# Patient Record
Sex: Male | Born: 1995 | Race: White | Hispanic: No | Marital: Single | Smoking: Current every day smoker
Health system: Southern US, Community
[De-identification: ages and names within clinical notes are randomized; demographics above are authoritative.]

## PROBLEM LIST (undated history)

## (undated) ENCOUNTER — Emergency Department (HOSPITAL_COMMUNITY): Payer: Self-pay

## (undated) ENCOUNTER — Other Ambulatory Visit (HOSPITAL_COMMUNITY): Admission: EM

## (undated) DIAGNOSIS — F1110 Opioid abuse, uncomplicated: Secondary | ICD-10-CM

## (undated) DIAGNOSIS — F1990 Other psychoactive substance use, unspecified, uncomplicated: Secondary | ICD-10-CM

## (undated) DIAGNOSIS — F1410 Cocaine abuse, uncomplicated: Secondary | ICD-10-CM

## (undated) DIAGNOSIS — F1910 Other psychoactive substance abuse, uncomplicated: Secondary | ICD-10-CM

## (undated) DIAGNOSIS — Z22322 Carrier or suspected carrier of Methicillin resistant Staphylococcus aureus: Secondary | ICD-10-CM

## (undated) DIAGNOSIS — Z720 Tobacco use: Secondary | ICD-10-CM

---

## 2017-03-05 ENCOUNTER — Emergency Department (HOSPITAL_COMMUNITY): Payer: Medicaid Other

## 2017-03-05 ENCOUNTER — Inpatient Hospital Stay (HOSPITAL_COMMUNITY)
Admission: EM | Admit: 2017-03-05 | Discharge: 2017-03-06 | DRG: 872 | Discharge status: Against Medical Advice | Payer: Medicaid Other | Attending: Internal Medicine | Admitting: Internal Medicine

## 2017-03-05 ENCOUNTER — Encounter (HOSPITAL_COMMUNITY): Payer: Self-pay | Admitting: Emergency Medicine

## 2017-03-05 DIAGNOSIS — F1410 Cocaine abuse, uncomplicated: Secondary | ICD-10-CM | POA: Diagnosis present

## 2017-03-05 DIAGNOSIS — M60009 Infective myositis, unspecified site: Secondary | ICD-10-CM | POA: Diagnosis present

## 2017-03-05 DIAGNOSIS — F1110 Opioid abuse, uncomplicated: Secondary | ICD-10-CM | POA: Diagnosis present

## 2017-03-05 DIAGNOSIS — L02213 Cutaneous abscess of chest wall: Secondary | ICD-10-CM

## 2017-03-05 DIAGNOSIS — Z720 Tobacco use: Secondary | ICD-10-CM

## 2017-03-05 DIAGNOSIS — F1990 Other psychoactive substance use, unspecified, uncomplicated: Secondary | ICD-10-CM | POA: Diagnosis present

## 2017-03-05 DIAGNOSIS — F419 Anxiety disorder, unspecified: Secondary | ICD-10-CM | POA: Diagnosis present

## 2017-03-05 DIAGNOSIS — R6510 Systemic inflammatory response syndrome (SIRS) of non-infectious origin without acute organ dysfunction: Secondary | ICD-10-CM | POA: Diagnosis present

## 2017-03-05 DIAGNOSIS — M6008 Infective myositis, other site: Secondary | ICD-10-CM | POA: Diagnosis present

## 2017-03-05 DIAGNOSIS — Z833 Family history of diabetes mellitus: Secondary | ICD-10-CM

## 2017-03-05 DIAGNOSIS — A419 Sepsis, unspecified organism: Principal | ICD-10-CM | POA: Diagnosis present

## 2017-03-05 DIAGNOSIS — R911 Solitary pulmonary nodule: Secondary | ICD-10-CM | POA: Diagnosis present

## 2017-03-05 DIAGNOSIS — Z8249 Family history of ischemic heart disease and other diseases of the circulatory system: Secondary | ICD-10-CM

## 2017-03-05 DIAGNOSIS — F17200 Nicotine dependence, unspecified, uncomplicated: Secondary | ICD-10-CM | POA: Diagnosis present

## 2017-03-05 DIAGNOSIS — L03313 Cellulitis of chest wall: Secondary | ICD-10-CM | POA: Diagnosis present

## 2017-03-05 HISTORY — DX: Other psychoactive substance use, unspecified, uncomplicated: F19.90

## 2017-03-05 HISTORY — DX: Tobacco use: Z72.0

## 2017-03-05 HISTORY — DX: Cocaine abuse, uncomplicated: F14.10

## 2017-03-05 HISTORY — DX: Opioid abuse, uncomplicated: F11.10

## 2017-03-05 LAB — COMPREHENSIVE METABOLIC PANEL
ALBUMIN: 3.6 g/dL (ref 3.5–5.0)
ALK PHOS: 85 U/L (ref 38–126)
ALT: 12 U/L — AB (ref 17–63)
AST: 13 U/L — AB (ref 15–41)
Anion gap: 13 (ref 5–15)
BUN: 6 mg/dL (ref 6–20)
CALCIUM: 8.6 mg/dL — AB (ref 8.9–10.3)
CHLORIDE: 96 mmol/L — AB (ref 101–111)
CO2: 24 mmol/L (ref 22–32)
CREATININE: 0.65 mg/dL (ref 0.61–1.24)
GFR calc non Af Amer: >60 mL/min (ref >60)
Glucose, Bld: 154 mg/dL — ABNORMAL HIGH (ref 65–99)
Potassium: 4.2 mmol/L (ref 3.5–5.1)
SODIUM: 133 mmol/L — AB (ref 135–145)
Total Bilirubin: 0.5 mg/dL (ref 0.3–1.2)
Total Protein: 7.5 g/dL (ref 6.5–8.1)

## 2017-03-05 LAB — RAPID URINE DRUG SCREEN, HOSP PERFORMED
Amphetamines: NOT DETECTED
BARBITURATES: NOT DETECTED
BENZODIAZEPINES: NOT DETECTED
COCAINE: POSITIVE — AB
OPIATES: NOT DETECTED
TETRAHYDROCANNABINOL: NOT DETECTED

## 2017-03-05 LAB — URINALYSIS, ROUTINE W REFLEX MICROSCOPIC
BACTERIA UA: NONE SEEN
BILIRUBIN URINE: NEGATIVE
Glucose, UA: NEGATIVE mg/dL
KETONES UR: NEGATIVE mg/dL
LEUKOCYTES UA: NEGATIVE
Nitrite: NEGATIVE
PH: 6 (ref 5.0–8.0)
Protein, ur: NEGATIVE mg/dL
SPECIFIC GRAVITY, URINE: 1.017 (ref 1.005–1.030)
SQUAMOUS EPITHELIAL / LPF: NONE SEEN

## 2017-03-05 LAB — CBC WITH DIFFERENTIAL/PLATELET
Basophils Absolute: 0 10*3/uL (ref 0.0–0.1)
Basophils Relative: 0 %
EOS ABS: 0.1 10*3/uL (ref 0.0–0.7)
EOS PCT: 1 %
HCT: 38.1 % — ABNORMAL LOW (ref 39.0–52.0)
Hemoglobin: 12.9 g/dL — ABNORMAL LOW (ref 13.0–17.0)
LYMPHS ABS: 1.4 10*3/uL (ref 0.7–4.0)
LYMPHS PCT: 9 %
MCH: 29.3 pg (ref 26.0–34.0)
MCHC: 33.9 g/dL (ref 30.0–36.0)
MCV: 86.4 fL (ref 78.0–100.0)
MONO ABS: 1.2 10*3/uL — AB (ref 0.1–1.0)
MONOS PCT: 7 %
Neutro Abs: 13.1 10*3/uL — ABNORMAL HIGH (ref 1.7–7.7)
Neutrophils Relative %: 83 %
PLATELETS: 237 10*3/uL (ref 150–400)
RBC: 4.41 MIL/uL (ref 4.22–5.81)
RDW: 12.7 % (ref 11.5–15.5)
WBC: 15.8 10*3/uL — ABNORMAL HIGH (ref 4.0–10.5)

## 2017-03-05 LAB — ETHANOL: Alcohol, Ethyl (B): <10 mg/dL (ref <10)

## 2017-03-05 LAB — I-STAT CG4 LACTIC ACID, ED: LACTIC ACID, VENOUS: 1.45 mmol/L (ref 0.5–1.9)

## 2017-03-05 MED ORDER — IOPAMIDOL (ISOVUE-300) INJECTION 61%
INTRAVENOUS | Status: AC
  Administered 2017-03-06: 75 mL via INTRAVENOUS
  Filled 2017-03-05: qty 75

## 2017-03-05 MED ORDER — KETOROLAC TROMETHAMINE 30 MG/ML IJ SOLN
30.0000 mg | Freq: Once | INTRAMUSCULAR | Status: AC
  Administered 2017-03-05: 30 mg via INTRAMUSCULAR
  Filled 2017-03-05: qty 1

## 2017-03-05 MED ORDER — KETOROLAC TROMETHAMINE 30 MG/ML IJ SOLN: 30.0000 mg | Freq: Once | INTRAMUSCULAR | Status: DC

## 2017-03-05 MED ORDER — HYDROMORPHONE HCL 1 MG/ML IJ SOLN
1.0000 mg | Freq: Once | INTRAMUSCULAR | Status: AC
  Administered 2017-03-05: 1 mg via INTRAVENOUS
  Filled 2017-03-05: qty 1

## 2017-03-05 MED ORDER — SODIUM CHLORIDE 0.9 % IV BOLUS (SEPSIS)
1000.0000 mL | Freq: Once | INTRAVENOUS | Status: AC
  Administered 2017-03-05: 1000 mL via INTRAVENOUS

## 2017-03-05 MED ORDER — SODIUM CHLORIDE 0.9 % IV BOLUS (SEPSIS)
1000.0000 mL | Freq: Once | INTRAVENOUS | Status: AC
  Administered 2017-03-06: 1000 mL via INTRAVENOUS

## 2017-03-05 MED ORDER — ACETAMINOPHEN 325 MG PO TABS
650.0000 mg | ORAL_TABLET | Freq: Once | ORAL | Status: AC
  Administered 2017-03-05: 650 mg via ORAL
  Filled 2017-03-05: qty 2

## 2017-03-05 NOTE — ED Provider Notes (Signed)
Left chest swelling, painful IVDA, last was PTA Toradol while here No fever, 99.9 Leukocytosis of 15  Pending CT chest with cm Has received 1 mg Dilaudid If negative, home with ibuprofen Referral to PCP Doxy or Clinda to go home with  CT results c/w early intramuscular abscess left pectoralis and cellulitis of left chest wall. Discussed the results with the patient. Vancomycin ordered. The patient has refused blood culture draw, "I don't want to get stuck anymore".   Discussed admission with [facility], Dr. Blaine Hamper, who accepts the patient onto his service.     Charlann Lange, PA-C 03/06/17 Margaretha Glassing    Charlesetta Shanks, MD 03/16/17 [phone]

## 2017-03-05 NOTE — ED Notes (Signed)
This nurse attempted x2 PIV sticks and was unsuccessful. Patient refuses to let this nurse stick again. EDPA notified. Ultrasound IV team consulted.

## 2017-03-05 NOTE — ED Notes (Signed)
Patient angry and yelling in his room. This nurse heard patient stating "theyre being assholes! Im in pain and they damnwell know it and theyre not giving me any pain meds!"

## 2017-03-05 NOTE — ED Notes (Signed)
EDPA at patient's bedside.

## 2017-03-05 NOTE — ED Provider Notes (Signed)
[facility] DEPT Provider Note   CSN: [id number] Arrival date & time: 03/05/17  1603     History   Chief Complaint Chief Complaint  Patient presents with  . pulled muscle/muscle pain    HPI Jonathen Rathman is a 21 y.o. male presenting with 2 days of left-sided pectoral swelling, erythema and pain. Patient is a active heroine IV drug user with last use an hour prior to arrival. He reports subjective fevers and chills and progressive worsening of the swelling and pain. He denies any trauma or injury to the area. No cold symptoms, cough, nausea, vomiting or any other symptoms. denies any trauma or injury to the area.  HPI  No past medical history on file.  There are no active problems to display for this patient.   No past surgical history on file.     Home Medications    Prior to Admission medications   Medication Sig Start Date End Date Taking? Authorizing Provider  naproxen sodium (ANAPROX) 220 MG tablet Take 880 mg by mouth 2 (two) times daily with a meal.   Yes [provider]    Family History No family history on file.  Social History Social History  Substance Use Topics  . Smoking status: Not on file  . Smokeless tobacco: Not on file  . Alcohol use Not on file     Allergies   Penicillins   Review of Systems Review of Systems  Constitutional: Positive for chills and fever.  HENT: Negative for congestion.   Respiratory: Negative for cough, choking, chest tightness, shortness of breath, wheezing and stridor.   Cardiovascular: Positive for chest pain. Negative for palpitations.       Left-sided chest wall pain  Gastrointestinal: Negative for nausea and vomiting.  Musculoskeletal: Positive for myalgias.  Skin: Positive for color change. Negative for pallor, rash and wound.       Erythema and swelling to the left pectoral area  Psychiatric/Behavioral: Positive for agitation.     Physical Exam Updated Vital  Signs BP 135/74 (BP Location: Left Arm)   Pulse (!) 103   Temp 99.9 F (37.7 C) (Oral)   Resp 20   Ht 5\' 4"  (1.626 m)   SpO2 99%   Physical Exam  Constitutional: He appears well-developed and well-nourished. No distress.  Patient was agitated and screaming at nursing staff in the hallway. He was calmer and cooperative on my assessment. Is afebrile nontoxic  HENT:  Head: Normocephalic and atraumatic.  Eyes: Conjunctivae and EOM are normal.  Neck: Normal range of motion.  Cardiovascular: Normal rate, regular rhythm, normal heart sounds and intact distal pulses.   No murmur heard. Pulmonary/Chest: Effort normal and breath sounds normal. No respiratory distress. He has no wheezes. He has no rales. He exhibits tenderness.  Tenderness palpation of the left chest wall and sternum, no crepitus appreciated  Musculoskeletal: Normal range of motion. He exhibits edema.  Neurological: He is alert.  Skin: Skin is warm and dry. He is not diaphoretic. There is erythema.  Warmth, erythema and edema to the left chest wall, no central area of fluctuance suggesting a superficial abscess amenable to I&D.  Psychiatric: He has a normal mood and affect.  Nursing note and vitals reviewed.    ED Treatments / Results  Labs (all labs ordered are listed, but only abnormal results are displayed) Labs Reviewed  COMPREHENSIVE METABOLIC PANEL - Abnormal; Notable for the following:       Result Value   Sodium  133 (*)    Chloride 96 (*)    Glucose, Bld 154 (*)    Calcium 8.6 (*)    AST 13 (*)    ALT 12 (*)    All other components within normal limits  CBC WITH DIFFERENTIAL/PLATELET - Abnormal; Notable for the following:    WBC 15.8 (*)    Hemoglobin 12.9 (*)    HCT 38.1 (*)    Neutro Abs 13.1 (*)    Monocytes Absolute 1.2 (*)    All other components within normal limits  URINALYSIS, ROUTINE W REFLEX MICROSCOPIC - Abnormal; Notable for the following:    Hgb urine dipstick LARGE (*)    All other  components within normal limits  RAPID URINE DRUG SCREEN, HOSP PERFORMED - Abnormal; Notable for the following:    Cocaine POSITIVE (*)    All other components within normal limits  CULTURE, BLOOD (ROUTINE X 2)  CULTURE, BLOOD (ROUTINE X 2)  ETHANOL  I-STAT CG4 LACTIC ACID, ED    EKG  EKG Interpretation None       Radiology Dg Chest 2 View  Result Date: 03/05/2017 CLINICAL DATA:  Chest pain EXAM: CHEST  2 VIEW COMPARISON:  None. FINDINGS: Lungs are clear. Heart size and pulmonary vascularity are normal. No adenopathy. No pneumothorax. No bone lesions. IMPRESSION: No edema or consolidation. Electronically Signed   By: Lowella Grip III M.D.   On: 03/05/2017 19:09   Dg Sternum  Result Date: 03/05/2017 CLINICAL DATA:  Pain after twisting injury.  Heroin user. EXAM: STERNUM - 1+ VIEW COMPARISON:  Chest radiograph of earlier today. FINDINGS: Single lateral view of the sternum demonstrates no focal osseous abnormality. No retrosternal soft tissue mass. No subcutaneous gas or osseous destruction. IMPRESSION: No acute osseous abnormality. Electronically Signed   By: Abigail Miyamoto M.D.   On: 03/05/2017 19:28    Procedures Procedures (including critical care time)  Medications Ordered in ED Medications  iopamidol (ISOVUE-300) 61 % injection (not administered)  sodium chloride 0.9 % bolus 1,000 mL (not administered)  ketorolac (TORADOL) 30 MG/ML injection 30 mg (30 mg Intramuscular Given 03/05/17 1921)  HYDROmorphone (DILAUDID) injection 1 mg (1 mg Intravenous Given 03/05/17 2328)  sodium chloride 0.9 % bolus 1,000 mL (1,000 mLs Intravenous New Bag/Given 03/05/17 2327)  acetaminophen (TYLENOL) tablet 650 mg (650 mg Oral Given 03/05/17 2343)     Initial Impression / Assessment and Plan / ED Course  I have reviewed the triage vital signs and the nursing notes.  Pertinent labs & imaging results that were available during my care of the patient were reviewed by me and considered in  my medical decision making (see chart for details).    Patient presenting with 2 days of left pectoral pain, swelling and erythema. He is agited and screaming at staff. Requesting IV analgesia  Obtain labs, chest x-ray and reevaluate Patient was given Toradol IM and reported improvement  Negative chest and sternum films  Leukocytosis at 15.8. Discussed with Dr. Johnney Killian and CT chest with contrast ordered. Patient started to experience pain recurrence and was provided analgesia. Patient was discussed with Dr. Johnney Killian who has also evaluated patient and agrees with assessment and plan.  Transferred patient care at end of shift to Charlann Lange, PA-C pending CT results and disposition. Dissipate discharge home with antibiotics and ibuprofen if CT negative. Treat as appropriate if significant findings on CT.  Final Clinical Impressions(s) / ED Diagnoses   Final diagnoses:  None    New Prescriptions New  Prescriptions   No medications on file     Dossie Der 03/05/17 [place], Icard, MD 03/16/17 [phone]

## 2017-03-05 NOTE — ED Notes (Signed)
Patient called this nurse into the room, screaming, stating "Im in pain and you all arent doing a damn thing about it! Give me pain meds already!" This nurse explained the process of the ER, and stated the EDPA would be seeing the patient as soon as she was able, but the patient remains agitated. Throwing his blanket on the floor, patient screams and yells at this nurse to "Get the fuck out of my room!"

## 2017-03-05 NOTE — ED Notes (Signed)
This nurse heard patient screaming in his room and entered the room. Patient screams, "Give me a phone! Im calling a damn lawyer right now! This shit is insane! Yall just need to give me some pain meds and you wont even do that! I hate yall!" This nurse reminded the patient that he could not be screaming, as he was not the only patient in this area of the ER and that he was disturbing other patients. The patient then screams "I dont fucking care! They dont hurt as bad as I do! They dont fucking know what Im going through! They can fuck off!" The patient asked for pain medication again, and this nurse reminded the patient that I cannot order pain medications for the patient without a physician order. The patient again got very agitated and screamed for this nurse to "Get out! I fucking hate yall!"

## 2017-03-05 NOTE — ED Triage Notes (Signed)
Per EMS-states he threw an orange and is now having right pectorial pain and inflammation he is a heroin user and last use was over a hour ago

## 2017-03-06 ENCOUNTER — Encounter (HOSPITAL_COMMUNITY): Payer: Self-pay

## 2017-03-06 DIAGNOSIS — F1110 Opioid abuse, uncomplicated: Secondary | ICD-10-CM | POA: Diagnosis present

## 2017-03-06 DIAGNOSIS — L02213 Cutaneous abscess of chest wall: Secondary | ICD-10-CM | POA: Diagnosis present

## 2017-03-06 DIAGNOSIS — F419 Anxiety disorder, unspecified: Secondary | ICD-10-CM | POA: Diagnosis present

## 2017-03-06 DIAGNOSIS — F1990 Other psychoactive substance use, unspecified, uncomplicated: Secondary | ICD-10-CM | POA: Diagnosis not present

## 2017-03-06 DIAGNOSIS — M60009 Infective myositis, unspecified site: Secondary | ICD-10-CM | POA: Diagnosis present

## 2017-03-06 DIAGNOSIS — R6510 Systemic inflammatory response syndrome (SIRS) of non-infectious origin without acute organ dysfunction: Secondary | ICD-10-CM | POA: Diagnosis present

## 2017-03-06 DIAGNOSIS — Z833 Family history of diabetes mellitus: Secondary | ICD-10-CM | POA: Diagnosis not present

## 2017-03-06 DIAGNOSIS — R911 Solitary pulmonary nodule: Secondary | ICD-10-CM | POA: Diagnosis present

## 2017-03-06 DIAGNOSIS — F1410 Cocaine abuse, uncomplicated: Secondary | ICD-10-CM | POA: Diagnosis present

## 2017-03-06 DIAGNOSIS — A419 Sepsis, unspecified organism: Principal | ICD-10-CM

## 2017-03-06 DIAGNOSIS — M6008 Infective myositis, other site: Secondary | ICD-10-CM | POA: Diagnosis present

## 2017-03-06 DIAGNOSIS — F17200 Nicotine dependence, unspecified, uncomplicated: Secondary | ICD-10-CM | POA: Diagnosis present

## 2017-03-06 DIAGNOSIS — L03313 Cellulitis of chest wall: Secondary | ICD-10-CM | POA: Diagnosis present

## 2017-03-06 DIAGNOSIS — Z720 Tobacco use: Secondary | ICD-10-CM | POA: Diagnosis present

## 2017-03-06 DIAGNOSIS — Z8249 Family history of ischemic heart disease and other diseases of the circulatory system: Secondary | ICD-10-CM | POA: Diagnosis not present

## 2017-03-06 DIAGNOSIS — R509 Fever, unspecified: Secondary | ICD-10-CM | POA: Diagnosis present

## 2017-03-06 LAB — BASIC METABOLIC PANEL
Anion gap: 9 (ref 5–15)
CALCIUM: 8.3 mg/dL — AB (ref 8.9–10.3)
CO2: 24 mmol/L (ref 22–32)
Chloride: 102 mmol/L (ref 101–111)
Creatinine, Ser: 0.6 mg/dL — ABNORMAL LOW (ref 0.61–1.24)
GFR calc Af Amer: >60 mL/min (ref >60)
GLUCOSE: 125 mg/dL — AB (ref 65–99)
Potassium: 4.3 mmol/L (ref 3.5–5.1)
Sodium: 135 mmol/L (ref 135–145)

## 2017-03-06 MED ORDER — HYDROXYZINE HCL 25 MG PO TABS
25.0000 mg | ORAL_TABLET | Freq: Four times a day (QID) | ORAL | Status: DC | PRN
  Administered 2017-03-06: 25 mg via ORAL
  Filled 2017-03-06: qty 1

## 2017-03-06 MED ORDER — VANCOMYCIN HCL IN DEXTROSE 1-5 GM/200ML-% IV SOLN
1000.0000 mg | Freq: Once | INTRAVENOUS | Status: AC
  Administered 2017-03-06: 1000 mg via INTRAVENOUS
  Filled 2017-03-06: qty 200

## 2017-03-06 MED ORDER — LORAZEPAM 2 MG/ML IJ SOLN
0.5000 mg | Freq: Four times a day (QID) | INTRAMUSCULAR | Status: DC | PRN
  Administered 2017-03-06: 0.5 mg via INTRAVENOUS
  Filled 2017-03-06: qty 1

## 2017-03-06 MED ORDER — IOPAMIDOL (ISOVUE-300) INJECTION 61%
75.0000 mL | Freq: Once | INTRAVENOUS | Status: AC | PRN
  Administered 2017-03-06: 75 mL via INTRAVENOUS

## 2017-03-06 MED ORDER — VANCOMYCIN HCL IN DEXTROSE 1-5 GM/200ML-% IV SOLN
1000.0000 mg | Freq: Three times a day (TID) | INTRAVENOUS | Status: DC
  Administered 2017-03-06: 1000 mg via INTRAVENOUS
  Filled 2017-03-06: qty 200

## 2017-03-06 MED ORDER — DEXTROSE 5 % IV SOLN
2.0000 g | Freq: Three times a day (TID) | INTRAVENOUS | Status: DC
  Administered 2017-03-06: 2 g via INTRAVENOUS
  Filled 2017-03-06 (×2): qty 2

## 2017-03-06 MED ORDER — KETOROLAC TROMETHAMINE 30 MG/ML IJ SOLN
30.0000 mg | Freq: Four times a day (QID) | INTRAMUSCULAR | Status: DC | PRN
  Administered 2017-03-06 (×3): 30 mg via INTRAVENOUS
  Filled 2017-03-06 (×3): qty 1

## 2017-03-06 MED ORDER — AZTREONAM 2 G IJ SOLR
2.0000 g | Freq: Once | INTRAMUSCULAR | Status: AC
  Administered 2017-03-06: 2 g via INTRAVENOUS
  Filled 2017-03-06: qty 2

## 2017-03-06 MED ORDER — NICOTINE 21 MG/24HR TD PT24: 21.0000 mg | MEDICATED_PATCH | Freq: Every day | TRANSDERMAL | Status: DC

## 2017-03-06 MED ORDER — MORPHINE SULFATE (PF) 4 MG/ML IV SOLN
INTRAVENOUS | Status: AC
  Filled 2017-03-06: qty 1

## 2017-03-06 MED ORDER — ACETAMINOPHEN 650 MG RE SUPP: 650.0000 mg | Freq: Four times a day (QID) | RECTAL | Status: DC | PRN

## 2017-03-06 MED ORDER — OXYCODONE-ACETAMINOPHEN 5-325 MG PO TABS
1.0000 | ORAL_TABLET | ORAL | Status: DC | PRN
  Administered 2017-03-06 (×2): 1 via ORAL
  Filled 2017-03-06 (×2): qty 1

## 2017-03-06 MED ORDER — SULFAMETHOXAZOLE-TRIMETHOPRIM 800-160 MG PO TABS: 1.0000 | ORAL_TABLET | Freq: Two times a day (BID) | ORAL | 0 refills | Status: DC

## 2017-03-06 MED ORDER — ACETAMINOPHEN 325 MG PO TABS: 650.0000 mg | ORAL_TABLET | Freq: Four times a day (QID) | ORAL | Status: DC | PRN

## 2017-03-06 MED ORDER — MORPHINE SULFATE (PF) 4 MG/ML IV SOLN
4.0000 mg | Freq: Once | INTRAVENOUS | Status: AC
  Administered 2017-03-06: 4 mg via INTRAVENOUS

## 2017-03-06 MED ORDER — ZOLPIDEM TARTRATE 5 MG PO TABS: 5.0000 mg | ORAL_TABLET | Freq: Every evening | ORAL | Status: DC | PRN

## 2017-03-06 MED ORDER — ONDANSETRON HCL 4 MG/2ML IJ SOLN: 4.0000 mg | Freq: Three times a day (TID) | INTRAMUSCULAR | Status: DC | PRN

## 2017-03-06 MED ORDER — ENOXAPARIN SODIUM 40 MG/0.4ML ~~LOC~~ SOLN
40.0000 mg | SUBCUTANEOUS | Status: DC
  Filled 2017-03-06: qty 0.4

## 2017-03-06 MED ORDER — SODIUM CHLORIDE 0.9 % IV SOLN
INTRAVENOUS | Status: DC
  Administered 2017-03-06 (×3): via INTRAVENOUS

## 2017-03-06 MED ORDER — HYDROMORPHONE HCL 1 MG/ML IJ SOLN
1.0000 mg | Freq: Once | INTRAMUSCULAR | Status: DC
  Administered 2017-03-06: 1 mg via INTRAVENOUS
  Filled 2017-03-06: qty 1

## 2017-03-06 NOTE — Progress Notes (Signed)
Pharmacy Antibiotic Note  Joseph Nunez is a 21 y.o. male with 2 days of left-sided pectoral swelling, erythema and pain admitted on 03/05/2017 with sepsis d/t abscess/cellulitis Pharmacy has been consulted for azactam and vancomycin dosing.  Plan: Azactam 2 Gm IV q8h Vancomycin 1 Gm IV q8h F/u scr/cultures/levels  Height: 5\' 4"  (162.6 cm) IBW/kg (Calculated) : 59.2  Temp (24hrs), Avg:99.9 F (37.7 C), Min:99.9 F (37.7 C), Max:99.9 F (37.7 C)   Recent Labs Lab 03/05/17 2015 03/05/17 2025  WBC 15.8*  --   CREATININE 0.65  --   LATICACIDVEN  --  1.45    CrCl cannot be calculated (Unknown ideal weight.).    Allergies  Allergen Reactions  . Penicillins     Has patient had a PCN reaction causing immediate rash, facial/tongue/throat swelling, SOB or lightheadedness with hypotension:yHas patient had a PCN reace cassing severe rash involving mucus membranes or skin necrosis: no Has patient had a PCN reaction that required hospitalization yes Has patient had a PCN reaction occurring within the last 10 years: no If all of the above answers are "NO", then may proceed with Cephalosporin use.     Antimicrobials this admission: 10/17 azactam >>  10/17 vancomycin >>   Dose adjustments this admission:   Microbiology results:  BCx:   UCx:    Sputum:    MRSA PCR:   Thank you for allowing pharmacy to be a part of this patient's care.  Dorrene German 03/06/2017 2:02 AM

## 2017-03-06 NOTE — ED Notes (Signed)
Pt given apple juice and orange juice  ED provider in talking with pt

## 2017-03-06 NOTE — ED Notes (Signed)
Pt still requesting more pain medication. Wants pain medication every hour. Have spoken with hospitalist about this request. Pt was given one time dose of morphine for pain. I have offered ativan on multiple occasions this am, but pt has refused every time. Informed pt that tordol and percocet can be given every 6 hours PRN. Have informed pt on multiple occasions that next dose of tordol can be given at 0930.

## 2017-03-06 NOTE — Progress Notes (Signed)
PROGRESS NOTE                                                                                                                                                                                                             Patient Demographics:    Joseph Nunez, is a 21 y.o. male, DOB - Jul 02, 1995, FHQ:[id number]  Admit date - 03/05/2017   Admitting Physician Ivor Costa, MD  Outpatient Primary MD for the patient is Patient, No Pcp Per  LOS - 0   Chief Complaint  Patient presents with  . pulled muscle/muscle pain       Brief Narrative   This is in a charge note for patient admitted earlier today by Dr. Soledad Gerlach   Subjective:    Oletha Blend today has, No headache, complains of left chest wall pain    Assessment  & Plan :    Principal Problem:   Cellulitis of pectoral region Active Problems:   Heroin abuse ([place])   IVDU (intravenous drug user)   Sepsis ([place])   Pyomyositis   Cocaine abuse ([place])   Tobacco abuse    Sepsis due to cellulitis and pyomyositis  of pectoral region:  - patient admits criteria for sepsis with fever, leukocytosis, tachycardia and tachypnea.lactic acid is normal. Hemodynamically stable. -Patient is IV drug user, will needs antibiotics with broad coverage.continue with IV vancomycin and aztreonam, continue to follow blood cultures, continue with IV fluids - follow on ESR and CRP -check HIV  ab  Polysubstance abuse: -patient reports heroin use, but urine drug screen is positive for cocaine, he reports history of IV drug abuse, he was counseled - He was started on nicotine patch, patient started smoking when he got into his room on the fourth floor, was counseled and educated -When necessary Ativan And atarax  for anxiety  4 mm left upper lobe subpleural nodule:   -Patient has this incidental finding on CT of the chest -follow-up with PCP    Code Status : Full  Family Communication  : None at  bedside  Disposition Plan  : Home when stable  Consults  :  none  Procedures  : none  DVT Prophylaxis  :  Lovenox  Lab Results  Component Value Date   PLT 237 03/05/2017    Antibiotics  :    Anti-infectives  Start     Dose/Rate Route Frequency Ordered Stop   03/06/17 1000  vancomycin (VANCOCIN) IVPB 1000 mg/200 mL premix     1,000 mg 200 mL/hr over 60 Minutes Intravenous Every 8 hours 03/06/17 0315     03/06/17 1000  aztreonam (AZACTAM) 2 g in dextrose 5 % 50 mL IVPB     2 g 100 mL/hr over 30 Minutes Intravenous Every 8 hours 03/06/17 0317     03/06/17 0200  aztreonam (AZACTAM) 2 g in dextrose 5 % 50 mL IVPB     2 g 100 mL/hr over 30 Minutes Intravenous  Once 03/06/17 0158 03/06/17 0345   03/06/17 0130  vancomycin (VANCOCIN) IVPB 1000 mg/200 mL premix     1,000 mg 200 mL/hr over 60 Minutes Intravenous  Once 03/06/17 0116 03/06/17 0312        Objective:   Vitals:   03/06/17 1034 03/06/17 1130 03/06/17 1212 03/06/17 1421  BP:   113/86 119/75  Pulse:   (!) 110 (!) 101  Resp:  (!) 28 (!) 22 18  Temp: 99.4 F (37.4 C)     TempSrc: Oral   Oral  SpO2:   100% 100%  Weight:      Height:        Wt Readings from Last 3 Encounters:  03/06/17 65.8 kg (145 lb)     Intake/Output Summary (Last 24 hours) at 03/06/17 1533 Last data filed at 03/06/17 1034  Gross per 24 hour  Intake              200 ml  Output              600 ml  Net             -400 ml     Physical Exam  Awake Alert, Oriented X 3 Symmetrical Chest wall movement, Good air movement bilaterally, CTAB RRR,No Gallops,Rubs or new Murmurs, No Parasternal Heave, minimal erythema and swelling in the left upper chest wall +ve B.Sounds, Abd Soft, No tenderness,No rebound - guarding or rigidity. No Cyanosis, Clubbing or edema, No new Rash or bruise     Data Review:    CBC  Recent Labs Lab 03/05/17 2015  WBC 15.8*  HGB 12.9*  HCT 38.1*  PLT 237  MCV 86.4  MCH 29.3  MCHC 33.9  RDW 12.7   LYMPHSABS 1.4  MONOABS 1.2*  EOSABS 0.1  BASOSABS 0.0    Chemistries   Recent Labs Lab 03/05/17 2015 03/06/17 0515  NA 133* 135  K 4.2 4.3  CL 96* 102  CO2 24 24  GLUCOSE 154* 125*  BUN 6 <5*  CREATININE 0.65 0.60*  CALCIUM 8.6* 8.3*  AST 13*  --   ALT 12*  --   ALKPHOS 85  --   BILITOT 0.5  --    ------------------------------------------------------------------------------------------------------------------ No results for input(s): CHOL, HDL, LDLCALC, TRIG, CHOLHDL, LDLDIRECT in the last 72 hours.  No results found for: HGBA1C ------------------------------------------------------------------------------------------------------------------ No results for input(s): TSH, T4TOTAL, T3FREE, THYROIDAB in the last 72 hours.  Invalid input(s): FREET3 ------------------------------------------------------------------------------------------------------------------ No results for input(s): VITAMINB12, FOLATE, FERRITIN, TIBC, IRON, RETICCTPCT in the last 72 hours.  Coagulation profile No results for input(s): INR, PROTIME in the last 168 hours.  No results for input(s): DDIMER in the last 72 hours.  Cardiac Enzymes No results for input(s): CKMB, TROPONINI, MYOGLOBIN in the last 168 hours.  Invalid input(s): CK ------------------------------------------------------------------------------------------------------------------ No results found for: BNP  Inpatient Medications  Scheduled Meds: .  enoxaparin (LOVENOX) injection  40 mg Subcutaneous Q24H  . morphine      . nicotine  21 mg Transdermal Daily   Continuous Infusions: . sodium chloride 150 mL/hr at 03/06/17 1431  . aztreonam    . vancomycin Stopped (03/06/17 1034)   PRN Meds:.acetaminophen **OR** acetaminophen, hydrOXYzine, ketorolac, LORazepam, ondansetron, oxyCODONE-acetaminophen, zolpidem  Micro Results No results found for this or any previous visit (from the past 240 hour(s)).  Radiology Reports Dg  Chest 2 View  Result Date: 03/05/2017 CLINICAL DATA:  Chest pain EXAM: CHEST  2 VIEW COMPARISON:  None. FINDINGS: Lungs are clear. Heart size and pulmonary vascularity are normal. No adenopathy. No pneumothorax. No bone lesions. IMPRESSION: No edema or consolidation. Electronically Signed   By: Lowella Grip III M.D.   On: 03/05/2017 19:09   Dg Sternum  Result Date: 03/05/2017 CLINICAL DATA:  Pain after twisting injury.  Heroin user. EXAM: STERNUM - 1+ VIEW COMPARISON:  Chest radiograph of earlier today. FINDINGS: Single lateral view of the sternum demonstrates no focal osseous abnormality. No retrosternal soft tissue mass. No subcutaneous gas or osseous destruction. IMPRESSION: No acute osseous abnormality. Electronically Signed   By: Abigail Miyamoto M.D.   On: 03/05/2017 19:28   Ct Chest W Contrast  Result Date: 03/06/2017 CLINICAL DATA:  Patient developed pectoral pain on the right after throwing an orange. Leukocytosis. EXAM: CT CHEST WITH CONTRAST TECHNIQUE: Multidetector CT imaging of the chest was performed during intravenous contrast administration. CONTRAST:  75 cc Isovue-300 COMPARISON:  None. FINDINGS: Cardiovascular: Normal heart size without pericardial effusion. Normal ao aortic caliber. No large central pulmonary embolus though the study is not tailored for assessment of pulmonary emboli. Mediastinum/Nodes: No enlarged mediastinal, hilar, or axillary lymph nodes. Thyroid gland, trachea, and esophagus demonstrate no significant findings. Lungs/Pleura: 4 mm left upper lobe subpleural nodule. No pneumonic consolidation, effusion or pneumothorax. Upper Abdomen: No acute abnormality. Musculoskeletal: Asymmetric enlargement with subtle intramuscular heterogeneous enhancement of the left pectoralis muscle relative to right. Findings are suspicious for pyomyositis. No intramuscular hemorrhage. There is overlying soft tissue induration and inflammation though the anterior chest wall extending to  the upper abdomen. IMPRESSION: 1. Asymmetric enlargement of the left pectoralis muscle relative to right. There is subtle intramuscular enhancement suspicious for a developing pyomyositis of the left pectoralis. Overlying subcutaneous fatty induration is seen of the anterior chest wall that may reflect a cellulitis. These results were called by telephone at the time of interpretation on 03/06/2017 at 1:05 am to [place], who verbally acknowledged these results. 2. 4 mm left upper lobe subpleural nodule. No follow-up needed if patient is low-risk. Non-contrast chest CT can be considered in 12 months if patient is high-risk. This recommendation follows the consensus statement: Guidelines for Management of Incidental Pulmonary Nodules Detected on CT Images: From the Fleischner Society 2017; Radiology 2017; 284:228-243. Electronically Signed   By: Ashley Royalty M.D.   On: 03/06/2017 01:06    Time Spent in minutes  No charge   [facility], Sweet Jarvis M.D on 03/06/2017 at 3:33 PM  Between 7am to 7pm - Pager - [phone]  After 7pm go to [email or web address] - password [facility]  [facility] -  Office  [phone]

## 2017-03-06 NOTE — ED Notes (Signed)
Family at bedside. 

## 2017-03-06 NOTE — ED Notes (Signed)
Patient refusing to wear cardiac monitor and pulse ox. Patient also refusing IV fluids.

## 2017-03-06 NOTE — ED Notes (Signed)
Call report to Amber [phone] @ 1400.Stacey Drain

## 2017-03-06 NOTE — ED Notes (Signed)
Pt reports the pain medication is not helping and wants pain medication every hour. Also threatening to leave AMA he does not feel better. Will inform hospitalist.

## 2017-03-06 NOTE — Progress Notes (Signed)
Patient called for RN to come to room. Upon arrival pt reports biting IV tubing line. Blood in tubing. Pt was asked to not bite IV tuning line. Pt stated that he was nervous and needed to leave because pain was not being managed. Pt was encouraged to stay to receive IV antibiotics. Pt declined. Pt signed AMA form. Provider contacted, prescription given for antibiotics.

## 2017-03-06 NOTE — ED Notes (Signed)
Offered ativan for detox symptoms again, pt refused.

## 2017-03-06 NOTE — ED Notes (Signed)
Admitting dr in with pt

## 2017-03-06 NOTE — Progress Notes (Addendum)
Patient arrived to unit c/o 6/10 pain left pectoral.  Skin  Rt deltoid scar. Pt is alert, orient ambulatory without assist. Pt was oriented to room. Call bell/phone placed within reach. Pt went to use restroom. RN smelled smoke from hallway. RN provided education to patient regarding tobacco use in facility. Pt was asked to not smoke and made aware that items would be confiscated should episode tobacco use occur again. Pt verbalized understanding.

## 2017-03-06 NOTE — ED Notes (Signed)
Patient refused to let me collect blood cultures

## 2017-03-06 NOTE — ED Notes (Signed)
Patient requesting additional medication for "restlessness." Made aware of PRN order of Ativan q6 hours. Hospitalist paged.

## 2017-03-06 NOTE — Discharge Summary (Signed)
Joseph Nunez, is a 21 y.o. male  DOB 1996-02-27  MRN [id number].  Admission date:  03/05/2017  Admitting Physician  Ivor Costa, MD  Discharge Date:  03/06/2017   Primary MD  Patient, No Pcp Per   Patient left AMA, wassmokingin his room, he asking for IV pain medication, he understands risks of not being treated appropriately including sepsis and even death.  Recommendations for primary care physician for things to follow:  - please repeat workup, as well patient need further workup for incidental finding of pulmonary nodule.   Admission Diagnosis  Abscess of pectoral region [L02.213] Cellulitis of chest wall [L03.313] Tobacco abuse [Z72.0] IVDU (intravenous drug user) [F19.90]   Discharge Diagnosis  Abscess of pectoral region [L02.213] Cellulitis of chest wall [P23.300] Tobacco abuse [Z72.0] IVDU (intravenous drug user) [F19.90]    Principal Problem:   Cellulitis of pectoral region Active Problems:   Heroin abuse ([place])   IVDU (intravenous drug user)   Sepsis ([place])   Pyomyositis   Cocaine abuse ([place])   Tobacco abuse      Past Medical History:  Diagnosis Date  . Cocaine abuse ([place])   . Heroin abuse ([place])   . IVDU (intravenous drug user)   . Tobacco abuse     History reviewed. No pertinent surgical history.     History of present illness and  Hospital Course:     Kindly see H&P for history of present illness and admission details, please review complete Labs, Consult reports and Test reports for all details in brief  HPI  from the history and physical done on the day of admission 03/06/2017   HPI: Joseph Nunez is a 21 y.o. male with medical history significant of polysubstance abuse (heroin, tobacco, cocaine) and IV drug user, who presents with left chest wall pain and fever.  Pt states that he started having left chest wall pain 4 days ago. The area is also  swelling and erythema. The pain is constant, 10 out of 10 in severity, nonradiating. It is associated with subjective fever and chills. Patient denies shortness breath, no GI symptoms. Denies symptoms of UTI or unilateral weakness. Patient is an active heroine IV drug user with last use an hourprior to arrival to ED. He denies any trauma or injury to the area.  ED Course: pt was found to have WBC 15.8, lactic acid 1.45, positive urinalysis for cocaine, negative urinalysis, alcohol level less than 10, creatinine normal, temperature 99.9, tachycardia, tachypnea, oxygen saturation 98% on room air. Chest x-ray negative. Patient is admitted to telemetry bed as inpatient.  # CT of the chest showed:  1. Asymmetric enlargement of the left pectoralis muscle relative to right. There is subtle intramuscular enhancement suspicious for a developing pyomyositis of the left pectoralis. Overlying subcutaneous fatty induration is seen of the anterior chest wall that may reflect a cellulitis.   2. 4 mm left upper lobe subpleural nodule.    Hospital Course   Sepsis due to cellulitis and pyomyositis of pectoral region: - patient with  fever, leukocytosis, tachycardia and tachypnea.lactic acid is normal. Hemodynamically stable. -Patient is IV drug user,targeted on broad-spectrum antibiotic coverage vancomycin and aztreonam, blood cultures were sent, he was on IV fluids,. - Patient is adamant about leaving AMA, explained risks, he still wants to leave, so I have given him prescription for Bactrim DS one tablet twice a day for total of 10 days.   Polysubstance abuse: -patient reports heroin use, but urine drug screen is positive for cocaine, he reports history of IV drug abuse, he was counseled - He was started on nicotine patch, patient started smoking when he got into his room on the fourth floor, was counseled and educated   33mm left upper lobe subpleural nodule: -Patient has this incidental finding  on CT of the chest, Performed about this findings, -follow-up with PCP     Discharge Condition:  Left AMA    Discharge Instructions  and  Discharge Medications          Diet and Activity recommendation: See Discharge Instructions above      Major procedures and Radiology Reports - PLEASE review detailed and final reports for all details, in brief -      Dg Chest 2 View  Result Date: 03/05/2017 CLINICAL DATA:  Chest pain EXAM: CHEST  2 VIEW COMPARISON:  None. FINDINGS: Lungs are clear. Heart size and pulmonary vascularity are normal. No adenopathy. No pneumothorax. No bone lesions. IMPRESSION: No edema or consolidation. Electronically Signed   By: Lowella Grip III M.D.   On: 03/05/2017 19:09   Dg Sternum  Result Date: 03/05/2017 CLINICAL DATA:  Pain after twisting injury.  Heroin user. EXAM: STERNUM - 1+ VIEW COMPARISON:  Chest radiograph of earlier today. FINDINGS: Single lateral view of the sternum demonstrates no focal osseous abnormality. No retrosternal soft tissue mass. No subcutaneous gas or osseous destruction. IMPRESSION: No acute osseous abnormality. Electronically Signed   By: Abigail Miyamoto M.D.   On: 03/05/2017 19:28   Ct Chest W Contrast  Result Date: 03/06/2017 CLINICAL DATA:  Patient developed pectoral pain on the right after throwing an orange. Leukocytosis. EXAM: CT CHEST WITH CONTRAST TECHNIQUE: Multidetector CT imaging of the chest was performed during intravenous contrast administration. CONTRAST:  75 cc Isovue-300 COMPARISON:  None. FINDINGS: Cardiovascular: Normal heart size without pericardial effusion. Normal ao aortic caliber. No large central pulmonary embolus though the study is not tailored for assessment of pulmonary emboli. Mediastinum/Nodes: No enlarged mediastinal, hilar, or axillary lymph nodes. Thyroid gland, trachea, and esophagus demonstrate no significant findings. Lungs/Pleura: 4 mm left upper lobe subpleural nodule. No pneumonic  consolidation, effusion or pneumothorax. Upper Abdomen: No acute abnormality. Musculoskeletal: Asymmetric enlargement with subtle intramuscular heterogeneous enhancement of the left pectoralis muscle relative to right. Findings are suspicious for pyomyositis. No intramuscular hemorrhage. There is overlying soft tissue induration and inflammation though the anterior chest wall extending to the upper abdomen. IMPRESSION: 1. Asymmetric enlargement of the left pectoralis muscle relative to right. There is subtle intramuscular enhancement suspicious for a developing pyomyositis of the left pectoralis. Overlying subcutaneous fatty induration is seen of the anterior chest wall that may reflect a cellulitis. These results were called by telephone at the time of interpretation on 03/06/2017 at 1:05 am to [place], who verbally acknowledged these results. 2. 4 mm left upper lobe subpleural nodule. No follow-up needed if patient is low-risk. Non-contrast chest CT can be considered in 12 months if patient is high-risk. This recommendation follows the consensus statement: Guidelines for Management of Incidental  Pulmonary Nodules Detected on CT Images: From the Fleischner Society 2017; Radiology 2017; [phone]. Electronically Signed   By: Ashley Royalty M.D.   On: 03/06/2017 01:06    Micro Results     No results found for this or any previous visit (from the past 240 hour(s)).     Exam Left AMA  Data Review   CBC w Diff: Lab Results  Component Value Date   WBC 15.8 (H) 03/05/2017   HGB 12.9 (L) 03/05/2017   HCT 38.1 (L) 03/05/2017   PLT 237 03/05/2017   LYMPHOPCT 9 03/05/2017   MONOPCT 7 03/05/2017   EOSPCT 1 03/05/2017   BASOPCT 0 03/05/2017    CMP: Lab Results  Component Value Date   NA 135 03/06/2017   K 4.3 03/06/2017   CL 102 03/06/2017   CO2 24 03/06/2017   BUN <5 (L) 03/06/2017   CREATININE 0.60 (L) 03/06/2017   PROT 7.5 03/05/2017   ALBUMIN 3.6 03/05/2017   BILITOT 0.5  03/05/2017   ALKPHOS 85 03/05/2017   AST 13 (L) 03/05/2017   ALT 12 (L) 03/05/2017  .   Total Time in preparing paper work, data evaluation and todays exam - No charge  [facility],  M.D on 03/06/2017 at 3:53 PM  [facility]   Office  [phone]

## 2017-03-06 NOTE — ED Notes (Signed)
ED TO INPATIENT HANDOFF REPORT  Name/Age/Gender Joseph Nunez 21 y.o. male  Code Status    Code Status Orders        Start     Ordered   03/06/17 0148  Full code  Continuous     03/06/17 0148    Code Status History    Date Active Date Inactive Code Status Order ID Comments User Context   This patient has a current code status but no historical code status.      Home/SNF/Other Home  Chief Complaint Swelling to chest  Level of Care/Admitting Diagnosis ED Disposition    ED Disposition Condition Comment   Admit  Hospital Area: [facility] [[id number]]  Level of Care: Telemetry [5]  Admit to tele based on following criteria: Other see comments  Comments: sepsis  Diagnosis: Cellulitis of pectoral region [672094]  Admitting Physician: Ivor Costa [[id number]]  Attending Physician: Ivor Costa [[id number]]  Estimated length of stay: past midnight tomorrow  Certification:: I certify this patient will need inpatient services for at least 2 midnights  PT Class (Do Not Modify): Inpatient [101]  PT Acc Code (Do Not Modify): Private [1]       Medical History Past Medical History:  Diagnosis Date  . Cocaine abuse ([place])   . Heroin abuse ([place])   . IVDU (intravenous drug user)   . Tobacco abuse     Allergies Allergies  Allergen Reactions  . Penicillins     Has patient had a PCN reaction causing immediate rash, facial/tongue/throat swelling, SOB or lightheadedness with hypotension:yHas patient had a PCN reace cassing severe rash involving mucus membranes or skin necrosis: no Has patient had a PCN reaction that required hospitalization yes Has patient had a PCN reaction occurring within the last 10 years: no If all of the above answers are "NO", then may proceed with Cephalosporin use.     IV Location/Drains/Wounds Patient Lines/Drains/Airways Status   Active Line/Drains/Airways    Name:   Placement date:   Placement time:   Site:   Days:   Peripheral IV  03/05/17 Right;Anterior Other (Comment)  03/05/17    2326    Other (Comment)    1          Labs/Imaging Results for orders placed or performed during the hospital encounter of 03/05/17 (from the past 48 hour(s))  Urinalysis, Routine w reflex microscopic     Status: Abnormal   Collection Time: 03/05/17  7:00 PM  Result Value Ref Range   Color, Urine YELLOW YELLOW   APPearance CLEAR CLEAR   Specific Gravity, Urine 1.017 1.005 - 1.030   pH 6.0 5.0 - 8.0   Glucose, UA NEGATIVE NEGATIVE mg/dL   Hgb urine dipstick LARGE (A) NEGATIVE   Bilirubin Urine NEGATIVE NEGATIVE   Ketones, ur NEGATIVE NEGATIVE mg/dL   Protein, ur NEGATIVE NEGATIVE mg/dL   Nitrite NEGATIVE NEGATIVE   Leukocytes, UA NEGATIVE NEGATIVE   RBC / HPF TOO NUMEROUS TO COUNT 0 - 5 RBC/hpf   WBC, UA 0-5 0 - 5 WBC/hpf   Bacteria, UA NONE SEEN NONE SEEN   Squamous Epithelial / LPF NONE SEEN NONE SEEN   Mucus PRESENT   Urine rapid drug screen (hosp performed)     Status: Abnormal   Collection Time: 03/05/17  7:00 PM  Result Value Ref Range   Opiates NONE DETECTED NONE DETECTED   Cocaine POSITIVE (A) NONE DETECTED   Benzodiazepines NONE DETECTED NONE DETECTED   Amphetamines NONE DETECTED NONE DETECTED  Tetrahydrocannabinol NONE DETECTED NONE DETECTED   Barbiturates NONE DETECTED NONE DETECTED    Comment:        DRUG SCREEN FOR MEDICAL PURPOSES ONLY.  IF CONFIRMATION IS NEEDED FOR ANY PURPOSE, NOTIFY LAB WITHIN 5 DAYS.        LOWEST DETECTABLE LIMITS FOR URINE DRUG SCREEN Drug Class       Cutoff (ng/mL) Amphetamine      1000 Barbiturate      200 Benzodiazepine   433 Tricyclics       295 Opiates          300 Cocaine          300 THC              50   Comprehensive metabolic panel     Status: Abnormal   Collection Time: 03/05/17  8:15 PM  Result Value Ref Range   Sodium 133 (L) 135 - 145 mmol/L   Potassium 4.2 3.5 - 5.1 mmol/L   Chloride 96 (L) 101 - 111 mmol/L   CO2 24 22 - 32 mmol/L   Glucose, Bld 154 (H)  65 - 99 mg/dL   BUN 6 6 - 20 mg/dL   Creatinine, Ser 0.65 0.61 - 1.24 mg/dL   Calcium 8.6 (L) 8.9 - 10.3 mg/dL   Total Protein 7.5 6.5 - 8.1 g/dL   Albumin 3.6 3.5 - 5.0 g/dL   AST 13 (L) 15 - 41 U/L   ALT 12 (L) 17 - 63 U/L   Alkaline Phosphatase 85 38 - 126 U/L   Total Bilirubin 0.5 0.3 - 1.2 mg/dL   GFR calc non Af Amer >60 >60 mL/min   GFR calc Af Amer >60 >60 mL/min    Comment: (NOTE) The eGFR has been calculated using the CKD EPI equation. This calculation has not been validated in all clinical situations. eGFR's persistently <60 mL/min signify possible Chronic Kidney Disease.    Anion gap 13 5 - 15  Ethanol     Status: None   Collection Time: 03/05/17  8:15 PM  Result Value Ref Range   Alcohol, Ethyl (B) <10 <10 mg/dL    Comment:        LOWEST DETECTABLE LIMIT FOR SERUM ALCOHOL IS 10 mg/dL FOR MEDICAL PURPOSES ONLY   CBC with Differential     Status: Abnormal   Collection Time: 03/05/17  8:15 PM  Result Value Ref Range   WBC 15.8 (H) 4.0 - 10.5 K/uL   RBC 4.41 4.22 - 5.81 MIL/uL   Hemoglobin 12.9 (L) 13.0 - 17.0 g/dL   HCT 38.1 (L) 39.0 - 52.0 %   MCV 86.4 78.0 - 100.0 fL   MCH 29.3 26.0 - 34.0 pg   MCHC 33.9 30.0 - 36.0 g/dL   RDW 12.7 11.5 - 15.5 %   Platelets 237 150 - 400 K/uL   Neutrophils Relative % 83 %   Neutro Abs 13.1 (H) 1.7 - 7.7 K/uL   Lymphocytes Relative 9 %   Lymphs Abs 1.4 0.7 - 4.0 K/uL   Monocytes Relative 7 %   Monocytes Absolute 1.2 (H) 0.1 - 1.0 K/uL   Eosinophils Relative 1 %   Eosinophils Absolute 0.1 0.0 - 0.7 K/uL   Basophils Relative 0 %   Basophils Absolute 0.0 0.0 - 0.1 K/uL  I-Stat CG4 Lactic Acid, ED     Status: None   Collection Time: 03/05/17  8:25 PM  Result Value Ref Range   Lactic Acid, Venous 1.45  0.5 - 1.9 mmol/L  Basic metabolic panel     Status: Abnormal   Collection Time: 03/06/17  5:15 AM  Result Value Ref Range   Sodium 135 135 - 145 mmol/L   Potassium 4.3 3.5 - 5.1 mmol/L   Chloride 102 101 - 111 mmol/L    CO2 24 22 - 32 mmol/L   Glucose, Bld 125 (H) 65 - 99 mg/dL   BUN <5 (L) 6 - 20 mg/dL   Creatinine, Ser 0.60 (L) 0.61 - 1.24 mg/dL   Calcium 8.3 (L) 8.9 - 10.3 mg/dL   GFR calc non Af Amer >60 >60 mL/min   GFR calc Af Amer >60 >60 mL/min    Comment: (NOTE) The eGFR has been calculated using the CKD EPI equation. This calculation has not been validated in all clinical situations. eGFR's persistently <60 mL/min signify possible Chronic Kidney Disease.    Anion gap 9 5 - 15   Dg Chest 2 View  Result Date: 03/05/2017 CLINICAL DATA:  Chest pain EXAM: CHEST  2 VIEW COMPARISON:  None. FINDINGS: Lungs are clear. Heart size and pulmonary vascularity are normal. No adenopathy. No pneumothorax. No bone lesions. IMPRESSION: No edema or consolidation. Electronically Signed   By: Lowella Grip III M.D.   On: 03/05/2017 19:09   Dg Sternum  Result Date: 03/05/2017 CLINICAL DATA:  Pain after twisting injury.  Heroin user. EXAM: STERNUM - 1+ VIEW COMPARISON:  Chest radiograph of earlier today. FINDINGS: Single lateral view of the sternum demonstrates no focal osseous abnormality. No retrosternal soft tissue mass. No subcutaneous gas or osseous destruction. IMPRESSION: No acute osseous abnormality. Electronically Signed   By: Abigail Miyamoto M.D.   On: 03/05/2017 19:28   Ct Chest W Contrast  Result Date: 03/06/2017 CLINICAL DATA:  Patient developed pectoral pain on the right after throwing an orange. Leukocytosis. EXAM: CT CHEST WITH CONTRAST TECHNIQUE: Multidetector CT imaging of the chest was performed during intravenous contrast administration. CONTRAST:  75 cc Isovue-300 COMPARISON:  None. FINDINGS: Cardiovascular: Normal heart size without pericardial effusion. Normal ao aortic caliber. No large central pulmonary embolus though the study is not tailored for assessment of pulmonary emboli. Mediastinum/Nodes: No enlarged mediastinal, hilar, or axillary lymph nodes. Thyroid gland, trachea, and esophagus  demonstrate no significant findings. Lungs/Pleura: 4 mm left upper lobe subpleural nodule. No pneumonic consolidation, effusion or pneumothorax. Upper Abdomen: No acute abnormality. Musculoskeletal: Asymmetric enlargement with subtle intramuscular heterogeneous enhancement of the left pectoralis muscle relative to right. Findings are suspicious for pyomyositis. No intramuscular hemorrhage. There is overlying soft tissue induration and inflammation though the anterior chest wall extending to the upper abdomen. IMPRESSION: 1. Asymmetric enlargement of the left pectoralis muscle relative to right. There is subtle intramuscular enhancement suspicious for a developing pyomyositis of the left pectoralis. Overlying subcutaneous fatty induration is seen of the anterior chest wall that may reflect a cellulitis. These results were called by telephone at the time of interpretation on 03/06/2017 at 1:05 am to [place], who verbally acknowledged these results. 2. 4 mm left upper lobe subpleural nodule. No follow-up needed if patient is low-risk. Non-contrast chest CT can be considered in 12 months if patient is high-risk. This recommendation follows the consensus statement: Guidelines for Management of Incidental Pulmonary Nodules Detected on CT Images: From the Fleischner Society 2017; Radiology 2017; 284:228-243. Electronically Signed   By: Ashley Royalty M.D.   On: 03/06/2017 01:06    Pending Labs [employer]  Ordered   03/06/17 0524  CBC  Once,   R     03/06/17 0524   03/06/17 0147  Lactic acid, plasma  Once,   STAT     03/06/17 0146   03/06/17 0147  Procalcitonin  STAT,   R     03/06/17 0146   03/06/17 0147  Protime-INR  STAT,   R     03/06/17 0146   03/06/17 0147  HIV antibody (Routine Testing)  Once,   R     03/06/17 0148   03/06/17 0146  C-reactive protein  Once,   R     03/06/17 0145   03/06/17 0146  Sedimentation rate  Once,   R     03/06/17 0145   03/05/17 2337  Culture, blood  (routine x 2)  BLOOD CULTURE X 2,   STAT     03/05/17 2337      Vitals/Pain Today's Vitals   03/06/17 1034 03/06/17 1130 03/06/17 1212 03/06/17 1246  BP:   113/86   Pulse:   (!) 110   Resp:  (!) 28 (!) 22   Temp: 99.4 F (37.4 C)     TempSrc: Oral     SpO2:   100%   Weight:      Height:      PainSc:    8     Isolation Precautions No active isolations  Medications Medications  ketorolac (TORADOL) 30 MG/ML injection 30 mg (30 mg Intravenous Given 03/06/17 0916)  oxyCODONE-acetaminophen (PERCOCET/ROXICET) 5-325 MG per tablet 1 tablet (1 tablet Oral Given 03/06/17 1245)  ondansetron (ZOFRAN) injection 4 mg (not administered)  LORazepam (ATIVAN) injection 0.5 mg (0.5 mg Intravenous Given 03/06/17 1027)  0.9 %  sodium chloride infusion ( Intravenous New Bag/Given 03/06/17 1027)  enoxaparin (LOVENOX) injection 40 mg (not administered)  acetaminophen (TYLENOL) tablet 650 mg (not administered)    Or  acetaminophen (TYLENOL) suppository 650 mg (not administered)  zolpidem (AMBIEN) tablet 5 mg (not administered)  nicotine (NICODERM CQ - dosed in mg/24 hours) patch 21 mg (21 mg Transdermal Refused 03/06/17 1242)  vancomycin (VANCOCIN) IVPB 1000 mg/200 mL premix (0 mg Intravenous Stopped 03/06/17 1034)  aztreonam (AZACTAM) 2 g in dextrose 5 % 50 mL IVPB (not administered)  morphine 4 MG/ML injection (  Not Given 03/06/17 1010)  hydrOXYzine (ATARAX/VISTARIL) tablet 25 mg (25 mg Oral Given 03/06/17 1245)  ketorolac (TORADOL) 30 MG/ML injection 30 mg (30 mg Intramuscular Given 03/05/17 1921)  HYDROmorphone (DILAUDID) injection 1 mg (1 mg Intravenous Given 03/05/17 2328)  sodium chloride 0.9 % bolus 1,000 mL (0 mLs Intravenous Stopped 03/06/17 0006)  sodium chloride 0.9 % bolus 1,000 mL (0 mLs Intravenous Stopped 03/06/17 0100)  acetaminophen (TYLENOL) tablet 650 mg (650 mg Oral Given 03/05/17 2343)  iopamidol (ISOVUE-300) 61 % injection 75 mL (75 mLs Intravenous Contrast Given 03/06/17  0034)  vancomycin (VANCOCIN) IVPB 1000 mg/200 mL premix (0 mg Intravenous Stopped 03/06/17 0312)  aztreonam (AZACTAM) 2 g in dextrose 5 % 50 mL IVPB (0 g Intravenous Stopped 03/06/17 0345)  morphine 4 MG/ML injection 4 mg (4 mg Intravenous Given 03/06/17 0745)    Mobility walks

## 2017-03-06 NOTE — ED Notes (Signed)
Patient transported to CT 

## 2017-03-06 NOTE — ED Notes (Signed)
Aztreonam ordered from pharmacy.

## 2017-03-06 NOTE — H&P (Signed)
History and Physical    Joseph Nunez UQJ:[id number] DOB: 1995/11/28 DOA: 03/05/2017  Referring MD/NP/PA:   PCP: Patient, No Pcp Per   Patient coming from:  The patient is coming from home.  At baseline, pt is independent for most of ADL.  Chief Complaint: left chest wall pain and fever  HPI: Joseph Nunez is a 21 y.o. male with medical history significant of polysubstance abuse (heroin, tobacco, cocaine) and IV drug user, who presents with left chest wall pain and fever.  Pt states that he started having left chest wall pain 4 days ago. The area is also swelling and erythema. The pain is constant, 10 out of 10 in severity, nonradiating. It is associated with subjective fever and chills. Patient denies shortness breath, no GI symptoms. Denies symptoms of UTI or unilateral weakness. Patient is an active heroine IV drug user with last use an hour prior to arrival to ED. He denies any trauma or injury to the area.  ED Course: pt was found to have WBC 15.8, lactic acid 1.45, positive urinalysis for cocaine, negative urinalysis, alcohol level less than 10, creatinine normal, temperature 99.9, tachycardia, tachypnea, oxygen saturation 98% on room air. Chest x-ray negative. Patient is admitted to telemetry bed as inpatient.  # CT of the chest showed:  1. Asymmetric enlargement of the left pectoralis muscle relative to right. There is subtle intramuscular enhancement suspicious for a developing pyomyositis of the left pectoralis. Overlying subcutaneous fatty induration is seen of the anterior chest wall that may reflect a cellulitis.   2. 4 mm left upper lobe subpleural nodule.   Review of Systems:   General: has fevers, chills, no body weight gain, has fatigue HEENT: no blurry vision, hearing changes or sore throat Respiratory: no dyspnea, coughing, wheezing CV: no chest pain, no palpitations GI: no nausea, vomiting, abdominal pain, diarrhea, constipation GU: no dysuria, burning on  urination, increased urinary frequency, hematuria  Ext: no leg edema Neuro: no unilateral weakness, numbness, or tingling, no vision change or hearing loss Skin: has swelling, erythema, warmth and tenderness in left upper chest wall. MSK: No muscle spasm, no deformity, no limitation of range of movement in spin Heme: No easy bruising.  Travel history: No recent long distant travel.  Allergy:  Allergies  Allergen Reactions  . Penicillins     Has patient had a PCN reaction causing immediate rash, facial/tongue/throat swelling, SOB or lightheadedness with hypotension:yHas patient had a PCN reace cassing severe rash involving mucus membranes or skin necrosis: no Has patient had a PCN reaction that required hospitalization yes Has patient had a PCN reaction occurring within the last 10 years: no If all of the above answers are "NO", then may proceed with Cephalosporin use.     Past Medical History:  Diagnosis Date  . Cocaine abuse ([place])   . Heroin abuse ([place])   . IVDU (intravenous drug user)   . Tobacco abuse     No past surgical history on file.  Social History:  reports that he has been smoking.  He does not have any smokeless tobacco history on file. He reports that he uses drugs, including "Crack" cocaine and Heroin. He reports that he does not drink alcohol.  Family History:  Family History  Problem Relation Age of Onset  . Heart disease Mother   . Diabetes Mellitus II Sister      Prior to Admission medications   Medication Sig Start Date End Date Taking? Authorizing Provider  naproxen sodium (ANAPROX) 220 MG  tablet Take 880 mg by mouth 2 (two) times daily with a meal.   Yes [provider]    Physical Exam: Vitals:   03/05/17 1612 03/05/17 1750 03/05/17 2319 03/06/17 0148  BP: 115/68 122/75 135/74 116/73  Pulse: (!) 109 93 (!) 103 95  Resp: 18  20 18   Temp: 99.9 F (37.7 C)     TempSrc: Oral     SpO2: 98% 100% 99% 99%  Height: 5' 4"  (1.626 m)       General: Not in acute distress HEENT:       Eyes: PERRL, EOMI, no scleral icterus.       ENT: No discharge from the ears and nose, no pharynx injection, no tonsillar enlargement.        Neck: No JVD, no bruit, no mass felt. Heme: No neck lymph node enlargement. Cardiac: S1/S2, RRR, No murmurs, No gallops or rubs. Respiratory: No rales, wheezing, rhonchi or rubs. GI: Soft, nondistended, nontender, no rebound pain, no organomegaly, BS present. GU: No hematuria Ext: No pitting leg edema bilaterally. 2+DP/PT pulse bilaterally. Musculoskeletal: No joint deformities, No joint redness or warmth, no limitation of ROM in spin. Skin: has swelling, erythema, warmth and tenderness in left upper chest wall Neuro: Alert, oriented X3, cranial nerves II-XII grossly intact, moves all extremities normally. Psych: Patient is not psychotic, no suicidal or hemocidal ideation.  Labs on Admission: I have personally reviewed following labs and imaging studies  CBC:  Recent Labs Lab 03/05/17 2015  WBC 15.8*  NEUTROABS 13.1*  HGB 12.9*  HCT 38.1*  MCV 86.4  PLT 941   Basic Metabolic Panel:  Recent Labs Lab 03/05/17 2015  NA 133*  K 4.2  CL 96*  CO2 24  GLUCOSE 154*  BUN 6  CREATININE 0.65  CALCIUM 8.6*   GFR: CrCl cannot be calculated (Unknown ideal weight.). Liver Function Tests:  Recent Labs Lab 03/05/17 2015  AST 13*  ALT 12*  ALKPHOS 85  BILITOT 0.5  PROT 7.5  ALBUMIN 3.6   No results for input(s): LIPASE, AMYLASE in the last 168 hours. No results for input(s): AMMONIA in the last 168 hours. Coagulation Profile: No results for input(s): INR, PROTIME in the last 168 hours. Cardiac Enzymes: No results for input(s): CKTOTAL, CKMB, CKMBINDEX, TROPONINI in the last 168 hours. BNP (last 3 results) No results for input(s): PROBNP in the last 8760 hours. HbA1C: No results for input(s): HGBA1C in the last 72 hours. CBG: No results for input(s): GLUCAP in the last 168  hours. Lipid Profile: No results for input(s): CHOL, HDL, LDLCALC, TRIG, CHOLHDL, LDLDIRECT in the last 72 hours. Thyroid Function Tests: No results for input(s): TSH, T4TOTAL, FREET4, T3FREE, THYROIDAB in the last 72 hours. Anemia Panel: No results for input(s): VITAMINB12, FOLATE, FERRITIN, TIBC, IRON, RETICCTPCT in the last 72 hours. Urine analysis:    Component Value Date/Time   COLORURINE YELLOW 03/05/2017 1900   APPEARANCEUR CLEAR 03/05/2017 1900   LABSPEC 1.017 03/05/2017 1900   PHURINE 6.0 03/05/2017 1900   GLUCOSEU NEGATIVE 03/05/2017 1900   HGBUR LARGE (A) 03/05/2017 1900   BILIRUBINUR NEGATIVE 03/05/2017 1900   KETONESUR NEGATIVE 03/05/2017 1900   PROTEINUR NEGATIVE 03/05/2017 1900   NITRITE NEGATIVE 03/05/2017 1900   LEUKOCYTESUR NEGATIVE 03/05/2017 1900   Sepsis Labs: @LABRCNTIP (procalcitonin:4,lacticidven:4) )No results found for this or any previous visit (from the past 240 hour(s)).   Radiological Exams on Admission: Dg Chest 2 View  Result Date: 03/05/2017 CLINICAL DATA:  Chest pain EXAM:  CHEST  2 VIEW COMPARISON:  None. FINDINGS: Lungs are clear. Heart size and pulmonary vascularity are normal. No adenopathy. No pneumothorax. No bone lesions. IMPRESSION: No edema or consolidation. Electronically Signed   By: Lowella Grip III M.D.   On: 03/05/2017 19:09   Dg Sternum  Result Date: 03/05/2017 CLINICAL DATA:  Pain after twisting injury.  Heroin user. EXAM: STERNUM - 1+ VIEW COMPARISON:  Chest radiograph of earlier today. FINDINGS: Single lateral view of the sternum demonstrates no focal osseous abnormality. No retrosternal soft tissue mass. No subcutaneous gas or osseous destruction. IMPRESSION: No acute osseous abnormality. Electronically Signed   By: Abigail Miyamoto M.D.   On: 03/05/2017 19:28   Ct Chest W Contrast  Result Date: 03/06/2017 CLINICAL DATA:  Patient developed pectoral pain on the right after throwing an orange. Leukocytosis. EXAM: CT CHEST WITH  CONTRAST TECHNIQUE: Multidetector CT imaging of the chest was performed during intravenous contrast administration. CONTRAST:  75 cc Isovue-300 COMPARISON:  None. FINDINGS: Cardiovascular: Normal heart size without pericardial effusion. Normal ao aortic caliber. No large central pulmonary embolus though the study is not tailored for assessment of pulmonary emboli. Mediastinum/Nodes: No enlarged mediastinal, hilar, or axillary lymph nodes. Thyroid gland, trachea, and esophagus demonstrate no significant findings. Lungs/Pleura: 4 mm left upper lobe subpleural nodule. No pneumonic consolidation, effusion or pneumothorax. Upper Abdomen: No acute abnormality. Musculoskeletal: Asymmetric enlargement with subtle intramuscular heterogeneous enhancement of the left pectoralis muscle relative to right. Findings are suspicious for pyomyositis. No intramuscular hemorrhage. There is overlying soft tissue induration and inflammation though the anterior chest wall extending to the upper abdomen. IMPRESSION: 1. Asymmetric enlargement of the left pectoralis muscle relative to right. There is subtle intramuscular enhancement suspicious for a developing pyomyositis of the left pectoralis. Overlying subcutaneous fatty induration is seen of the anterior chest wall that may reflect a cellulitis. These results were called by telephone at the time of interpretation on 03/06/2017 at 1:05 am to [place], who verbally acknowledged these results. 2. 4 mm left upper lobe subpleural nodule. No follow-up needed if patient is low-risk. Non-contrast chest CT can be considered in 12 months if patient is high-risk. This recommendation follows the consensus statement: Guidelines for Management of Incidental Pulmonary Nodules Detected on CT Images: From the Fleischner Society 2017; Radiology 2017; 284:228-243. Electronically Signed   By: Ashley Royalty M.D.   On: 03/06/2017 01:06     EKG: Not done in ED.  Assessment/Plan Principal  Problem:   Cellulitis of pectoral region Active Problems:   Heroin abuse ([facility])   IVDU (intravenous drug user)   Sepsis ([place])   Pyomyositis   Cocaine abuse ([place])   Tobacco abuse   Sepsis due to cellulitis and pyomyositis  of pectoral region: patient admits criteria for sepsis with fever, leukocytosis, tachycardia and tachypnea.lactic acid is normal. Hemodynamically stable currently. Patient is IV drug user, will needs antibiotics with broad coverage.  - will admit to tele bed as inpt - Empiric antimicrobial treatment with vancomycin and aztreonam per pharmacy - PRN Zofran for nausea,  - prn Percocet and IV Toradol for pain (will avoid IV narcotics) - Blood cultures x 2  - ESR and CRP - will get Procalcitonin and trend lactic acid levels per sepsis protocol. - IVF: 2 L of NS bolus in ED, followed by 150 cc/h -check HIV  ab  Polysubstance abuse: including, Heroin, tobacco and cocaine abuse, IVDU.  -did counseling about importance of quitting substance use. -Nicotine patch -When necessary Ativan for  anxiety  4 mm left upper lobe subpleural nodule:  Patient has this incidental finding on CT of the chest -follow-up with PCP   DVT ppx: SQ Lovenox Code Status: Full code Family Communication: None at bed side.   Disposition Plan:  Anticipate discharge back to previous home environment Consults called:  none Admission status:  Inpatient/tele     Date of Service 03/06/2017    Ivor Costa [facility] Pager [phone]  If 7PM-7AM, please contact night-coverage [email or web address] Password [email or web address] 03/06/2017, 2:10 AM

## 2017-03-06 NOTE — Care Management Note (Signed)
Case Management Note  Patient Details  Name: Joseph Nunez MRN: [id number] Date of Birth: 1995/07/29  Subjective/Objective:  21 y/o m admitted w/pulled muscle. From home.                  Action/Plan:d/c home.   Expected Discharge Date:   (unknown)               Expected Discharge Plan:  Home/Self Care  In-House Referral:     Discharge planning Services  CM Consult  Post Acute Care Choice:    Choice offered to:     DME Arranged:    DME Agency:     HH Arranged:    [place] Agency:     Status of Service:  Completed, signed off  If discussed at [employer] of Stay Meetings, dates discussed:    Additional Comments:  Dessa Phi, RN 03/06/2017, 3:53 PM

## 2017-03-07 ENCOUNTER — Inpatient Hospital Stay (HOSPITAL_COMMUNITY)
Admission: AD | Admit: 2017-03-07 | Discharge: 2017-03-13 | DRG: 854 | Disposition: A | Discharge status: Home / Self Care | Payer: Medicaid Other | Source: Ambulatory Visit | Attending: Internal Medicine | Admitting: Internal Medicine

## 2017-03-07 ENCOUNTER — Encounter (HOSPITAL_COMMUNITY): Payer: Self-pay

## 2017-03-07 DIAGNOSIS — L03313 Cellulitis of chest wall: Secondary | ICD-10-CM | POA: Diagnosis present

## 2017-03-07 DIAGNOSIS — R195 Other fecal abnormalities: Secondary | ICD-10-CM | POA: Diagnosis not present

## 2017-03-07 DIAGNOSIS — F1990 Other psychoactive substance use, unspecified, uncomplicated: Secondary | ICD-10-CM | POA: Diagnosis not present

## 2017-03-07 DIAGNOSIS — F1410 Cocaine abuse, uncomplicated: Secondary | ICD-10-CM | POA: Diagnosis present

## 2017-03-07 DIAGNOSIS — L02213 Cutaneous abscess of chest wall: Secondary | ICD-10-CM | POA: Diagnosis present

## 2017-03-07 DIAGNOSIS — I8290 Acute embolism and thrombosis of unspecified vein: Secondary | ICD-10-CM | POA: Diagnosis not present

## 2017-03-07 DIAGNOSIS — A4102 Sepsis due to Methicillin resistant Staphylococcus aureus: Secondary | ICD-10-CM | POA: Diagnosis present

## 2017-03-07 DIAGNOSIS — Z720 Tobacco use: Secondary | ICD-10-CM | POA: Diagnosis present

## 2017-03-07 DIAGNOSIS — M60009 Infective myositis, unspecified site: Secondary | ICD-10-CM | POA: Diagnosis present

## 2017-03-07 DIAGNOSIS — F1910 Other psychoactive substance abuse, uncomplicated: Secondary | ICD-10-CM

## 2017-03-07 DIAGNOSIS — F17200 Nicotine dependence, unspecified, uncomplicated: Secondary | ICD-10-CM | POA: Diagnosis present

## 2017-03-07 DIAGNOSIS — R7881 Bacteremia: Secondary | ICD-10-CM | POA: Diagnosis present

## 2017-03-07 DIAGNOSIS — F1110 Opioid abuse, uncomplicated: Secondary | ICD-10-CM | POA: Diagnosis present

## 2017-03-07 DIAGNOSIS — F17210 Nicotine dependence, cigarettes, uncomplicated: Secondary | ICD-10-CM | POA: Diagnosis not present

## 2017-03-07 LAB — BLOOD CULTURE ID PANEL (REFLEXED)
Acinetobacter baumannii: NOT DETECTED
CANDIDA KRUSEI: NOT DETECTED
CANDIDA PARAPSILOSIS: NOT DETECTED
Candida albicans: NOT DETECTED
Candida glabrata: NOT DETECTED
Candida tropicalis: NOT DETECTED
ENTEROCOCCUS SPECIES: NOT DETECTED
ESCHERICHIA COLI: NOT DETECTED
Enterobacter cloacae complex: NOT DETECTED
Enterobacteriaceae species: NOT DETECTED
Haemophilus influenzae: NOT DETECTED
Klebsiella oxytoca: NOT DETECTED
Klebsiella pneumoniae: NOT DETECTED
LISTERIA MONOCYTOGENES: NOT DETECTED
Methicillin resistance: DETECTED — AB
Neisseria meningitidis: NOT DETECTED
PROTEUS SPECIES: NOT DETECTED
Pseudomonas aeruginosa: NOT DETECTED
SERRATIA MARCESCENS: NOT DETECTED
STAPHYLOCOCCUS AUREUS BCID: DETECTED — AB
STAPHYLOCOCCUS SPECIES: DETECTED — AB
STREPTOCOCCUS AGALACTIAE: NOT DETECTED
Streptococcus pneumoniae: NOT DETECTED
Streptococcus pyogenes: NOT DETECTED
Streptococcus species: NOT DETECTED

## 2017-03-07 LAB — COMPREHENSIVE METABOLIC PANEL
ALK PHOS: 94 U/L (ref 38–126)
ALT: 13 U/L — AB (ref 17–63)
AST: 16 U/L (ref 15–41)
Albumin: 3.4 g/dL — ABNORMAL LOW (ref 3.5–5.0)
Anion gap: 11 (ref 5–15)
BUN: 6 mg/dL (ref 6–20)
CALCIUM: 9 mg/dL (ref 8.9–10.3)
CO2: 28 mmol/L (ref 22–32)
CREATININE: 0.6 mg/dL — AB (ref 0.61–1.24)
Chloride: 99 mmol/L — ABNORMAL LOW (ref 101–111)
Glucose, Bld: 121 mg/dL — ABNORMAL HIGH (ref 65–99)
Potassium: 3.8 mmol/L (ref 3.5–5.1)
Sodium: 138 mmol/L (ref 135–145)
Total Bilirubin: 0.6 mg/dL (ref 0.3–1.2)
Total Protein: 7.8 g/dL (ref 6.5–8.1)

## 2017-03-07 LAB — CBC
HCT: 36.1 % — ABNORMAL LOW (ref 39.0–52.0)
HEMOGLOBIN: 12.4 g/dL — AB (ref 13.0–17.0)
MCH: 29.7 pg (ref 26.0–34.0)
MCHC: 34.3 g/dL (ref 30.0–36.0)
MCV: 86.4 fL (ref 78.0–100.0)
Platelets: 327 10*3/uL (ref 150–400)
RBC: 4.18 MIL/uL — ABNORMAL LOW (ref 4.22–5.81)
RDW: 12.9 % (ref 11.5–15.5)
WBC: 20.1 10*3/uL — ABNORMAL HIGH (ref 4.0–10.5)

## 2017-03-07 MED ORDER — NICOTINE 21 MG/24HR TD PT24
21.0000 mg | MEDICATED_PATCH | Freq: Every day | TRANSDERMAL | Status: DC
  Administered 2017-03-07 – 2017-03-12 (×6): 21 mg via TRANSDERMAL
  Filled 2017-03-07 (×8): qty 1

## 2017-03-07 MED ORDER — VANCOMYCIN HCL IN DEXTROSE 1-5 GM/200ML-% IV SOLN: 1000.0000 mg | Freq: Three times a day (TID) | INTRAVENOUS | Status: DC

## 2017-03-07 MED ORDER — SODIUM CHLORIDE 0.9 % IV SOLN
INTRAVENOUS | Status: DC
  Administered 2017-03-07 – 2017-03-12 (×5): via INTRAVENOUS

## 2017-03-07 MED ORDER — KETOROLAC TROMETHAMINE 15 MG/ML IJ SOLN
15.0000 mg | Freq: Four times a day (QID) | INTRAMUSCULAR | Status: DC | PRN
Start: 2017-03-07 — End: 2017-03-09
  Administered 2017-03-07 – 2017-03-09 (×8): 15 mg via INTRAVENOUS
  Filled 2017-03-07 (×8): qty 1

## 2017-03-07 MED ORDER — SODIUM CHLORIDE 0.9% FLUSH
3.0000 mL | Freq: Two times a day (BID) | INTRAVENOUS | Status: DC
  Administered 2017-03-07 – 2017-03-12 (×6): 3 mL via INTRAVENOUS

## 2017-03-07 MED ORDER — OXYCODONE HCL 5 MG PO TABS
5.0000 mg | ORAL_TABLET | ORAL | Status: DC | PRN
  Administered 2017-03-07 – 2017-03-08 (×4): 5 mg via ORAL
  Filled 2017-03-07 (×4): qty 1

## 2017-03-07 MED ORDER — LORAZEPAM 1 MG PO TABS
1.0000 mg | ORAL_TABLET | Freq: Four times a day (QID) | ORAL | Status: DC | PRN
  Administered 2017-03-07 – 2017-03-10 (×6): 1 mg via ORAL
  Filled 2017-03-07 (×6): qty 1

## 2017-03-07 MED ORDER — ACETAMINOPHEN 325 MG PO TABS
650.0000 mg | ORAL_TABLET | Freq: Four times a day (QID) | ORAL | Status: DC | PRN
  Administered 2017-03-07 (×2): 650 mg via ORAL
  Filled 2017-03-07 (×2): qty 2

## 2017-03-07 MED ORDER — VANCOMYCIN HCL IN DEXTROSE 1-5 GM/200ML-% IV SOLN
1000.0000 mg | Freq: Three times a day (TID) | INTRAVENOUS | Status: DC
  Administered 2017-03-07 – 2017-03-10 (×8): 1000 mg via INTRAVENOUS
  Filled 2017-03-07 (×6): qty 200

## 2017-03-07 MED ORDER — ACETAMINOPHEN 650 MG RE SUPP: 650.0000 mg | Freq: Four times a day (QID) | RECTAL | Status: DC | PRN

## 2017-03-07 MED ORDER — ENOXAPARIN SODIUM 40 MG/0.4ML ~~LOC~~ SOLN
40.0000 mg | SUBCUTANEOUS | Status: DC
  Administered 2017-03-07 – 2017-03-12 (×5): 40 mg via SUBCUTANEOUS
  Filled 2017-03-07 (×6): qty 0.4

## 2017-03-07 MED ORDER — OXYCODONE HCL 5 MG PO TABS
5.0000 mg | ORAL_TABLET | Freq: Once | ORAL | Status: AC
  Administered 2017-03-07: 5 mg via ORAL
  Filled 2017-03-07: qty 1

## 2017-03-07 MED ORDER — VANCOMYCIN HCL 10 G IV SOLR
1500.0000 mg | Freq: Once | INTRAVENOUS | Status: AC
  Administered 2017-03-07: 1500 mg via INTRAVENOUS
  Filled 2017-03-07: qty 1500

## 2017-03-07 MED ORDER — HYDROXYZINE HCL 25 MG PO TABS
50.0000 mg | ORAL_TABLET | Freq: Four times a day (QID) | ORAL | Status: DC | PRN
  Administered 2017-03-07: 50 mg via ORAL
  Filled 2017-03-07: qty 2

## 2017-03-07 NOTE — Consult Note (Signed)
[facility]    Date of Admission:  03/07/2017   Total days of antibiotics: 0 vanco             Reason for Consult: MRSA bacteremia   Referring Provider: CHAMP   Assessment: MRSA bacteremia Chest wall abscess IVDA  Plan: 1. Continue vanco 2. Stop zosyn 3. Repeat BCx in AM 4. Check TEE 5. Surgical eval 6. Check HIV and Hep C, Hep B and gc/chalmydia, RPR 7. Detox 8. MRSA/contact islation.   Thank you so much for this interesting consult,  Active Problems:   Bacteremia   . enoxaparin (LOVENOX) injection  40 mg Subcutaneous Q24H  . sodium chloride flush  3 mL Intravenous Q12H    HPI: Joseph Nunez is a 21 y.o. male with hx of IVDA, cocaine, heroin, came to ED on 10-17 with chest pain and found on CT to have pyomyositis. He left the ED AMA. He received a dose of vanco and azactam prior to this.  He was called back when his BCx were positive for MRSA.    Review of Systems: Review of Systems  Constitutional: Negative for chills and fever.  Eyes: Negative for blurred vision.  Respiratory: Negative for cough and shortness of breath.   Cardiovascular: Positive for chest pain.  Gastrointestinal: Positive for constipation. Negative for diarrhea.  Genitourinary: Negative for dysuria.  Neurological: Negative for headaches.  Please see HPI. All other systems reviewed and negative.   Past Medical History:  Diagnosis Date  . Cocaine abuse ([place])   . Heroin abuse ([place])   . IVDU (intravenous drug user)   . Tobacco abuse     Social History  Substance Use Topics  . Smoking status: Current Every Day Smoker  . Smokeless tobacco: Never Used  . Alcohol use No    Family History  Problem Relation Age of Onset  . Heart disease Mother   . Diabetes Mellitus II Sister      Medications:  Scheduled: . enoxaparin (LOVENOX) injection  40 mg Subcutaneous Q24H  . sodium chloride flush  3 mL Intravenous Q12H    Abtx:    Anti-infectives    Start     Dose/Rate Route Frequency Ordered Stop   03/07/17 2000  vancomycin (VANCOCIN) IVPB 1000 mg/200 mL premix     1,000 mg 200 mL/hr over 60 Minutes Intravenous Every 8 hours 03/07/17 1346     03/07/17 1800  vancomycin (VANCOCIN) IVPB 1000 mg/200 mL premix  Status:  Discontinued     1,000 mg 200 mL/hr over 60 Minutes Intravenous Every 8 hours 03/07/17 1047 03/07/17 1346   03/07/17 1100  vancomycin (VANCOCIN) 1,500 mg in sodium chloride 0.9 % 500 mL IVPB     1,500 mg 250 mL/hr over 120 Minutes Intravenous  Once 03/07/17 1044 03/07/17 1537        OBJECTIVE: Blood pressure 134/82, pulse (!) 108, temperature (!) 100.8 F (38.2 C), resp. rate 20, height 5' 5"  (1.651 m), weight 61.2 kg (135 lb), SpO2 100 %.  Physical Exam  Constitutional: He appears distressed.  HENT:  Mouth/Throat: No oropharyngeal exudate.  Eyes: Pupils are equal, round, and reactive to light. EOM are normal.  Neck: Normal range of motion. Neck supple.  Cardiovascular: Normal rate and regular rhythm.   Murmur heard. Pulmonary/Chest: Effort normal and breath sounds normal.    Abdominal: Soft. Bowel sounds are normal. There is no tenderness. There is no rebound.  Musculoskeletal: He exhibits  no edema.  Lymphadenopathy:    He has no cervical adenopathy.  Skin:  numerous track marks  Psychiatric: Mood normal.    Lab Results Results for orders placed or performed during the hospital encounter of 03/07/17 (from the past 48 hour(s))  CBC     Status: Abnormal   Collection Time: 03/07/17 12:15 PM  Result Value Ref Range   WBC 20.1 (H) 4.0 - 10.5 K/uL   RBC 4.18 (L) 4.22 - 5.81 MIL/uL   Hemoglobin 12.4 (L) 13.0 - 17.0 g/dL   HCT 36.1 (L) 39.0 - 52.0 %   MCV 86.4 78.0 - 100.0 fL   MCH 29.7 26.0 - 34.0 pg   MCHC 34.3 30.0 - 36.0 g/dL   RDW 12.9 11.5 - 15.5 %   Platelets 327 150 - 400 K/uL  Comprehensive metabolic panel     Status: Abnormal   Collection Time: 03/07/17 12:15 PM  Result  Value Ref Range   Sodium 138 135 - 145 mmol/L   Potassium 3.8 3.5 - 5.1 mmol/L   Chloride 99 (L) 101 - 111 mmol/L   CO2 28 22 - 32 mmol/L   Glucose, Bld 121 (H) 65 - 99 mg/dL   BUN 6 6 - 20 mg/dL   Creatinine, Ser 0.60 (L) 0.61 - 1.24 mg/dL   Calcium 9.0 8.9 - 10.3 mg/dL   Total Protein 7.8 6.5 - 8.1 g/dL   Albumin 3.4 (L) 3.5 - 5.0 g/dL   AST 16 15 - 41 U/L   ALT 13 (L) 17 - 63 U/L   Alkaline Phosphatase 94 38 - 126 U/L   Total Bilirubin 0.6 0.3 - 1.2 mg/dL   GFR calc non Af Amer >60 >60 mL/min   GFR calc Af Amer >60 >60 mL/min    Comment: (NOTE) The eGFR has been calculated using the CKD EPI equation. This calculation has not been validated in all clinical situations. eGFR's persistently <60 mL/min signify possible Chronic Kidney Disease.    Anion gap 11 5 - 15      Component Value Date/Time   SDES BLOOD LEFT ARM 03/06/2017 1451   SPECREQUEST IN PEDIATRIC BOTTLE Blood Culture adequate volume 03/06/2017 1451   CULT  03/06/2017 1451    NO GROWTH < 24 HOURS Performed at [facility] [address] [place], [place] [address]    REPTSTATUS PENDING 03/06/2017 1451   Dg Chest 2 View  Result Date: 03/05/2017 CLINICAL DATA:  Chest pain EXAM: CHEST  2 VIEW COMPARISON:  None. FINDINGS: Lungs are clear. Heart size and pulmonary vascularity are normal. No adenopathy. No pneumothorax. No bone lesions. IMPRESSION: No edema or consolidation. Electronically Signed   By: Lowella Grip III M.D.   On: 03/05/2017 19:09   Dg Sternum  Result Date: 03/05/2017 CLINICAL DATA:  Pain after twisting injury.  Heroin user. EXAM: STERNUM - 1+ VIEW COMPARISON:  Chest radiograph of earlier today. FINDINGS: Single lateral view of the sternum demonstrates no focal osseous abnormality. No retrosternal soft tissue mass. No subcutaneous gas or osseous destruction. IMPRESSION: No acute osseous abnormality. Electronically Signed   By: Abigail Miyamoto M.D.   On: 03/05/2017 19:28   Ct Chest W  Contrast  Result Date: 03/06/2017 CLINICAL DATA:  Patient developed pectoral pain on the right after throwing an orange. Leukocytosis. EXAM: CT CHEST WITH CONTRAST TECHNIQUE: Multidetector CT imaging of the chest was performed during intravenous contrast administration. CONTRAST:  75 cc Isovue-300 COMPARISON:  None. FINDINGS: Cardiovascular: Normal heart size without  pericardial effusion. Normal ao aortic caliber. No large central pulmonary embolus though the study is not tailored for assessment of pulmonary emboli. Mediastinum/Nodes: No enlarged mediastinal, hilar, or axillary lymph nodes. Thyroid gland, trachea, and esophagus demonstrate no significant findings. Lungs/Pleura: 4 mm left upper lobe subpleural nodule. No pneumonic consolidation, effusion or pneumothorax. Upper Abdomen: No acute abnormality. Musculoskeletal: Asymmetric enlargement with subtle intramuscular heterogeneous enhancement of the left pectoralis muscle relative to right. Findings are suspicious for pyomyositis. No intramuscular hemorrhage. There is overlying soft tissue induration and inflammation though the anterior chest wall extending to the upper abdomen. IMPRESSION: 1. Asymmetric enlargement of the left pectoralis muscle relative to right. There is subtle intramuscular enhancement suspicious for a developing pyomyositis of the left pectoralis. Overlying subcutaneous fatty induration is seen of the anterior chest wall that may reflect a cellulitis. These results were called by telephone at the time of interpretation on 03/06/2017 at 1:05 am to [place], who verbally acknowledged these results. 2. 4 mm left upper lobe subpleural nodule. No follow-up needed if patient is low-risk. Non-contrast chest CT can be considered in 12 months if patient is high-risk. This recommendation follows the consensus statement: Guidelines for Management of Incidental Pulmonary Nodules Detected on CT Images: From the Fleischner Society 2017;  Radiology 2017; 284:228-243. Electronically Signed   By: Ashley Royalty M.D.   On: 03/06/2017 01:06   Recent Results (from the past 240 hour(s))  Culture, blood (routine x 2)     Status: Abnormal (Preliminary result)   Collection Time: 03/06/17  1:55 AM  Result Value Ref Range Status   Specimen Description BLOOD LEFT ANTECUBITAL  Final   Special Requests   Final    BOTTLES DRAWN AEROBIC AND ANAEROBIC Blood Culture adequate volume   Culture  Setup Time   Final    GRAM POSITIVE COCCI IN CLUSTERS IN BOTH AEROBIC AND ANAEROBIC BOTTLES CRITICAL RESULT CALLED TO, READ BACK BY AND VERIFIED WITH: DR. [facility], AT 0715 03/07/17 BY D. VANHOOK    Culture (A)  Final    STAPHYLOCOCCUS AUREUS SUSCEPTIBILITIES TO FOLLOW Performed at [facility], [place] [address] [place], [place] [address]    Report Status PENDING  Incomplete  Blood Culture ID Panel (Reflexed)     Status: Abnormal   Collection Time: 03/06/17  1:55 AM  Result Value Ref Range Status   Enterococcus species NOT DETECTED NOT DETECTED Final   Listeria monocytogenes NOT DETECTED NOT DETECTED Final   Staphylococcus species DETECTED (A) NOT DETECTED Final    Comment: CRITICAL RESULT CALLED TO, READ BACK BY AND VERIFIED WITH: DR. [facility], AT 0715 03/07/17 BY D. VANHOOK    Staphylococcus aureus DETECTED (A) NOT DETECTED Final    Comment: Methicillin (oxacillin)-resistant Staphylococcus aureus (MRSA). MRSA is predictably resistant to beta-lactam antibiotics (except ceftaroline). Preferred therapy is vancomycin unless clinically contraindicated. Patient requires contact precautions if  hospitalized. CRITICAL RESULT CALLED TO, READ BACK BY AND VERIFIED WITH: DR. [facility], AT 0715 03/07/17 BY D. VANHOOK    Methicillin resistance DETECTED (A) NOT DETECTED Final    Comment: CRITICAL RESULT CALLED TO, READ BACK BY AND VERIFIED WITH: DR. [facility], AT 0715 03/07/17 BY D. VANHOOK    Streptococcus species NOT DETECTED NOT DETECTED Final    Streptococcus agalactiae NOT DETECTED NOT DETECTED Final   Streptococcus pneumoniae NOT DETECTED NOT DETECTED Final   Streptococcus pyogenes NOT DETECTED NOT DETECTED Final   Acinetobacter baumannii NOT DETECTED NOT DETECTED Final   Enterobacteriaceae species NOT DETECTED NOT DETECTED  Final   Enterobacter cloacae complex NOT DETECTED NOT DETECTED Final   Escherichia coli NOT DETECTED NOT DETECTED Final   Klebsiella oxytoca NOT DETECTED NOT DETECTED Final   Klebsiella pneumoniae NOT DETECTED NOT DETECTED Final   Proteus species NOT DETECTED NOT DETECTED Final   Serratia marcescens NOT DETECTED NOT DETECTED Final   Haemophilus influenzae NOT DETECTED NOT DETECTED Final   Neisseria meningitidis NOT DETECTED NOT DETECTED Final   Pseudomonas aeruginosa NOT DETECTED NOT DETECTED Final   Candida albicans NOT DETECTED NOT DETECTED Final   Candida glabrata NOT DETECTED NOT DETECTED Final   Candida krusei NOT DETECTED NOT DETECTED Final   Candida parapsilosis NOT DETECTED NOT DETECTED Final   Candida tropicalis NOT DETECTED NOT DETECTED Final    Comment: Performed at [facility], [place] [address] [place], [place] [address]  Culture, blood (routine x 2)     Status: None (Preliminary result)   Collection Time: 03/06/17  2:51 PM  Result Value Ref Range Status   Specimen Description BLOOD LEFT ARM  Final   Special Requests IN PEDIATRIC BOTTLE Blood Culture adequate volume  Final   Culture   Final    NO GROWTH < 24 HOURS Performed at [facility], [place] [address], [place], [place] [address]    Report Status PENDING  Incomplete    Microbiology: Recent Results (from the past 240 hour(s))  Culture, blood (routine x 2)     Status: Abnormal (Preliminary result)   Collection Time: 03/06/17  1:55 AM  Result Value Ref Range Status   Specimen Description BLOOD LEFT ANTECUBITAL  Final   Special Requests   Final    BOTTLES DRAWN AEROBIC AND ANAEROBIC Blood Culture adequate volume   Culture   Setup Time   Final    GRAM POSITIVE COCCI IN CLUSTERS IN BOTH AEROBIC AND ANAEROBIC BOTTLES CRITICAL RESULT CALLED TO, READ BACK BY AND VERIFIED WITH: DR. [facility], AT 0715 03/07/17 BY D. VANHOOK    Culture (A)  Final    STAPHYLOCOCCUS AUREUS SUSCEPTIBILITIES TO FOLLOW Performed at [facility], [place] [address], [place], [place] [address]    Report Status PENDING  Incomplete  Blood Culture ID Panel (Reflexed)     Status: Abnormal   Collection Time: 03/06/17  1:55 AM  Result Value Ref Range Status   Enterococcus species NOT DETECTED NOT DETECTED Final   Listeria monocytogenes NOT DETECTED NOT DETECTED Final   Staphylococcus species DETECTED (A) NOT DETECTED Final    Comment: CRITICAL RESULT CALLED TO, READ BACK BY AND VERIFIED WITH: DR. [facility], AT 0715 03/07/17 BY D. VANHOOK    Staphylococcus aureus DETECTED (A) NOT DETECTED Final    Comment: Methicillin (oxacillin)-resistant Staphylococcus aureus (MRSA). MRSA is predictably resistant to beta-lactam antibiotics (except ceftaroline). Preferred therapy is vancomycin unless clinically contraindicated. Patient requires contact precautions if  hospitalized. CRITICAL RESULT CALLED TO, READ BACK BY AND VERIFIED WITH: DR. [facility], AT 0715 03/07/17 BY D. VANHOOK    Methicillin resistance DETECTED (A) NOT DETECTED Final    Comment: CRITICAL RESULT CALLED TO, READ BACK BY AND VERIFIED WITH: DR. [facility], AT 0715 03/07/17 BY D. VANHOOK    Streptococcus species NOT DETECTED NOT DETECTED Final   Streptococcus agalactiae NOT DETECTED NOT DETECTED Final   Streptococcus pneumoniae NOT DETECTED NOT DETECTED Final   Streptococcus pyogenes NOT DETECTED NOT DETECTED Final   Acinetobacter baumannii NOT DETECTED NOT DETECTED Final   Enterobacteriaceae species NOT DETECTED NOT DETECTED Final   Enterobacter  cloacae complex NOT DETECTED NOT DETECTED Final   Escherichia coli NOT DETECTED NOT DETECTED Final   Klebsiella oxytoca NOT DETECTED NOT  DETECTED Final   Klebsiella pneumoniae NOT DETECTED NOT DETECTED Final   Proteus species NOT DETECTED NOT DETECTED Final   Serratia marcescens NOT DETECTED NOT DETECTED Final   Haemophilus influenzae NOT DETECTED NOT DETECTED Final   Neisseria meningitidis NOT DETECTED NOT DETECTED Final   Pseudomonas aeruginosa NOT DETECTED NOT DETECTED Final   Candida albicans NOT DETECTED NOT DETECTED Final   Candida glabrata NOT DETECTED NOT DETECTED Final   Candida krusei NOT DETECTED NOT DETECTED Final   Candida parapsilosis NOT DETECTED NOT DETECTED Final   Candida tropicalis NOT DETECTED NOT DETECTED Final    Comment: Performed at [facility], [place] [address] [place], [place] [address]  Culture, blood (routine x 2)     Status: None (Preliminary result)   Collection Time: 03/06/17  2:51 PM  Result Value Ref Range Status   Specimen Description BLOOD LEFT ARM  Final   Special Requests IN PEDIATRIC BOTTLE Blood Culture adequate volume  Final   Culture   Final    NO GROWTH < 24 HOURS Performed at [facility], [place] [address], [place], [place] [address]    Report Status PENDING  Incomplete    Bobby Rumpf, MD [facility] [phone] 03/07/2017, 5:28 PM

## 2017-03-07 NOTE — Progress Notes (Signed)
Pharmacy Antibiotic Note  Joseph Nunez is a 20 y.o. male re-admitted on 03/07/2017 with bacteremia.  Pharmacy has been consulted for Vancomycin dosing. IV drug abuse: Cocaine, Heroin. ETOH negative  Left-sided pectoral swelling, erythema and pain admitted on 03/05/2017 with sepsis d/t abscess/cellulitis. Begun on azactam and vancomycin, patient left AMA 10/17, had received one dose Vancomycin in ED.   Blood Cx resulted as MRSA, patient contacted, returned to hospital 10/18 for IV abx, plan TTE, ID to round on patient.  Plan: Vancomycin 1500mg  x1, then 1gm IV every 12 hours.  Goal trough 15-20 mcg/mL.   Patient not allergic to PCN, erroneous allergy entry    No data recorded.   Recent Labs Lab 03/05/17 2015 03/05/17 2025 03/06/17 0515  WBC 15.8*  --   --   CREATININE 0.65  --  0.60*  LATICACIDVEN  --  1.45  --     Estimated Creatinine Clearance: 128.1 mL/min (A) (by C-G formula based on SCr of 0.6 mg/dL (L)).   No Active Allergies  Antimicrobials this admission: 10/18 Vanc >>   Dose adjustments this admission:  Microbiology results: 10/17 BCx: BCID 1/2 resulted Staph aureus, Methicillin resistant  Thank you for allowing pharmacy to be a part of this patient's care.  Minda Ditto PharmD Pager [phone] 03/07/2017, 10:56 AM

## 2017-03-07 NOTE — H&P (Addendum)
TRH H&P   Patient Demographics:    Joseph Nunez, is a 21 y.o. male  MRN: [id number]   DOB - April 24, 1996  Admit Date - 03/07/2017  Outpatient Primary MD for the patient is Patient, No Pcp Per  Referring MD/NP/PA: direct admission after he signed AMA yesterday  Patient coming from:home   chief complaints:called back for positive blood cultures    HPI:    Joseph Nunez  is a 21 y.o. male, ith past medical history of polysubstance abuse, he reports heroin, tobacco, cocaine, IV drugs, patient was admitted 03/06/2017, for upper chest wall cellulitis with Pyomyositis, was started empirically on IV vancomycin and Azactam, he was smoking in his room, asking only for IV pain medication, he did sign AMA the same day, his blood cultures came back positive for MRSA bacteremia this a.m., I have called patient to come back for direct admission for treatment of his bacteremia, and reports fever and chills overnight, report generalized weakness, denies chest pain, nausea vomiting or diarrhea, reports mostly skeletal chest pain at cellulitis site, reports he injects drugs regularly.    Review of systems:    In addition to the HPI above,  Ports fever and chills No Headache, No changes with Vision or hearing, No problems swallowing food or Liquids, No Chest pain, Cough or Shortness of Breath, No Abdominal pain, No Nausea or Vommitting, Bowel movements are regular, No Blood in stool or Urine, No dysuria, No new skin rashes or bruises, No new joints pains-aches,  No new weakness, tingling, numbness in any extremity,reports pain at pyomyositis side and left chest No recent weight gain or loss, No polyuria, polydypsia or polyphagia, No significant Mental Stressors.  A full 10 point Review of Systems was done, except as stated above, all other Review of Systems were negative.   With  Past History of the following :    Past Medical History:  Diagnosis Date  . Cocaine abuse ([place])   . Heroin abuse ([place])   . IVDU (intravenous drug user)   . Tobacco abuse       History reviewed. No pertinent surgical history.    Social History:     Social History  Substance Use Topics  . Smoking status: Current Every Day Smoker  . Smokeless tobacco: Never Used  . Alcohol use No     Lives - at home with mother  Mobility - independent     Family History :     Family History  Problem Relation Age of Onset  . Heart disease Mother   . Diabetes Mellitus II Sister       Home Medications:   Prior to Admission medications   Medication Sig Start Date End Date Taking? Authorizing Provider  ibuprofen (ADVIL,MOTRIN) 200 MG tablet Take 400 mg by mouth every 6 (six) hours as needed for fever, headache, mild pain, moderate pain or cramping.   Yes  [provider]  sulfamethoxazole-trimethoprim (BACTRIM DS,SEPTRA DS) 800-160 MG tablet Take 1 tablet by mouth 2 (two) times daily. Patient taking differently: Take 1 tablet by mouth 2 (two) times daily. Started 10/17 for 10 days 03/06/17  Yes Elgergawy, Silver Huguenin, MD     Allergies:    No Known Allergies   Physical Exam:   Vitals  Blood pressure 134/82, pulse (!) 108, temperature (!) 100.8 F (38.2 C), resp. rate 20, height 5' 5" (1.651 m), weight 61.2 kg (135 lb), SpO2 100 %.   1. General male, sitting in bed in no apparent distress  2. Normal affect and insight, Not Suicidal or Homicidal, Awake Alert, Oriented X 3.  3. No F.N deficits, ALL C.Nerves Intact, Strength 5/5 all 4 extremities, Sensation intact all 4 extremities, Plantars down going.  4. Ears and Eyes appear Normal, Conjunctivae clear, PERRLA. Moist Oral Mucosa.  5. Supple Neck, No JVD, No cervical lymphadenopathy appriciated, No Carotid Bruits.  6. Symmetrical Chest wall movement, Good air movement bilaterally, CTAB.  7. RRR, No Gallops, Rubs or  Murmurs, No Parasternal Heave.shunt with some erythema in left upper chest wall, minimal swelling and tenderness to palpation  8. Positive Bowel Sounds, Abdomen Soft, No tenderness, No organomegaly appriciated,No rebound -guarding or rigidity.  9.  No Cyanosis, Normal Skin Turgor, No Skin Rash or Bruise.  10. Good muscle tone,  joints appear normal , no effusions, Normal ROM.  11. No Palpable Lymph Nodes in Neck or Axillae     Data Review:    CBC  Recent Labs Lab 03/05/17 2015 03/07/17 1215  WBC 15.8* 20.1*  HGB 12.9* 12.4*  HCT 38.1* 36.1*  PLT 237 327  MCV 86.4 86.4  MCH 29.3 29.7  MCHC 33.9 34.3  RDW 12.7 12.9  LYMPHSABS 1.4  --   MONOABS 1.2*  --   EOSABS 0.1  --   BASOSABS 0.0  --    ------------------------------------------------------------------------------------------------------------------  Chemistries   Recent Labs Lab 03/05/17 2015 03/06/17 0515 03/07/17 1215  NA 133* 135 138  K 4.2 4.3 3.8  CL 96* 102 99*  CO2 _0 GLUCOSE 154* 125* 121*  BUN 6 <5* 6  CREATININE 0.65 0.60* 0.60*  CALCIUM 8.6* 8.3* 9.0  AST 13*  --  16  ALT 12*  --  13*  ALKPHOS 85  --  94  BILITOT 0.5  --  0.6   ------------------------------------------------------------------------------------------------------------------ estimated creatinine clearance is 127.5 mL/min (A) (by C-G formula based on SCr of 0.6 mg/dL (L)). ------------------------------------------------------------------------------------------------------------------ No results for input(s): TSH, T4TOTAL, T3FREE, THYROIDAB in the last 72 hours.  Invalid input(s): FREET3  Coagulation profile No results for input(s): INR, PROTIME in the last 168 hours. ------------------------------------------------------------------------------------------------------------------- No results for input(s): DDIMER in the last 72  hours. -------------------------------------------------------------------------------------------------------------------  Cardiac Enzymes No results for input(s): CKMB, TROPONINI, MYOGLOBIN in the last 168 hours.  Invalid input(s): CK ------------------------------------------------------------------------------------------------------------------ No results found for: BNP   ---------------------------------------------------------------------------------------------------------------  Urinalysis    Component Value Date/Time   COLORURINE YELLOW 03/05/2017 1900   APPEARANCEUR CLEAR 03/05/2017 1900   LABSPEC 1.017 03/05/2017 1900   PHURINE 6.0 03/05/2017 1900   GLUCOSEU NEGATIVE 03/05/2017 1900   HGBUR LARGE (A) 03/05/2017 [place] NEGATIVE 03/05/2017 [place] NEGATIVE 03/05/2017 1900   PROTEINUR NEGATIVE 03/05/2017 1900   NITRITE NEGATIVE 03/05/2017 1900   LEUKOCYTESUR NEGATIVE 03/05/2017 1900    ----------------------------------------------------------------------------------------------------------------   Imaging Results:    Dg Chest 2 View  Result Date: 03/05/2017  CLINICAL DATA:  Chest pain EXAM: CHEST  2 VIEW COMPARISON:  None. FINDINGS: Lungs are clear. Heart size and pulmonary vascularity are normal. No adenopathy. No pneumothorax. No bone lesions. IMPRESSION: No edema or consolidation. Electronically Signed   By: Lowella Grip III M.D.   On: 03/05/2017 19:09   Dg Sternum  Result Date: 03/05/2017 CLINICAL DATA:  Pain after twisting injury.  Heroin user. EXAM: STERNUM - 1+ VIEW COMPARISON:  Chest radiograph of earlier today. FINDINGS: Single lateral view of the sternum demonstrates no focal osseous abnormality. No retrosternal soft tissue mass. No subcutaneous gas or osseous destruction. IMPRESSION: No acute osseous abnormality. Electronically Signed   By: Abigail Miyamoto M.D.   On: 03/05/2017 19:28   Ct Chest W Contrast  Result Date:  03/06/2017 CLINICAL DATA:  Patient developed pectoral pain on the right after throwing an orange. Leukocytosis. EXAM: CT CHEST WITH CONTRAST TECHNIQUE: Multidetector CT imaging of the chest was performed during intravenous contrast administration. CONTRAST:  75 cc Isovue-300 COMPARISON:  None. FINDINGS: Cardiovascular: Normal heart size without pericardial effusion. Normal ao aortic caliber. No large central pulmonary embolus though the study is not tailored for assessment of pulmonary emboli. Mediastinum/Nodes: No enlarged mediastinal, hilar, or axillary lymph nodes. Thyroid gland, trachea, and esophagus demonstrate no significant findings. Lungs/Pleura: 4 mm left upper lobe subpleural nodule. No pneumonic consolidation, effusion or pneumothorax. Upper Abdomen: No acute abnormality. Musculoskeletal: Asymmetric enlargement with subtle intramuscular heterogeneous enhancement of the left pectoralis muscle relative to right. Findings are suspicious for pyomyositis. No intramuscular hemorrhage. There is overlying soft tissue induration and inflammation though the anterior chest wall extending to the upper abdomen. IMPRESSION: 1. Asymmetric enlargement of the left pectoralis muscle relative to right. There is subtle intramuscular enhancement suspicious for a developing pyomyositis of the left pectoralis. Overlying subcutaneous fatty induration is seen of the anterior chest wall that may reflect a cellulitis. These results were called by telephone at the time of interpretation on 03/06/2017 at 1:05 am to [place], who verbally acknowledged these results. 2. 4 mm left upper lobe subpleural nodule. No follow-up needed if patient is low-risk. Non-contrast chest CT can be considered in 12 months if patient is high-risk. This recommendation follows the consensus statement: Guidelines for Management of Incidental Pulmonary Nodules Detected on CT Images: From the Fleischner Society 2017; Radiology 2017; 284:228-243.  Electronically Signed   By: Ashley Royalty M.D.   On: 03/06/2017 01:06      Assessment & Plan:    Active Problems:   Bacteremia   Sepsis due toMRSA bacteremia - patient fever, leukocytosis and tachycardia, sepsis criteria met on admission - This is most likely related to IV drug abuse,low blood culture growing MRSA bacteremia, will continue with IV Zosyn, blood cultures repeated this admission, continue with IV Zosyn for now, will obtain 2-D echo, ID consulted regarding further recommendation. - he had urinalysis, CT chest done yesterday, so no need to repeat  Upper chest wall cellulitis and pyomyositis - Likely due to seeding from bacteremia, continue with IV antibiotics  Polysubstance abuse/IV drug abuse - we'll send HIV and hepatitis C - he was counseled, will start on when necessaryAtarax and when necessary Ativan  Tobacco abuse - Counseled, started on nicotine patch   58m left upper lobe subpleural nodule: -Patient has this incidental finding on CT of the chest, Does not appears to be infectious, will continue to monitor    DVT Prophylaxis Heparin -  Lovenox - SCD  AM Labs Ordered, also please  review Full Orders  Family Communication: Admission, patients condition and plan of care including tests being ordered have been discussed with the patient and mother who indicate understanding and agree with the plan and Code Status.  Code Status full  Likely DC to  Pending further work up.  Condition GUARDED    Consults called: ID    Admission status: inpatient  Time spent in minutes :55 minutes   ELGERGAWY, DAWOOD M.D on 03/07/2017 at 4:43 PM  Between 7am to 7pm - Pager - [phone]. After 7pm go to [email or web address] - password [facility]  [facility] - Office  [phone]

## 2017-03-08 ENCOUNTER — Encounter (HOSPITAL_COMMUNITY)
Admission: AD | Disposition: A | Discharge status: Home / Self Care | Payer: Self-pay | Source: Ambulatory Visit | Attending: Internal Medicine

## 2017-03-08 ENCOUNTER — Inpatient Hospital Stay (HOSPITAL_COMMUNITY): Payer: Medicaid Other | Admitting: Registered Nurse

## 2017-03-08 ENCOUNTER — Encounter (HOSPITAL_COMMUNITY): Payer: Self-pay | Admitting: Cardiovascular Disease

## 2017-03-08 ENCOUNTER — Inpatient Hospital Stay (HOSPITAL_COMMUNITY): Payer: Medicaid Other

## 2017-03-08 ENCOUNTER — Inpatient Hospital Stay (HOSPITAL_COMMUNITY): Payer: Medicaid Other | Admitting: Anesthesiology

## 2017-03-08 DIAGNOSIS — R7881 Bacteremia: Secondary | ICD-10-CM

## 2017-03-08 DIAGNOSIS — L02213 Cutaneous abscess of chest wall: Secondary | ICD-10-CM

## 2017-03-08 HISTORY — PX: TEE WITHOUT CARDIOVERSION: SHX5443

## 2017-03-08 HISTORY — PX: IRRIGATION AND DEBRIDEMENT ABSCESS: SHX5252

## 2017-03-08 LAB — CBC
HEMATOCRIT: 36.4 % — AB (ref 39.0–52.0)
Hemoglobin: 12 g/dL — ABNORMAL LOW (ref 13.0–17.0)
MCH: 28.5 pg (ref 26.0–34.0)
MCHC: 33 g/dL (ref 30.0–36.0)
MCV: 86.5 fL (ref 78.0–100.0)
PLATELETS: 287 10*3/uL (ref 150–400)
RBC: 4.21 MIL/uL — ABNORMAL LOW (ref 4.22–5.81)
RDW: 13.2 % (ref 11.5–15.5)
WBC: 17.8 10*3/uL — AB (ref 4.0–10.5)

## 2017-03-08 LAB — CULTURE, BLOOD (ROUTINE X 2): Special Requests: ADEQUATE

## 2017-03-08 LAB — BASIC METABOLIC PANEL
Anion gap: 10 (ref 5–15)
BUN: 6 mg/dL (ref 6–20)
CALCIUM: 8.5 mg/dL — AB (ref 8.9–10.3)
CO2: 26 mmol/L (ref 22–32)
CREATININE: 0.57 mg/dL — AB (ref 0.61–1.24)
Chloride: 99 mmol/L — ABNORMAL LOW (ref 101–111)
GFR calc Af Amer: >60 mL/min (ref >60)
Glucose, Bld: 130 mg/dL — ABNORMAL HIGH (ref 65–99)
POTASSIUM: 4.7 mmol/L (ref 3.5–5.1)
SODIUM: 135 mmol/L (ref 135–145)

## 2017-03-08 LAB — RPR: RPR Ser Ql: NONREACTIVE

## 2017-03-08 LAB — HEPATITIS PANEL, ACUTE
HEP A IGM: NEGATIVE
HEP B S AG: NEGATIVE
Hep B C IgM: NEGATIVE

## 2017-03-08 LAB — MRSA PCR SCREENING: MRSA BY PCR: NEGATIVE

## 2017-03-08 SURGERY — IRRIGATION AND DEBRIDEMENT ABSCESS
Anesthesia: General | Site: Chest

## 2017-03-08 SURGERY — ECHOCARDIOGRAM, TRANSESOPHAGEAL
Anesthesia: Monitor Anesthesia Care

## 2017-03-08 MED ORDER — LACTATED RINGERS IV SOLN
INTRAVENOUS | Status: DC | PRN
  Administered 2017-03-08: 13:00:00 via INTRAVENOUS

## 2017-03-08 MED ORDER — PIPERACILLIN-TAZOBACTAM 3.375 G IVPB
3.3750 g | Freq: Three times a day (TID) | INTRAVENOUS | Status: DC
  Administered 2017-03-08 – 2017-03-09 (×3): 3.375 g via INTRAVENOUS
  Filled 2017-03-08 (×4): qty 50

## 2017-03-08 MED ORDER — PROPOFOL 10 MG/ML IV BOLUS
INTRAVENOUS | Status: DC | PRN
  Administered 2017-03-08: 20 mg via INTRAVENOUS
  Administered 2017-03-08: 40 mg via INTRAVENOUS
  Administered 2017-03-08 (×2): 20 mg via INTRAVENOUS

## 2017-03-08 MED ORDER — DEXTROSE 5 % IV SOLN
1000.0000 mg | Freq: Four times a day (QID) | INTRAVENOUS | Status: DC | PRN
  Filled 2017-03-08: qty 10

## 2017-03-08 MED ORDER — MAGIC MOUTHWASH
15.0000 mL | Freq: Four times a day (QID) | ORAL | Status: DC | PRN
  Filled 2017-03-08: qty 15

## 2017-03-08 MED ORDER — HYDROCORTISONE 2.5 % RE CREA: 1.0000 "application " | TOPICAL_CREAM | Freq: Four times a day (QID) | RECTAL | Status: DC | PRN

## 2017-03-08 MED ORDER — FENTANYL CITRATE (PF) 100 MCG/2ML IJ SOLN
INTRAMUSCULAR | Status: AC
  Filled 2017-03-08: qty 2

## 2017-03-08 MED ORDER — CEFAZOLIN SODIUM-DEXTROSE 2-4 GM/100ML-% IV SOLN
INTRAVENOUS | Status: AC
  Filled 2017-03-08: qty 100

## 2017-03-08 MED ORDER — GUAIFENESIN-DM 100-10 MG/5ML PO SYRP: 10.0000 mL | ORAL_SOLUTION | ORAL | Status: DC | PRN

## 2017-03-08 MED ORDER — POLYETHYLENE GLYCOL 3350 17 G PO PACK
17.0000 g | PACK | Freq: Two times a day (BID) | ORAL | Status: DC | PRN
Start: 2017-03-08 — End: 2017-03-13

## 2017-03-08 MED ORDER — PROPOFOL 500 MG/50ML IV EMUL
INTRAVENOUS | Status: DC | PRN
  Administered 2017-03-08: 100 ug/kg/min via INTRAVENOUS

## 2017-03-08 MED ORDER — MORPHINE SULFATE (PF) 4 MG/ML IV SOLN
4.0000 mg | INTRAVENOUS | Status: DC | PRN
  Administered 2017-03-08 – 2017-03-11 (×17): 4 mg via INTRAVENOUS
  Filled 2017-03-08 (×17): qty 1

## 2017-03-08 MED ORDER — LIP MEDEX EX OINT
1.0000 "application " | TOPICAL_OINTMENT | Freq: Two times a day (BID) | CUTANEOUS | Status: DC
  Administered 2017-03-08 – 2017-03-12 (×8): 1 via TOPICAL
  Filled 2017-03-08 (×4): qty 7

## 2017-03-08 MED ORDER — ALUM & MAG HYDROXIDE-SIMETH 200-200-20 MG/5ML PO SUSP: 30.0000 mL | Freq: Four times a day (QID) | ORAL | Status: DC | PRN

## 2017-03-08 MED ORDER — METOCLOPRAMIDE HCL 5 MG/ML IJ SOLN: 5.0000 mg | Freq: Four times a day (QID) | INTRAMUSCULAR | Status: DC | PRN

## 2017-03-08 MED ORDER — CEFAZOLIN SODIUM-DEXTROSE 2-3 GM-%(50ML) IV SOLR
INTRAVENOUS | Status: DC | PRN
  Administered 2017-03-08: 2 g via INTRAVENOUS

## 2017-03-08 MED ORDER — BUPIVACAINE-EPINEPHRINE (PF) 0.25% -1:200000 IJ SOLN
INTRAMUSCULAR | Status: AC
  Filled 2017-03-08: qty 30

## 2017-03-08 MED ORDER — HYDROCORTISONE 1 % EX CREA
1.0000 "application " | TOPICAL_CREAM | Freq: Three times a day (TID) | CUTANEOUS | Status: DC | PRN
  Filled 2017-03-08: qty 28

## 2017-03-08 MED ORDER — BISACODYL 10 MG RE SUPP: 10.0000 mg | Freq: Two times a day (BID) | RECTAL | Status: DC | PRN

## 2017-03-08 MED ORDER — PROMETHAZINE HCL 25 MG/ML IJ SOLN: 6.2500 mg | INTRAMUSCULAR | Status: DC | PRN

## 2017-03-08 MED ORDER — PHENYLEPHRINE 40 MCG/ML (10ML) SYRINGE FOR IV PUSH (FOR BLOOD PRESSURE SUPPORT)
PREFILLED_SYRINGE | INTRAVENOUS | Status: DC | PRN
  Administered 2017-03-08: 80 ug via INTRAVENOUS

## 2017-03-08 MED ORDER — MIDAZOLAM HCL 5 MG/5ML IJ SOLN
INTRAMUSCULAR | Status: DC | PRN
  Administered 2017-03-08: 2 mg via INTRAVENOUS

## 2017-03-08 MED ORDER — PROPOFOL 10 MG/ML IV BOLUS
INTRAVENOUS | Status: DC | PRN
  Administered 2017-03-08 (×3): 50 mg via INTRAVENOUS
  Administered 2017-03-08: 200 mg via INTRAVENOUS
  Administered 2017-03-08: 50 mg via INTRAVENOUS

## 2017-03-08 MED ORDER — OXYCODONE HCL 5 MG PO TABS
5.0000 mg | ORAL_TABLET | ORAL | Status: DC | PRN
  Administered 2017-03-09 – 2017-03-12 (×7): 10 mg via ORAL
  Filled 2017-03-08 (×8): qty 2

## 2017-03-08 MED ORDER — DIPHENHYDRAMINE HCL 50 MG/ML IJ SOLN: 12.5000 mg | Freq: Four times a day (QID) | INTRAMUSCULAR | Status: DC | PRN

## 2017-03-08 MED ORDER — HYDROMORPHONE HCL-NACL 0.5-0.9 MG/ML-% IV SOSY
PREFILLED_SYRINGE | INTRAVENOUS | Status: AC
  Filled 2017-03-08: qty 2

## 2017-03-08 MED ORDER — PROPOFOL 10 MG/ML IV BOLUS
INTRAVENOUS | Status: AC
  Filled 2017-03-08: qty 20

## 2017-03-08 MED ORDER — LACTATED RINGERS IV SOLN: INTRAVENOUS | Status: DC

## 2017-03-08 MED ORDER — POLYETHYLENE GLYCOL 3350 17 G PO PACK
17.0000 g | PACK | Freq: Every day | ORAL | Status: DC
  Administered 2017-03-09: 17 g via ORAL
  Filled 2017-03-08 (×4): qty 1

## 2017-03-08 MED ORDER — BUPIVACAINE-EPINEPHRINE 0.25% -1:200000 IJ SOLN
INTRAMUSCULAR | Status: DC | PRN
  Administered 2017-03-08: 60 mL

## 2017-03-08 MED ORDER — FENTANYL CITRATE (PF) 100 MCG/2ML IJ SOLN
INTRAMUSCULAR | Status: DC | PRN
  Administered 2017-03-08 (×3): 50 ug via INTRAVENOUS

## 2017-03-08 MED ORDER — MIDAZOLAM HCL 2 MG/2ML IJ SOLN
INTRAMUSCULAR | Status: AC
  Filled 2017-03-08: qty 2

## 2017-03-08 MED ORDER — PROCHLORPERAZINE EDISYLATE 5 MG/ML IJ SOLN: 5.0000 mg | INTRAMUSCULAR | Status: DC | PRN

## 2017-03-08 MED ORDER — CEFAZOLIN SODIUM-DEXTROSE 2-4 GM/100ML-% IV SOLN: 2.0000 g | Freq: Once | INTRAVENOUS | Status: DC

## 2017-03-08 MED ORDER — MENTHOL 3 MG MT LOZG
1.0000 | LOZENGE | OROMUCOSAL | Status: DC | PRN
  Filled 2017-03-08: qty 9

## 2017-03-08 MED ORDER — HYDROMORPHONE HCL-NACL 0.5-0.9 MG/ML-% IV SOSY
0.2500 mg | PREFILLED_SYRINGE | INTRAVENOUS | Status: DC | PRN
  Administered 2017-03-08: 0.5 mg via INTRAVENOUS

## 2017-03-08 MED ORDER — ENSURE PRE-SURGERY PO LIQD: 296.0000 mL | Freq: Once | ORAL | Status: DC

## 2017-03-08 MED ORDER — ACETAMINOPHEN 500 MG PO TABS
1000.0000 mg | ORAL_TABLET | Freq: Three times a day (TID) | ORAL | Status: DC
  Administered 2017-03-08 – 2017-03-13 (×14): 1000 mg via ORAL
  Filled 2017-03-08 (×14): qty 2

## 2017-03-08 MED ORDER — FENTANYL CITRATE (PF) 100 MCG/2ML IJ SOLN: 25.0000 ug | INTRAMUSCULAR | Status: DC | PRN

## 2017-03-08 MED ORDER — GABAPENTIN 300 MG PO CAPS
300.0000 mg | ORAL_CAPSULE | Freq: Every day | ORAL | Status: AC
  Administered 2017-03-08 – 2017-03-10 (×3): 300 mg via ORAL
  Filled 2017-03-08 (×3): qty 1

## 2017-03-08 MED ORDER — METHOCARBAMOL 500 MG PO TABS: 1000.0000 mg | ORAL_TABLET | Freq: Four times a day (QID) | ORAL | Status: DC | PRN

## 2017-03-08 MED ORDER — LACTATED RINGERS IV SOLN
INTRAVENOUS | Status: DC | PRN
  Administered 2017-03-08: 18:00:00 via INTRAVENOUS

## 2017-03-08 MED ORDER — LIDOCAINE 2% (20 MG/ML) 5 ML SYRINGE
INTRAMUSCULAR | Status: DC | PRN
  Administered 2017-03-08: 40 mg via INTRAVENOUS

## 2017-03-08 MED ORDER — 0.9 % SODIUM CHLORIDE (POUR BTL) OPTIME
TOPICAL | Status: DC | PRN
  Administered 2017-03-08: 2000 mL

## 2017-03-08 MED ORDER — IBUPROFEN 800 MG PO TABS
800.0000 mg | ORAL_TABLET | Freq: Four times a day (QID) | ORAL | Status: DC
  Administered 2017-03-08 – 2017-03-13 (×18): 800 mg via ORAL
  Filled 2017-03-08 (×18): qty 1

## 2017-03-08 MED ORDER — PHENOL 1.4 % MT LIQD
1.0000 | OROMUCOSAL | Status: DC | PRN
  Filled 2017-03-08: qty 177

## 2017-03-08 MED ORDER — SODIUM CHLORIDE 0.9 % IV SOLN
25.0000 mg | Freq: Four times a day (QID) | INTRAVENOUS | Status: DC | PRN
  Filled 2017-03-08: qty 1

## 2017-03-08 MED ORDER — LIDOCAINE 2% (20 MG/ML) 5 ML SYRINGE
INTRAMUSCULAR | Status: DC | PRN
  Administered 2017-03-08: 80 mg via INTRAVENOUS

## 2017-03-08 MED ORDER — DEXAMETHASONE SODIUM PHOSPHATE 10 MG/ML IJ SOLN
INTRAMUSCULAR | Status: DC | PRN
  Administered 2017-03-08: 10 mg via INTRAVENOUS

## 2017-03-08 MED ORDER — LACTATED RINGERS IV BOLUS (SEPSIS): 1000.0000 mL | Freq: Three times a day (TID) | INTRAVENOUS | Status: AC | PRN

## 2017-03-08 SURGICAL SUPPLY — 36 items
BENZOIN TINCTURE PRP APPL 2/3 (GAUZE/BANDAGES/DRESSINGS) ×3 IMPLANT
BLADE HEX COATED 2.75 (ELECTRODE) ×3 IMPLANT
BLADE SURG 15 STRL LF DISP TIS (BLADE) IMPLANT
BLADE SURG 15 STRL SS (BLADE)
BLADE SURG SZ10 CARB STEEL (BLADE) ×6 IMPLANT
CLOSURE WOUND 1/2 X4 (GAUZE/BANDAGES/DRESSINGS) ×1
CONT SPEC 4OZ CLIKSEAL STRL BL (MISCELLANEOUS) ×3 IMPLANT
COVER SURGICAL LIGHT HANDLE (MISCELLANEOUS) ×3 IMPLANT
DECANTER SPIKE VIAL GLASS SM (MISCELLANEOUS) ×3 IMPLANT
DRAIN PENROSE 18X1/2 LTX STRL (DRAIN) ×3 IMPLANT
DRAPE LAPAROTOMY TRNSV 102X78 (DRAPE) ×3 IMPLANT
ELECT PENCIL ROCKER SW 15FT (MISCELLANEOUS) ×3 IMPLANT
ELECT REM PT RETURN 15FT ADLT (MISCELLANEOUS) ×3 IMPLANT
GAUZE SPONGE 4X4 12PLY STRL (GAUZE/BANDAGES/DRESSINGS) ×3 IMPLANT
GLOVE BIOGEL PI IND STRL 7.0 (GLOVE) ×1 IMPLANT
GLOVE BIOGEL PI INDICATOR 7.0 (GLOVE) ×2
GLOVE SURG SIGNA 7.5 PF LTX (GLOVE) ×3 IMPLANT
GOWN SPEC L4 XLG W/TWL (GOWN DISPOSABLE) ×3 IMPLANT
GOWN STRL REUS W/ TWL XL LVL3 (GOWN DISPOSABLE) ×3 IMPLANT
GOWN STRL REUS W/TWL LRG LVL3 (GOWN DISPOSABLE) ×3 IMPLANT
GOWN STRL REUS W/TWL XL LVL3 (GOWN DISPOSABLE) ×6
KIT BASIN OR (CUSTOM PROCEDURE TRAY) ×3 IMPLANT
NDL SAFETY ECLIPSE 18X1.5 (NEEDLE) ×1 IMPLANT
NEEDLE HYPO 18GX1.5 SHARP (NEEDLE) ×2
NEEDLE HYPO 25X1 1.5 SAFETY (NEEDLE) ×3 IMPLANT
NS IRRIG 1000ML POUR BTL (IV SOLUTION) ×3 IMPLANT
PACK BASIC VI WITH GOWN DISP (CUSTOM PROCEDURE TRAY) ×3 IMPLANT
PAD ABD 8X10 STRL (GAUZE/BANDAGES/DRESSINGS) ×3 IMPLANT
SPONGE LAP 4X18 X RAY DECT (DISPOSABLE) ×6 IMPLANT
STRIP CLOSURE SKIN 1/2X4 (GAUZE/BANDAGES/DRESSINGS) ×2 IMPLANT
SYR 10ML LL (SYRINGE) ×3 IMPLANT
SYR BULB IRRIGATION 50ML (SYRINGE) ×3 IMPLANT
SYR CONTROL 10ML LL (SYRINGE) ×3 IMPLANT
TAPE CLOTH SURG 4X10 WHT LF (GAUZE/BANDAGES/DRESSINGS) ×3 IMPLANT
TOWEL OR 17X26 10 PK STRL BLUE (TOWEL DISPOSABLE) ×3 IMPLANT
YANKAUER SUCT BULB TIP 10FT TU (MISCELLANEOUS) ×3 IMPLANT

## 2017-03-08 NOTE — Progress Notes (Signed)
PROGRESS NOTE                                                                                                                                                                                                             Patient Demographics:    Joseph Nunez, is a 21 y.o. male, DOB - [birth year], FXT:[id number]  Admit date - 03/07/2017   Admitting Physician Albertine Patricia, MD  Outpatient Primary MD for the patient is Patient, No Pcp Per  LOS - 1   No chief complaint on file.      Brief Narrative    21 y.o. male, ith past medical history of polysubstance abuse, he reports heroin, tobacco, cocaine, IV drugs, patient was admitted 03/06/2017, for upper chest wall cellulitis with Pyomyositis, was started empirically on IV vancomycin and Azactam, he was smoking in his room, asking only for IV pain medication, he did sign AMA the same day, his blood cultures came back positive for MRSA bacteremia , I have called patient to come back for direct admission for treatment of his bacteremia,TEE 03/08/2017 with no evidence of endocarditis, surgery consulted regarding left chest wall abscess with plan for surgical intervention.   Subjective:    Joseph Nunez today fever overnight, reports left chest wall pain, no nausea or vomiting.has, No headache, complains of left chest wall pain    Assessment  & Plan :    Active Problems:   IVDU (intravenous drug user)   Pyomyositis   Cellulitis of pectoral region   Cocaine abuse ([place])   Tobacco abuse   Bacteremia   Sepsis due to MRSA bacteremia/left pectoralis muscle cellulitis, P myositis and abscess - patient fever, leukocytosis and tachycardia, sepsis criteria met on admission - This is most likely related to IV drug abuse, blood culture growing MRSA bacteremia, continue with IV vancomycin. - repeat blood culture this admission showing gram-positive cocci in clusters - TEE 03/08/2017 negative for  endocarditis - ID consult greatly appreciated  Left Upper chest wall cellulitis Tresa Garter - Likely due to seeding from bacteremia, continue with IV antibiotics - surgery consulted, likely will need I&D. Today or tomorrow - on when necessary IV morphine for pain  Polysubstance abuse/IV drug abuse - he was counseled, will start on when necessaryAtarax and when necessary Ativan  Tobacco abuse - Counseled, started on nicotine patch  64m  left upper lobe subpleural nodule: -Patient has this incidental finding on CT of the chest, Does not appears to be infectious, will continue to monitor    Code Status : Full  Family Communication  : discussed with mother at bedside 03/07/2017  Disposition Plan  : Home when stable  Consults  : surgery, ID  Procedures  : TEE 03/08/2017  DVT Prophylaxis  :  Lovenox  Lab Results  Component Value Date   PLT 287 03/08/2017    Antibiotics  :    Anti-infectives    Start     Dose/Rate Route Frequency Ordered Stop   03/07/17 2000  [MAR Hold]  vancomycin (VANCOCIN) IVPB 1000 mg/200 mL premix     (MAR Hold since 03/08/17 1108)   1,000 mg 200 mL/hr over 60 Minutes Intravenous Every 8 hours 03/07/17 1346     03/07/17 1800  vancomycin (VANCOCIN) IVPB 1000 mg/200 mL premix  Status:  Discontinued     1,000 mg 200 mL/hr over 60 Minutes Intravenous Every 8 hours 03/07/17 1047 03/07/17 1346   03/07/17 1100  vancomycin (VANCOCIN) 1,500 mg in sodium chloride 0.9 % 500 mL IVPB     1,500 mg 250 mL/hr over 120 Minutes Intravenous  Once 03/07/17 1044 03/07/17 1537        Objective:   Vitals:   03/08/17 0636 03/08/17 1117 03/08/17 1355 03/08/17 1410  BP: 132/72 125/89 (!) 118/45 123/76  Pulse: (!) 109 (!) 114  (!) 108  Resp: 20 (!) _0 Temp: (!) 100.5 F (38.1 C)  99.9 F (37.7 C)   TempSrc: Oral  Oral   SpO2: 100% 100% 100% 95%  Weight:  61.2 kg (135 lb)    Height:  _1  (1.651 m)      Wt Readings from Last 3 Encounters:  03/08/17  61.2 kg (135 lb)  03/06/17 65.8 kg (145 lb)     Intake/Output Summary (Last 24 hours) at 03/08/17 1444 Last data filed at 03/08/17 1355  Gross per 24 hour  Intake             3510 ml  Output              300 ml  Net             3210 ml     Physical Exam  Awake alert 3, sitting in bed in mild discomfort secondary to pain Good air entry bilaterally, no wheezing rales rhonchi RRR,No Gallops,Rubs or new Murmurs, No Parasternal Heave,  Abdomen soft, nontender, nondistended, bowel sounds present No Cyanosis, Clubbing or edema, No new Rash or bruise  Left upper chest wall with tenderness to palpation, some erythema, worsening swelling today    Data Review:    CBC  Recent Labs Lab 03/05/17 2015 03/07/17 1215 03/08/17 0417  WBC 15.8* 20.1* 17.8*  HGB 12.9* 12.4* 12.0*  HCT 38.1* 36.1* 36.4*  PLT 237 327 287  MCV 86.4 86.4 86.5  MCH 29.3 29.7 28.5  MCHC 33.9 34.3 33.0  RDW 12.7 12.9 13.2  LYMPHSABS 1.4  --   --   MONOABS 1.2*  --   --   EOSABS 0.1  --   --   BASOSABS 0.0  --   --     Chemistries   Recent Labs Lab 03/05/17 2015 03/06/17 0515 03/07/17 1215 03/08/17 0417  NA 133* 135 138 135  K 4.2 4.3 3.8 4.7  CL 96* 102 99* 99*  CO2 _2 26  GLUCOSE 154* 125* 121* 130*  BUN 6 <5* 6 6  CREATININE 0.65 0.60* 0.60* 0.57*  CALCIUM 8.6* 8.3* 9.0 8.5*  AST 13*  --  16  --   ALT 12*  --  13*  --   ALKPHOS 85  --  94  --   BILITOT 0.5  --  0.6  --    ------------------------------------------------------------------------------------------------------------------ No results for input(s): CHOL, HDL, LDLCALC, TRIG, CHOLHDL, LDLDIRECT in the last 72 hours.  No results found for: HGBA1C ------------------------------------------------------------------------------------------------------------------ No results for input(s): TSH, T4TOTAL, T3FREE, THYROIDAB in the last 72 hours.  Invalid input(s):  FREET3 ------------------------------------------------------------------------------------------------------------------ No results for input(s): VITAMINB12, FOLATE, FERRITIN, TIBC, IRON, RETICCTPCT in the last 72 hours.  Coagulation profile No results for input(s): INR, PROTIME in the last 168 hours.  No results for input(s): DDIMER in the last 72 hours.  Cardiac Enzymes No results for input(s): CKMB, TROPONINI, MYOGLOBIN in the last 168 hours.  Invalid input(s): CK ------------------------------------------------------------------------------------------------------------------ No results found for: BNP  Inpatient Medications  Scheduled Meds: . [MAR Hold] enoxaparin (LOVENOX) injection  40 mg Subcutaneous Q24H  . [MAR Hold] nicotine  21 mg Transdermal Daily  . [MAR Hold] sodium chloride flush  3 mL Intravenous Q12H   Continuous Infusions: . sodium chloride 100 mL/hr at 03/07/17 1703  . [MAR Hold] vancomycin Stopped (03/08/17 0536)   PRN Meds:.[MAR Hold] acetaminophen **OR** [MAR Hold] acetaminophen, [MAR Hold] hydrOXYzine, [MAR Hold] ketorolac, [MAR Hold] LORazepam, [MAR Hold]  morphine injection, [MAR Hold] oxyCODONE  Micro Results Recent Results (from the past 240 hour(s))  Culture, blood (routine x 2)     Status: Abnormal   Collection Time: 03/06/17  1:55 AM  Result Value Ref Range Status   Specimen Description BLOOD LEFT ANTECUBITAL  Final   Special Requests   Final    BOTTLES DRAWN AEROBIC AND ANAEROBIC Blood Culture adequate volume   Culture  Setup Time   Final    GRAM POSITIVE COCCI IN CLUSTERS IN BOTH AEROBIC AND ANAEROBIC BOTTLES CRITICAL RESULT CALLED TO, READ BACK BY AND VERIFIED WITH: DR. [facility]ron [facility], AT 0715 03/07/17 BY Rush Landmark Performed at [facility], [place] [address] [place], [place] [address]    Culture METHICILLIN RESISTANT STAPHYLOCOCCUS AUREUS (A)  Final   Report Status 03/08/2017 FINAL  Final   Organism ID, Bacteria METHICILLIN RESISTANT  STAPHYLOCOCCUS AUREUS  Final      Susceptibility   Methicillin resistant staphylococcus aureus - MIC*    CIPROFLOXACIN <=0.5 SENSITIVE Sensitive     ERYTHROMYCIN >=8 RESISTANT Resistant     GENTAMICIN <=0.5 SENSITIVE Sensitive     OXACILLIN >=4 RESISTANT Resistant     TETRACYCLINE <=1 SENSITIVE Sensitive     VANCOMYCIN <=0.5 SENSITIVE Sensitive     TRIMETH/SULFA <=10 SENSITIVE Sensitive     CLINDAMYCIN <=0.25 SENSITIVE Sensitive     RIFAMPIN <=0.5 SENSITIVE Sensitive     Inducible Clindamycin NEGATIVE Sensitive     * METHICILLIN RESISTANT STAPHYLOCOCCUS AUREUS  Blood Culture ID Panel (Reflexed)     Status: Abnormal   Collection Time: 03/06/17  1:55 AM  Result Value Ref Range Status   Enterococcus species NOT DETECTED NOT DETECTED Final   Listeria monocytogenes NOT DETECTED NOT DETECTED Final   Staphylococcus species DETECTED (A) NOT DETECTED Final    Comment: CRITICAL RESULT CALLED TO, READ BACK BY AND VERIFIED WITH: DR. [facility], AT 0715 03/07/17 BY D. VANHOOK    Staphylococcus aureus DETECTED (A) NOT DETECTED Final    Comment: Methicillin (oxacillin)-resistant  Staphylococcus aureus (MRSA). MRSA is predictably resistant to beta-lactam antibiotics (except ceftaroline). Preferred therapy is vancomycin unless clinically contraindicated. Patient requires contact precautions if  hospitalized. CRITICAL RESULT CALLED TO, READ BACK BY AND VERIFIED WITH: DR. [facility], AT 0715 03/07/17 BY D. VANHOOK    Methicillin resistance DETECTED (A) NOT DETECTED Final    Comment: CRITICAL RESULT CALLED TO, READ BACK BY AND VERIFIED WITH: DR. [facility], AT 0715 03/07/17 BY D. VANHOOK    Streptococcus species NOT DETECTED NOT DETECTED Final   Streptococcus agalactiae NOT DETECTED NOT DETECTED Final   Streptococcus pneumoniae NOT DETECTED NOT DETECTED Final   Streptococcus pyogenes NOT DETECTED NOT DETECTED Final   Acinetobacter baumannii NOT DETECTED NOT DETECTED Final   Enterobacteriaceae species  NOT DETECTED NOT DETECTED Final   Enterobacter cloacae complex NOT DETECTED NOT DETECTED Final   Escherichia coli NOT DETECTED NOT DETECTED Final   Klebsiella oxytoca NOT DETECTED NOT DETECTED Final   Klebsiella pneumoniae NOT DETECTED NOT DETECTED Final   Proteus species NOT DETECTED NOT DETECTED Final   Serratia marcescens NOT DETECTED NOT DETECTED Final   Haemophilus influenzae NOT DETECTED NOT DETECTED Final   Neisseria meningitidis NOT DETECTED NOT DETECTED Final   Pseudomonas aeruginosa NOT DETECTED NOT DETECTED Final   Candida albicans NOT DETECTED NOT DETECTED Final   Candida glabrata NOT DETECTED NOT DETECTED Final   Candida krusei NOT DETECTED NOT DETECTED Final   Candida parapsilosis NOT DETECTED NOT DETECTED Final   Candida tropicalis NOT DETECTED NOT DETECTED Final    Comment: Performed at [facility], [place]. [address], [place], [place] [address]  Culture, blood (routine x 2)     Status: None (Preliminary result)   Collection Time: 03/06/17  2:51 PM  Result Value Ref Range Status   Specimen Description BLOOD LEFT ARM  Final   Special Requests IN PEDIATRIC BOTTLE Blood Culture adequate volume  Final   Culture   Final    NO GROWTH < 24 HOURS Performed at [facility], [place] [address] [place], [place] [address]    Report Status PENDING  Incomplete  Culture, blood (Routine X 2) w Reflex to ID Panel     Status: None (Preliminary result)   Collection Time: 03/07/17 12:15 PM  Result Value Ref Range Status   Specimen Description BLOOD RIGHT ANTECUBITAL  Final   Special Requests IN PEDIATRIC BOTTLE Blood Culture adequate volume  Final   Culture  Setup Time   Final    GRAM POSITIVE COCCI IN CLUSTERS IN PEDIATRIC BOTTLE CRITICAL RESULT CALLED TO, READ BACK BY AND VERIFIED WITH: Guadlupe Spanish PHARMD, AT [place] 03/08/17 BY Rush Landmark Performed at [facility], [place] [address] [place],  [address]    Culture GRAM POSITIVE COCCI  Final   Report Status PENDING   Incomplete  MRSA PCR Screening     Status: None   Collection Time: 03/08/17 10:30 AM  Result Value Ref Range Status   MRSA by PCR NEGATIVE NEGATIVE Final    Comment:        The GeneXpert MRSA Assay (FDA approved for NASAL specimens only), is one component of a comprehensive MRSA colonization surveillance program. It is not intended to diagnose MRSA infection nor to guide or monitor treatment for MRSA infections.     Radiology Reports Dg Chest 2 View  Result Date: 03/05/2017 CLINICAL DATA:  Chest pain EXAM: CHEST  2 VIEW COMPARISON:  None. FINDINGS: Lungs are clear. Heart size and pulmonary vascularity are  normal. No adenopathy. No pneumothorax. No bone lesions. IMPRESSION: No edema or consolidation. Electronically Signed   By: Lowella Grip III M.D.   On: 03/05/2017 19:09   Dg Sternum  Result Date: 03/05/2017 CLINICAL DATA:  Pain after twisting injury.  Heroin user. EXAM: STERNUM - 1+ VIEW COMPARISON:  Chest radiograph of earlier today. FINDINGS: Single lateral view of the sternum demonstrates no focal osseous abnormality. No retrosternal soft tissue mass. No subcutaneous gas or osseous destruction. IMPRESSION: No acute osseous abnormality. Electronically Signed   By: Abigail Miyamoto M.D.   On: 03/05/2017 19:28   Ct Chest W Contrast  Result Date: 03/06/2017 CLINICAL DATA:  Patient developed pectoral pain on the right after throwing an orange. Leukocytosis. EXAM: CT CHEST WITH CONTRAST TECHNIQUE: Multidetector CT imaging of the chest was performed during intravenous contrast administration. CONTRAST:  75 cc Isovue-300 COMPARISON:  None. FINDINGS: Cardiovascular: Normal heart size without pericardial effusion. Normal ao aortic caliber. No large central pulmonary embolus though the study is not tailored for assessment of pulmonary emboli. Mediastinum/Nodes: No enlarged mediastinal, hilar, or axillary lymph nodes. Thyroid gland, trachea, and esophagus demonstrate no significant findings.  Lungs/Pleura: 4 mm left upper lobe subpleural nodule. No pneumonic consolidation, effusion or pneumothorax. Upper Abdomen: No acute abnormality. Musculoskeletal: Asymmetric enlargement with subtle intramuscular heterogeneous enhancement of the left pectoralis muscle relative to right. Findings are suspicious for pyomyositis. No intramuscular hemorrhage. There is overlying soft tissue induration and inflammation though the anterior chest wall extending to the upper abdomen. IMPRESSION: 1. Asymmetric enlargement of the left pectoralis muscle relative to right. There is subtle intramuscular enhancement suspicious for a developing pyomyositis of the left pectoralis. Overlying subcutaneous fatty induration is seen of the anterior chest wall that may reflect a cellulitis. These results were called by telephone at the time of interpretation on 03/06/2017 at 1:05 am to [place], who verbally acknowledged these results. 2. 4 mm left upper lobe subpleural nodule. No follow-up needed if patient is low-risk. Non-contrast chest CT can be considered in 12 months if patient is high-risk. This recommendation follows the consensus statement: Guidelines for Management of Incidental Pulmonary Nodules Detected on CT Images: From the Fleischner Society 2017; Radiology 2017; 284:228-243. Electronically Signed   By: Ashley Royalty M.D.   On: 03/06/2017 01:06    Time Spent in minutes  25 minutes   Joseph Nunez M.D on 03/08/2017 at 2:44 PM  Between 7am to 7pm - Pager - [phone]  After 7pm go to [email or web address] - password [facility]  [facility] -  Office  [phone]

## 2017-03-08 NOTE — Progress Notes (Signed)
Pharmacy Antibiotic Note  Joseph Nunez is a 21 y.o. male re-admitted on 03/07/2017 with bacteremia.  Pharmacy has been consulted for Vancomycin dosing. IV drug abuse: Cocaine, Heroin. ETOH negative  Left-sided pectoral swelling, erythema and pain admitted on 03/05/2017 with sepsis d/t abscess/cellulitis. Begun on azactam and vancomycin, patient left AMA 10/17, had received one dose Vancomycin in ED.   Blood Cx resulted as MRSA, patient contacted, returned to hospital 10/18 for IV abx, plan TTE, ID saw patient and stopped Zosyn; however Surgeon resuming Zosyn per pharmacy tonight, but did not explain why (patient was noted to have elevated temp earlier today)  Plan: Continue vancomycin as ordered, check levels as necessary Resume Zosyn 3.375 g IV given every 8 hrs by 4-hr infusion   Patient not allergic to PCN, erroneous allergy entry Height: 5\' 5"  (165.1 cm) Weight: 135 lb (61.2 kg) IBW/kg (Calculated) : 61.5  Temp (24hrs), Avg:99.8 F (37.7 C), Min:99 F (37.2 C), Max:100.5 F (38.1 C)   Recent Labs Lab 03/05/17 2015 03/05/17 2025 03/06/17 0515 03/07/17 1215 03/08/17 0417  WBC 15.8*  --   --  20.1* 17.8*  CREATININE 0.65  --  0.60* 0.60* 0.57*  LATICACIDVEN  --  1.45  --   --   --     Estimated Creatinine Clearance: 127.5 mL/min (A) (by C-G formula based on SCr of 0.57 mg/dL (L)).   No Known Allergies  Antimicrobials this admission: 10/18 Vanc >>  10/18 Zosyn >>  Dose adjustments this admission:  Microbiology results: 10/17 BCx: BCID 1/2 resulted Staph aureus, Methicillin resistant  Thank you for allowing pharmacy to be a part of this patient's care.  Reuel Boom, PharmD, BCPS Pager: [phone] 03/08/2017, 10:19 PM

## 2017-03-08 NOTE — Transfer of Care (Signed)
Immediate Anesthesia Transfer of Care Note  Patient: Joseph Nunez  Procedure(s) Performed: Procedure(s): IRRIGATION AND DEBRIDEMENT LEFT CHEST ABSCESS (N/A)  Patient Location: PACU  Anesthesia Type:General  Level of Consciousness:  sedated, patient cooperative and responds to stimulation  Airway & Oxygen Therapy:Patient Spontanous Breathing and Patient connected to face mask oxgen  Post-op Assessment:  Report given to PACU RN and Post -op Vital signs reviewed and stable  Post vital signs:  Reviewed and stable  Last Vitals:  Vitals:   03/08/17 1455 03/08/17 1510  BP: 137/70 (!) 142/64  Pulse: (!) 109 (!) 106  Resp:    Temp:    SpO2: 929% 24%    Complications: No apparent anesthesia complications

## 2017-03-08 NOTE — Anesthesia Postprocedure Evaluation (Signed)
Anesthesia Post Note  Patient: Joseph Nunez  Procedure(s) Performed: TRANSESOPHAGEAL ECHOCARDIOGRAM (TEE) (N/A )     Patient location during evaluation: PACU Anesthesia Type: General Level of consciousness: awake Pain management: pain level controlled Vital Signs Assessment: post-procedure vital signs reviewed and stable Respiratory status: spontaneous breathing Cardiovascular status: stable Anesthetic complications: no    Last Vitals:  Vitals:   03/08/17 1355 03/08/17 1410  BP: (!) 118/45 123/76  Pulse:  (!) 108  Resp: 20 18  Temp: 37.7 C   SpO2: 100% 95%    Last Pain:  Vitals:   03/08/17 1355  TempSrc: Oral  PainSc:                  Shakima Nisley

## 2017-03-08 NOTE — H&P (Signed)
Joseph Nunez is a 21 y.o. male who has presented today for surgery, with the diagnosis of MRSA bacteremia. The various methods of treatment have been discussed with the patient and family. After consideration of risks, benefits and other options for treatment, the patient has consented to Procedure(s): TRANSESOPHAGEAL ECHOCARDIOGRAM (TEE) (N/A) as a surgical intervention . The patient's history has been reviewed, patient examined, no change in status, stable for surgery. I have reviewed the patient's chart and labs. Questions were answered to the patient's satisfaction.   Danaka Llera C. Oval Linsey, MD, [facility]  03/08/2017 12:20 PM

## 2017-03-08 NOTE — Transfer of Care (Signed)
Immediate Anesthesia Transfer of Care Note  Patient: Joseph Nunez  Procedure(s) Performed: TRANSESOPHAGEAL ECHOCARDIOGRAM (TEE) (N/A )  Patient Location: PACU  Anesthesia Type:General  Level of Consciousness: awake and alert   Airway & Oxygen Therapy: Patient Spontanous Breathing  Post-op Assessment: Report given to RN and Post -op Vital signs reviewed and stable  Post vital signs: Reviewed and stable  Last Vitals:  Vitals:   03/08/17 0636 03/08/17 1117  BP: 132/72 125/89  Pulse: (!) 109 (!) 114  Resp: 20 (!) 26  Temp: (!) 38.1 C   SpO2: 100% 100%    Last Pain:  Vitals:   03/08/17 1117  TempSrc:   PainSc: 8       Patients Stated Pain Goal: 2 (51/10/21 1173)  Complications: No apparent anesthesia complications

## 2017-03-08 NOTE — Op Note (Addendum)
03/08/2017  7:19 PM  PATIENT:  Joseph Nunez  21 y.o. male  Patient Care Team: Patient, No Pcp Per as PCP - General (General Practice)  PRE-OPERATIVE DIAGNOSIS:  chest wall abcess  POST-OPERATIVE DIAGNOSIS:  Chest wall abcesses  PROCEDURE:   IRRIGATION AND DEBRIDEMENT LEFT & RIGHT CHEST WALL ABSCESSES  SURGEON:  Adin Hector, MD  ASSISTANT: RN   ANESTHESIA:   local and MAC (LMA)  EBL:  No intake/output data recorded.  Delay start of Pharmacological VTE agent (>24hrs) due to surgical blood loss or risk of bleeding:  no  DRAINS: none   SPECIMEN:  Source of Specimen:  Aspirate of left chest wall abscess  DISPOSITION OF SPECIMEN:  MICROBIOLOGY  COUNTS:  YES  PLAN OF CARE: Admit to inpatient   PATIENT DISPOSITION:  PACU - guarded condition.  INDICATION: Young male with polysubstance abuse with pain and swelling on his chest wall with fever and leukocytosis.  Suspicious for abscess.  CT scan showed myositis.  However more fluctuance and pain despite IV antibiotics.  Patient started to feel pain on opposite right side as well.  Recommendation made for operative incision and drainage of presumed abscesses  The anatomy and physiology of skin abscesses was discussed. Pathophysiology of SQ abscess, possible progression to fasciitis & sepsis, etc discussed . I stressed good hygiene & wound care. Possible redebridement was discussed as well.   Possibility of recurrence was discussed. Risks, benefits, alternatives were discussed. I noted a good likelihood this will help address the problem. Risks of anesthesia and other risks discussed. Questions answered. The patient is does wish to proceed.   OR FINDINGS:  Multiple subcutaneous abscesses primarily infraclavicular and parasternal.   Purulence tracts from right anterior deltoid over right pectoralis, across sternum, over left pectoralis, to left shoulder.  Total length of wound 30 cm, shoulder to shoulder.    Left chest  wall wound 7 x 6 x 4 cm deep.  It tunnels to left deltoid laterally and sternum medially.  This was the larger abscess  Right chest wall wound 4 x 2 x 4 cm deep. Tunnels to right deltoid laterally and sternum medially   DESCRIPTION:   Informed consent was confirmed. The patient received IV antibiotics. The patient underwent general anesthesia without any difficulty. The patient was positioned supine. SCDs were active during the entire case. The anterior chest axillae and neck were prepped and draped in a sterile fashion. A surgical timeout confirmed our plan.   I used an 18-gauge needle to aspirate the most fluctuant area of the left interpectoral fluctuance.  Aspirated 3 cc of yellow/green/gray purulence.  I made an incision over the most fluctuant area of the mass.   Initially came to clean subcutaneous tissues and pectoralis.  However when I probed more towards the sternum, I got a large gush of foul gray purulence.  I placed my finger into the abscess cavity to break up loculations.  I could probe with my finger towards the left deltoid region.  I could easily tunnel a long clamp across the sternum over to the right chest wall region.  I made a counter incision over the right pectoral region and encountered another pocket of pus..  I easily probed my finger to the anterior deltoid muscle on the right side.  The worst purulence seemed to be overlying both clavicles, slightly infraclavicular and most prominent left parasternal.  The periosteum of clavicles and upper sternum were exposed.    We did copious irrigation. The fascia &  periosteum were viable.  I sharply debrided out some necrotic fat with scalpel and scissors.  We took extra care to ensure hemostasis.    The wounds were packed with 2" rolled antibiotic-soaked rolled gauze x 2 rolls.  Packed laterally to the deltoids.  Medially to the sternum.  Sterile dressings applied.  Patient is being extubated go to recovery room.   We plan to  continue IV antibiotics and begin wound care training tomorrow.   Give how nasty this, I added IV Zosyn.  Continue vancomycin.  F/u on cultures.  I made an attempt to locate family to discuss patient's status and recommendations.  No one is available at this time.  We will try again later.  Consider duplex upper venous system to r/o out thrombophlebitis of one of the deep veins given the atypical abscess location and history of IV drug abuse.  Adin Hector, M.D., F.A.C.S. Gastrointestinal and Minimally Invasive Surgery [facility] [address] [place], [place] [address] [phone] Main / Paging

## 2017-03-08 NOTE — Anesthesia Preprocedure Evaluation (Signed)
Anesthesia Evaluation  Patient identified by MRN, date of birth, ID band Patient awake    Reviewed: Allergy & Precautions, NPO status , Patient's Chart, lab work & pertinent test results  Airway Mallampati: II  TM Distance: >3 FB Neck ROM: Full    Dental no notable dental hx.    Pulmonary Current Smoker,    Pulmonary exam normal breath sounds clear to auscultation       Cardiovascular negative cardio ROS Normal cardiovascular exam Rhythm:Regular Rate:Normal     Neuro/Psych negative neurological ROS  negative psych ROS   GI/Hepatic negative GI ROS, (+)     substance abuse  IV drug use,   Endo/Other  negative endocrine ROS  Renal/GU negative Renal ROS     Musculoskeletal negative musculoskeletal ROS (+)   Abdominal   Peds  Hematology negative hematology ROS (+)   Anesthesia Other Findings   Reproductive/Obstetrics                             Anesthesia Physical Anesthesia Plan  ASA: III  Anesthesia Plan: MAC   Post-op Pain Management:    Induction: Intravenous  PONV Risk Score and Plan: 0  Airway Management Planned:   Additional Equipment:   Intra-op Plan:   Post-operative Plan:   Informed Consent: I have reviewed the patients History and Physical, chart, labs and discussed the procedure including the risks, benefits and alternatives for the proposed anesthesia with the patient or authorized representative who has indicated his/her understanding and acceptance.   Dental advisory given  Plan Discussed with: CRNA  Anesthesia Plan Comments:         Anesthesia Quick Evaluation

## 2017-03-08 NOTE — CV Procedure (Signed)
Brief TEE Note  LVEF 55-60% No evidence of endocarditis No LA/LAA thrombus or mass Trace PR  For additional details see full report.   Joseph Nunez C. Joseph Linsey, MD, [facility] 03/08/2017 1:48 PM

## 2017-03-08 NOTE — Anesthesia Preprocedure Evaluation (Signed)
Anesthesia Evaluation  Patient identified by MRN, date of birth, ID band Patient awake    Reviewed: Allergy & Precautions, NPO status , Patient's Chart, lab work & pertinent test results  Airway Mallampati: II  TM Distance: >3 FB Neck ROM: Full    Dental no notable dental hx.    Pulmonary Current Smoker,    Pulmonary exam normal breath sounds clear to auscultation       Cardiovascular negative cardio ROS Normal cardiovascular exam Rhythm:Regular Rate:Normal     Neuro/Psych negative neurological ROS  negative psych ROS   GI/Hepatic negative GI ROS, (+)     substance abuse  cocaine use and IV drug use,   Endo/Other  negative endocrine ROS  Renal/GU negative Renal ROS  negative genitourinary   Musculoskeletal negative musculoskeletal ROS (+)   Abdominal   Peds negative pediatric ROS (+)  Hematology negative hematology ROS (+)   Anesthesia Other Findings   Reproductive/Obstetrics negative OB ROS                             Anesthesia Physical Anesthesia Plan  ASA: III  Anesthesia Plan: General   Post-op Pain Management:    Induction: Intravenous  PONV Risk Score and Plan: 1 and Ondansetron, Dexamethasone and Treatment may vary due to age or medical condition  Airway Management Planned: LMA  Additional Equipment:   Intra-op Plan:   Post-operative Plan:   Informed Consent: I have reviewed the patients History and Physical, chart, labs and discussed the procedure including the risks, benefits and alternatives for the proposed anesthesia with the patient or authorized representative who has indicated his/her understanding and acceptance.   Dental advisory given  Plan Discussed with: CRNA and Surgeon  Anesthesia Plan Comments:         Anesthesia Quick Evaluation

## 2017-03-08 NOTE — Anesthesia Procedure Notes (Signed)
Procedure Name: MAC Date/Time: 03/08/2017 1:05 PM Performed by: Eligha Bridegroom Pre-anesthesia Checklist: Patient identified, Emergency Drugs available, Suction available, Patient being monitored and Timeout performed Patient Re-evaluated:Patient Re-evaluated prior to induction Oxygen Delivery Method: Nasal cannula Preoxygenation: Pre-oxygenation with 100% oxygen Induction Type: IV induction

## 2017-03-08 NOTE — Progress Notes (Signed)
The anatomy and physiology of the region was discussed. The pathophysiology of subcutaneous abscess formation with progression to fasciitis & sepsis was discussed.  Need for incision, drainage, debridement discussed.  I stressed good hygiene & need for repeated wound care.  Possible redebridement / reoperation was discussed as well. Possibility of recurrence was discussed.   Risks of bleeding, infection, abscess, leak, injury to other organs, need for repair of tissues / organs, need for further treatment, heart attack, death, and other risks were discussed.  Benefits, alternatives were discussed. I noted a good likelihood this will help address the problem.  Questions answered.  The patient agrees to proceed.  I have re-reviewed the the patient's records, history, medications, and allergies.  I have re-examined the patient.  I again discussed intraoperative plans and goals of post-operative recovery.  The patient agrees to proceed.  Elbie Willy  1996/03/28 [id number]  Patient Care Team: Patient, No Pcp Per as PCP - General (General Practice)  Patient Active Problem List   Diagnosis Date Noted  . Bacteremia 03/07/2017  . Sepsis ([place]) 03/06/2017  . Pyomyositis 03/06/2017  . Cellulitis of pectoral region 03/06/2017  . Heroin abuse ([place])   . IVDU (intravenous drug user)   . Cocaine abuse ([place])   . Tobacco abuse     Past Medical History:  Diagnosis Date  . Cocaine abuse ([place])   . Heroin abuse ([place])   . IVDU (intravenous drug user)   . Tobacco abuse     History reviewed. No pertinent surgical history.  Social History   Social History  . Marital status: Single    Spouse name: N/A  . Number of children: N/A  . Years of education: N/A   Occupational History  . Not on file.   Social History Main Topics  . Smoking status: Current Every Day Smoker  . Smokeless tobacco: Never Used  . Alcohol use No  . Drug use: Yes    Types: "Crack" cocaine, Heroin     Comment: heroin   . Sexual activity: Not on file   Other Topics Concern  . Not on file   Social History Narrative  . No narrative on file    Family History  Problem Relation Age of Onset  . Heart disease Mother   . Diabetes Mellitus II Sister     Prescriptions Prior to Admission  Medication Sig Dispense Refill Last Dose  . ibuprofen (ADVIL,MOTRIN) 200 MG tablet Take 400 mg by mouth every 6 (six) hours as needed for fever, headache, mild pain, moderate pain or cramping.   03/07/2017 at Unknown time  . sulfamethoxazole-trimethoprim (BACTRIM DS,SEPTRA DS) 800-160 MG tablet Take 1 tablet by mouth 2 (two) times daily. (Patient taking differently: Take 1 tablet by mouth 2 (two) times daily. Started 10/17 for 10 days) 20 tablet 0 03/07/2017 at Unknown time    Current Facility-Administered Medications  Medication Dose Route Frequency Provider Last Rate Last Dose  . 0.9 %  sodium chloride infusion   Intravenous Continuous Elgergawy, Silver Huguenin, MD 100 mL/hr at 03/08/17 1646    . [MAR Hold] acetaminophen (TYLENOL) tablet 650 mg  650 mg Oral Q6H PRN Elgergawy, Silver Huguenin, MD   650 mg at 03/07/17 2307   Or  . [MAR Hold] acetaminophen (TYLENOL) suppository 650 mg  650 mg Rectal Q6H PRN Elgergawy, Silver Huguenin, MD      . Doug Sou Hold] enoxaparin (LOVENOX) injection 40 mg  40 mg Subcutaneous Q24H Elgergawy, Silver Huguenin, MD   40 mg at  03/07/17 2035  . [MAR Hold] hydrOXYzine (ATARAX/VISTARIL) tablet 50 mg  50 mg Oral Q6H PRN Elgergawy, Silver Huguenin, MD   50 mg at 03/07/17 2124  . [MAR Hold] ketorolac (TORADOL) 15 MG/ML injection 15 mg  15 mg Intravenous Q6H PRN Elgergawy, Silver Huguenin, MD   15 mg at 03/08/17 1646  . [MAR Hold] LORazepam (ATIVAN) tablet 1 mg  1 mg Oral Q6H PRN Elgergawy, Silver Huguenin, MD   1 mg at 03/08/17 0436  . [MAR Hold] morphine 4 MG/ML injection 4 mg  4 mg Intravenous Q4H PRN Elgergawy, Silver Huguenin, MD   4 mg at 03/08/17 1646  . [MAR Hold] nicotine (NICODERM CQ - dosed in mg/24 hours) patch 21 mg  21 mg Transdermal Daily  Schorr, Rhetta Mura, NP   21 mg at 03/08/17 0847  . [MAR Hold] oxyCODONE (Oxy IR/ROXICODONE) immediate release tablet 5 mg  5 mg Oral Q4H PRN Elgergawy, Silver Huguenin, MD   5 mg at 03/08/17 1014  . [MAR Hold] sodium chloride flush (NS) 0.9 % injection 3 mL  3 mL Intravenous Q12H Elgergawy, Silver Huguenin, MD   3 mL at 03/07/17 2036  . [MAR Hold] vancomycin (VANCOCIN) IVPB 1000 mg/200 mL premix  1,000 mg Intravenous Q8H Minda Ditto, RPH   Stopped at 03/08/17 0536     No Known Allergies  BP (!) 142/64   Pulse (!) 106   Temp 99.9 F (37.7 C) (Oral)   Resp 18   Ht 5' 5"  (1.651 m)   Wt 61.2 kg (135 lb)   SpO2 99%   BMI 22.47 kg/m   Labs: Results for orders placed or performed during the hospital encounter of 03/07/17 (from the past 48 hour(s))  CBC     Status: Abnormal   Collection Time: 03/07/17 12:15 PM  Result Value Ref Range   WBC 20.1 (H) 4.0 - 10.5 K/uL   RBC 4.18 (L) 4.22 - 5.81 MIL/uL   Hemoglobin 12.4 (L) 13.0 - 17.0 g/dL   HCT 36.1 (L) 39.0 - 52.0 %   MCV 86.4 78.0 - 100.0 fL   MCH 29.7 26.0 - 34.0 pg   MCHC 34.3 30.0 - 36.0 g/dL   RDW 12.9 11.5 - 15.5 %   Platelets 327 150 - 400 K/uL  Comprehensive metabolic panel     Status: Abnormal   Collection Time: 03/07/17 12:15 PM  Result Value Ref Range   Sodium 138 135 - 145 mmol/L   Potassium 3.8 3.5 - 5.1 mmol/L   Chloride 99 (L) 101 - 111 mmol/L   CO2 28 22 - 32 mmol/L   Glucose, Bld 121 (H) 65 - 99 mg/dL   BUN 6 6 - 20 mg/dL   Creatinine, Ser 0.60 (L) 0.61 - 1.24 mg/dL   Calcium 9.0 8.9 - 10.3 mg/dL   Total Protein 7.8 6.5 - 8.1 g/dL   Albumin 3.4 (L) 3.5 - 5.0 g/dL   AST 16 15 - 41 U/L   ALT 13 (L) 17 - 63 U/L   Alkaline Phosphatase 94 38 - 126 U/L   Total Bilirubin 0.6 0.3 - 1.2 mg/dL   GFR calc non Af Amer >60 >60 mL/min   GFR calc Af Amer >60 >60 mL/min    Comment: (NOTE) The eGFR has been calculated using the CKD EPI equation. This calculation has not been validated in all clinical situations. eGFR's persistently  <60 mL/min signify possible Chronic Kidney Disease.    Anion gap 11 5 - 15  Culture, blood (Routine X 2) w Reflex to ID Panel     Status: None (Preliminary result)   Collection Time: 03/07/17 12:15 PM  Result Value Ref Range   Specimen Description BLOOD LEFT ARM    Special Requests      BOTTLES DRAWN AEROBIC ONLY Blood Culture adequate volume   Culture      NO GROWTH 1 DAY Performed at [facility], [place] [address], [place], [place] [address]    Report Status PENDING   Culture, blood (Routine X 2) w Reflex to ID Panel     Status: None (Preliminary result)   Collection Time: 03/07/17 12:15 PM  Result Value Ref Range   Specimen Description BLOOD RIGHT ANTECUBITAL    Special Requests IN PEDIATRIC BOTTLE Blood Culture adequate volume    Culture  Setup Time      GRAM POSITIVE COCCI IN CLUSTERS IN PEDIATRIC BOTTLE CRITICAL RESULT CALLED TO, READ BACK BY AND VERIFIED WITH: Guadlupe Spanish PHARMD, AT [place] 03/08/17 BY Rush Landmark Performed at [facility], [place] [address], [place], [place] [address]    Culture GRAM POSITIVE COCCI    Report Status PENDING   Hepatitis panel, acute     Status: Abnormal   Collection Time: 03/07/17  5:41 PM  Result Value Ref Range   Hepatitis B Surface Ag Negative Negative   HCV Ab >11.0 (H) 0.0 - 0.9 s/co ratio    Comment: (NOTE)                                  Negative:     < 0.8                             Indeterminate: 0.8 - 0.9                                  Positive:     > 0.9 The CDC recommends that a positive HCV antibody result be followed up with a HCV Nucleic Acid Amplification test (536144). Performed At: [facility] [place], [place] [id number] Lindon Romp MD YP:[id number]    Hep A IgM Negative Negative   Hep B C IgM Negative Negative  RPR     Status: None   Collection Time: 03/07/17  5:41 PM  Result Value Ref Range   RPR Ser Ql Non Reactive Non Reactive    Comment: (NOTE) Performed At: [facility] [place], [place] [id number] Lindon Romp MD AS:[id number]   Basic metabolic panel     Status: Abnormal   Collection Time: 03/08/17  4:17 AM  Result Value Ref Range   Sodium 135 135 - 145 mmol/L   Potassium 4.7 3.5 - 5.1 mmol/L    Comment: DELTA CHECK NOTED NO VISIBLE HEMOLYSIS    Chloride 99 (L) 101 - 111 mmol/L   CO2 26 22 - 32 mmol/L   Glucose, Bld 130 (H) 65 - 99 mg/dL   BUN 6 6 - 20 mg/dL   Creatinine, Ser 0.57 (L) 0.61 - 1.24 mg/dL   Calcium 8.5 (L) 8.9 - 10.3 mg/dL   GFR calc non Af Amer >60 >60 mL/min   GFR calc Af Amer >60 >60 mL/min    Comment: (NOTE) The eGFR has been calculated  using the CKD EPI equation. This calculation has not been validated in all clinical situations. eGFR's persistently <60 mL/min signify possible Chronic Kidney Disease.    Anion gap 10 5 - 15  CBC     Status: Abnormal   Collection Time: 03/08/17  4:17 AM  Result Value Ref Range   WBC 17.8 (H) 4.0 - 10.5 K/uL   RBC 4.21 (L) 4.22 - 5.81 MIL/uL   Hemoglobin 12.0 (L) 13.0 - 17.0 g/dL   HCT 36.4 (L) 39.0 - 52.0 %   MCV 86.5 78.0 - 100.0 fL   MCH 28.5 26.0 - 34.0 pg   MCHC 33.0 30.0 - 36.0 g/dL   RDW 13.2 11.5 - 15.5 %   Platelets 287 150 - 400 K/uL  MRSA PCR Screening     Status: None   Collection Time: 03/08/17 10:30 AM  Result Value Ref Range   MRSA by PCR NEGATIVE NEGATIVE    Comment:        The GeneXpert MRSA Assay (FDA approved for NASAL specimens only), is one component of a comprehensive MRSA colonization surveillance program. It is not intended to diagnose MRSA infection nor to guide or monitor treatment for MRSA infections.     Imaging / Studies: Dg Chest 2 View  Result Date: 03/05/2017 CLINICAL DATA:  Chest pain EXAM: CHEST  2 VIEW COMPARISON:  None. FINDINGS: Lungs are clear. Heart size and pulmonary vascularity are normal. No adenopathy. No pneumothorax. No bone lesions. IMPRESSION: No edema or consolidation. Electronically Signed   By:  Lowella Grip III M.D.   On: 03/05/2017 19:09   Dg Sternum  Result Date: 03/05/2017 CLINICAL DATA:  Pain after twisting injury.  Heroin user. EXAM: STERNUM - 1+ VIEW COMPARISON:  Chest radiograph of earlier today. FINDINGS: Single lateral view of the sternum demonstrates no focal osseous abnormality. No retrosternal soft tissue mass. No subcutaneous gas or osseous destruction. IMPRESSION: No acute osseous abnormality. Electronically Signed   By: Abigail Miyamoto M.D.   On: 03/05/2017 19:28   Ct Chest W Contrast  Result Date: 03/06/2017 CLINICAL DATA:  Patient developed pectoral pain on the right after throwing an orange. Leukocytosis. EXAM: CT CHEST WITH CONTRAST TECHNIQUE: Multidetector CT imaging of the chest was performed during intravenous contrast administration. CONTRAST:  75 cc Isovue-300 COMPARISON:  None. FINDINGS: Cardiovascular: Normal heart size without pericardial effusion. Normal ao aortic caliber. No large central pulmonary embolus though the study is not tailored for assessment of pulmonary emboli. Mediastinum/Nodes: No enlarged mediastinal, hilar, or axillary lymph nodes. Thyroid gland, trachea, and esophagus demonstrate no significant findings. Lungs/Pleura: 4 mm left upper lobe subpleural nodule. No pneumonic consolidation, effusion or pneumothorax. Upper Abdomen: No acute abnormality. Musculoskeletal: Asymmetric enlargement with subtle intramuscular heterogeneous enhancement of the left pectoralis muscle relative to right. Findings are suspicious for pyomyositis. No intramuscular hemorrhage. There is overlying soft tissue induration and inflammation though the anterior chest wall extending to the upper abdomen. IMPRESSION: 1. Asymmetric enlargement of the left pectoralis muscle relative to right. There is subtle intramuscular enhancement suspicious for a developing pyomyositis of the left pectoralis. Overlying subcutaneous fatty induration is seen of the anterior chest wall that may  reflect a cellulitis. These results were called by telephone at the time of interpretation on 03/06/2017 at 1:05 am to [place], who verbally acknowledged these results. 2. 4 mm left upper lobe subpleural nodule. No follow-up needed if patient is low-risk. Non-contrast chest CT can be considered in 12 months if patient is high-risk.  This recommendation follows the consensus statement: Guidelines for Management of Incidental Pulmonary Nodules Detected on CT Images: From the Fleischner Society 2017; Radiology 2017; 284:228-243. Electronically Signed   By: Ashley Royalty M.D.   On: 03/06/2017 01:06     .Adin Hector, M.D., F.A.C.S. Gastrointestinal and Minimally Invasive Surgery [facility] [address], [place], [place] [address] [phone] Main / Paging  03/08/2017 5:56 PM

## 2017-03-08 NOTE — Progress Notes (Signed)
    [facility] has been requested to perform a transesophageal echocardiogram on 10/19 for bacteremia.  After careful review of history and examination, the risks and benefits of transesophageal echocardiogram have been explained including risks of esophageal damage, perforation (1:10,000 risk), bleeding, pharyngeal hematoma as well as other potential complications associated with conscious sedation including aspiration, arrhythmia, respiratory failure and death. Alternatives to treatment were discussed, questions were answered. Patient is willing to proceed.   Will need to be done with anesthesia, endo aware.  Rosaria Ferries, PA-C 03/08/2017 10:20 AM
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
## 2017-03-08 NOTE — Anesthesia Procedure Notes (Signed)
Procedure Name: LMA Insertion Date/Time: 03/08/2017 6:30 PM Performed by: Montel Clock Pre-anesthesia Checklist: Patient identified, Emergency Drugs available, Suction available, Patient being monitored and Timeout performed Patient Re-evaluated:Patient Re-evaluated prior to induction Oxygen Delivery Method: Circle system utilized Preoxygenation: Pre-oxygenation with 100% oxygen Induction Type: IV induction Ventilation: Mask ventilation without difficulty LMA: LMA with gastric port inserted LMA Size: 4.0 Number of attempts: 1 Dental Injury: Teeth and Oropharynx as per pre-operative assessment

## 2017-03-08 NOTE — Anesthesia Postprocedure Evaluation (Signed)
Anesthesia Post Note  Patient: Nutritional therapist  Procedure(s) Performed: IRRIGATION AND DEBRIDEMENT LEFT CHEST ABSCESS (N/A Chest)     Patient location during evaluation: PACU Anesthesia Type: General Level of consciousness: awake and alert and oriented Pain management: pain level controlled Vital Signs Assessment: post-procedure vital signs reviewed and stable Respiratory status: spontaneous breathing, nonlabored ventilation and respiratory function stable Cardiovascular status: blood pressure returned to baseline and stable Postop Assessment: no apparent nausea or vomiting Anesthetic complications: no    Last Vitals:  Vitals:   03/08/17 1510 03/08/17 1930  BP: (!) 142/64 127/74  Pulse: (!) 106 (!) 118  Resp:  (!) 22  Temp:  37.7 C  SpO2: 99% 99%    Last Pain:  Vitals:   03/08/17 1716  TempSrc:   PainSc: 0-No pain                 Babetta Paterson A.

## 2017-03-09 ENCOUNTER — Encounter (HOSPITAL_COMMUNITY): Payer: Self-pay | Admitting: Surgery

## 2017-03-09 ENCOUNTER — Inpatient Hospital Stay (HOSPITAL_COMMUNITY): Payer: Medicaid Other

## 2017-03-09 DIAGNOSIS — Z720 Tobacco use: Secondary | ICD-10-CM

## 2017-03-09 DIAGNOSIS — R7881 Bacteremia: Secondary | ICD-10-CM

## 2017-03-09 DIAGNOSIS — F1410 Cocaine abuse, uncomplicated: Secondary | ICD-10-CM

## 2017-03-09 DIAGNOSIS — L02213 Cutaneous abscess of chest wall: Secondary | ICD-10-CM

## 2017-03-09 DIAGNOSIS — L03313 Cellulitis of chest wall: Secondary | ICD-10-CM

## 2017-03-09 DIAGNOSIS — I8290 Acute embolism and thrombosis of unspecified vein: Secondary | ICD-10-CM

## 2017-03-09 DIAGNOSIS — M60009 Infective myositis, unspecified site: Secondary | ICD-10-CM

## 2017-03-09 LAB — CBC
HEMATOCRIT: 33.1 % — AB (ref 39.0–52.0)
Hemoglobin: 11.7 g/dL — ABNORMAL LOW (ref 13.0–17.0)
MCH: 29.6 pg (ref 26.0–34.0)
MCHC: 35.3 g/dL (ref 30.0–36.0)
MCV: 83.8 fL (ref 78.0–100.0)
Platelets: 339 10*3/uL (ref 150–400)
RBC: 3.95 MIL/uL — AB (ref 4.22–5.81)
RDW: 13.3 % (ref 11.5–15.5)
WBC: 16.1 10*3/uL — AB (ref 4.0–10.5)

## 2017-03-09 LAB — BASIC METABOLIC PANEL
ANION GAP: 11 (ref 5–15)
BUN: 7 mg/dL (ref 6–20)
CHLORIDE: 103 mmol/L (ref 101–111)
CO2: 22 mmol/L (ref 22–32)
Calcium: 8 mg/dL — ABNORMAL LOW (ref 8.9–10.3)
Creatinine, Ser: 0.56 mg/dL — ABNORMAL LOW (ref 0.61–1.24)
GFR calc Af Amer: >60 mL/min (ref >60)
GFR calc non Af Amer: >60 mL/min (ref >60)
GLUCOSE: 137 mg/dL — AB (ref 65–99)
POTASSIUM: 4.4 mmol/L (ref 3.5–5.1)
Sodium: 136 mmol/L (ref 135–145)

## 2017-03-09 LAB — CULTURE, BLOOD (ROUTINE X 2): Special Requests: ADEQUATE

## 2017-03-09 MED ORDER — PANTOPRAZOLE SODIUM 40 MG PO TBEC
40.0000 mg | DELAYED_RELEASE_TABLET | Freq: Every day | ORAL | Status: DC
  Administered 2017-03-10 – 2017-03-12 (×3): 40 mg via ORAL
  Filled 2017-03-09 (×3): qty 1

## 2017-03-09 NOTE — Progress Notes (Signed)
PROGRESS NOTE                                                                                                                                                                                                             Patient Demographics:    Joseph Nunez, is a 21 y.o. male, DOB - 1995/06/06, OEH:[id number]  Admit date - 03/07/2017   Admitting Physician Albertine Patricia, MD  Outpatient Primary MD for the patient is Patient, No Pcp Per  LOS - 2   No chief complaint on file.      Brief Narrative    21 y.o. male, ith past medical history of polysubstance abuse, he reports heroin, tobacco, cocaine, IV drugs, patient was admitted 03/06/2017, for upper chest wall cellulitis with Pyomyositis, was started empirically on IV vancomycin and Azactam, he was smoking in his room, asking only for IV pain medication, he did sign AMA the same day, his blood cultures came back positive for MRSA bacteremia , I have called patient to come back for direct admission for treatment of his bacteremia,TEE 03/08/2017 with no evidence of endocarditis, surgery consulted regarding left chest wall abscess with plan for surgical intervention.   Subjective:    Joseph Nunez afebrile, still with some CP, but reported improvement in pain. No nausea, no vomiting, no abd pain. Reports having BM's and will like to have diet advance.    Assessment  & Plan :    Principal Problem:   Abscess of parasternal chest wall s/p I&D 03/08/2017 Active Problems:   IVDU (intravenous drug user)   Pyomyositis   Cellulitis of pectoral region   Cocaine abuse ([place])   Tobacco abuse   Bacteremia   Abscess of right chest wall s/p I&D 03/08/2017   Sepsis due to MRSA bacteremia/left pectoralis muscle cellulitis, Pyomyositis and abscess - patient with fever, leukocytosis and tachycardia on admission (meeting sepsis criteria). -sepsis features resolving now -will stop zosyn as per ID  rec's -patient s/p I&D ( see below) -continue supportive care, PRN analgesics and IV vancomycin  -TEE neg for endocarditis  Left Upper chest wall cellulitis /abscess - Likely due to seeding from bacteremia, continue with IV antibiotics. -patient s/p extensive I&D on 10/19 -ID on board, will follow rec's -continue IV antibiotics, now using only Vanc -repeat blood cultures -continue PRN analgesics  Polysubstance abuse/IV drug  abuse -cessation counseling provided  -patient was receptive and will consider stopping -no active signs of withdrawal appreciated -will continue ativan and atarax PRN  Tobacco abuse -cessation counseling provided -continue nicotine patch while inpatient  59mm left upper lobe subpleural nodule: -Patient has this incidental finding on CT of the chest -will need non contrast CT chest in 12 months.    Code Status : Full  Family Communication  : no family at bedside   Disposition Plan  : Home when stable  Consults  : surgery, ID  Procedures  : TEE 03/08/2017: no vegetations seen.  DVT Prophylaxis  :  Lovenox  Lab Results  Component Value Date   PLT 339 03/09/2017    Antibiotics  :    Anti-infectives    Start     Dose/Rate Route Frequency Ordered Stop   03/08/17 2230  piperacillin-tazobactam (ZOSYN) IVPB 3.375 g  Status:  Discontinued     3.375 g 12.5 mL/hr over 240 Minutes Intravenous Every 8 hours 03/08/17 2215 03/09/17 1633   03/08/17 1845  ceFAZolin (ANCEF) IVPB 2g/100 mL premix  Status:  Discontinued     2 g 200 mL/hr over 30 Minutes Intravenous  Once 03/08/17 1836 03/08/17 2029   03/08/17 1838  ceFAZolin (ANCEF) 2-4 GM/100ML-% IVPB    Comments:  Mentley, Pamela   : cabinet override      03/08/17 1838 03/08/17 1845   03/07/17 2000  vancomycin (VANCOCIN) IVPB 1000 mg/200 mL premix     1,000 mg 200 mL/hr over 60 Minutes Intravenous Every 8 hours 03/07/17 1346     03/07/17 1800  vancomycin (VANCOCIN) IVPB 1000 mg/200 mL premix  Status:   Discontinued     1,000 mg 200 mL/hr over 60 Minutes Intravenous Every 8 hours 03/07/17 1047 03/07/17 1346   03/07/17 1100  vancomycin (VANCOCIN) 1,500 mg in sodium chloride 0.9 % 500 mL IVPB     1,500 mg 250 mL/hr over 120 Minutes Intravenous  Once 03/07/17 1044 03/07/17 1537        Objective:   Vitals:   03/08/17 2025 03/08/17 2142 03/09/17 0459 03/09/17 1411  BP: 124/86 128/82 132/76 116/69  Pulse: (!) 109 (!) 103 (!) 101 90  Resp: (!) 24 20 18 16   Temp: 99.9 F (37.7 C) 99 F (37.2 C) 98.9 F (37.2 C) 98.3 F (36.8 C)  TempSrc:  Oral Oral Oral  SpO2: 99% 100% 100% 100%  Weight:      Height:        Wt Readings from Last 3 Encounters:  03/08/17 61.2 kg (135 lb)  03/06/17 65.8 kg (145 lb)    Intake/Output Summary (Last 24 hours) at 03/09/17 2148 Last data filed at 03/09/17 2107  Gross per 24 hour  Intake             4616 ml  Output             1300 ml  Net             3316 ml    Physical Exam GEN: no fever, complaining of pain in his chest. No nausea, no vomiting and reported having BM's. Patient denies SOB. Cardiovascular: S1 and S2, no rubs, no gallops, no JVD Resp: good air movement bilaterally, no wheezing, no crackles Abd: soft, NT, ND, positive BS Extremities: no edema, no cyanosis Skin: positive clean dressings in his chest, tenderness with movement and minimal palpation (even he expressed improvement).    Data Review:    CBC  Recent Labs Lab 03/05/17 2015 03/07/17 1215 03/08/17 0417 03/09/17 0940  WBC 15.8* 20.1* 17.8* 16.1*  HGB 12.9* 12.4* 12.0* 11.7*  HCT 38.1* 36.1* 36.4* 33.1*  PLT 237 327 287 339  MCV 86.4 86.4 86.5 83.8  MCH 29.3 29.7 28.5 29.6  MCHC 33.9 34.3 33.0 35.3  RDW 12.7 12.9 13.2 13.3  LYMPHSABS 1.4  --   --   --   MONOABS 1.2*  --   --   --   EOSABS 0.1  --   --   --   BASOSABS 0.0  --   --   --     Chemistries   Recent Labs Lab 03/05/17 2015 03/06/17 0515 03/07/17 1215 03/08/17 0417 03/09/17 0940  NA 133*  135 138 135 136  K 4.2 4.3 3.8 4.7 4.4  CL 96* 102 99* 99* 103  CO2 24 24 28 26 22   GLUCOSE 154* 125* 121* 130* 137*  BUN 6 <5* 6 6 7   CREATININE 0.65 0.60* 0.60* 0.57* 0.56*  CALCIUM 8.6* 8.3* 9.0 8.5* 8.0*  AST 13*  --  16  --   --   ALT 12*  --  13*  --   --   ALKPHOS 85  --  94  --   --   BILITOT 0.5  --  0.6  --   --    Inpatient Medications  Scheduled Meds: . acetaminophen  1,000 mg Oral TID  . enoxaparin (LOVENOX) injection  40 mg Subcutaneous Q24H  . gabapentin  300 mg Oral QHS  . ibuprofen  800 mg Oral QID  . lip balm  1 application Topical BID  . nicotine  21 mg Transdermal Daily  . polyethylene glycol  17 g Oral Daily  . sodium chloride flush  3 mL Intravenous Q12H   Continuous Infusions: . sodium chloride 100 mL/hr at 03/08/17 2050  . chlorproMAZINE (THORAZINE) IV    . lactated ringers    . methocarbamol (ROBAXIN)  IV    . vancomycin Stopped (03/09/17 2042)   PRN Meds:.acetaminophen **OR** acetaminophen, alum & mag hydroxide-simeth, bisacodyl, chlorproMAZINE (THORAZINE) IV, diphenhydrAMINE, guaiFENesin-dextromethorphan, hydrocortisone, hydrocortisone cream, hydrOXYzine, lactated ringers, LORazepam, magic mouthwash, menthol-cetylpyridinium, methocarbamol (ROBAXIN)  IV, methocarbamol, metoCLOPramide (REGLAN) injection, morphine injection, oxyCODONE, phenol, polyethylene glycol, prochlorperazine  Micro Results Recent Results (from the past 240 hour(s))  Culture, blood (routine x 2)     Status: Abnormal   Collection Time: 03/06/17  1:55 AM  Result Value Ref Range Status   Specimen Description BLOOD LEFT ANTECUBITAL  Final   Special Requests   Final    BOTTLES DRAWN AEROBIC AND ANAEROBIC Blood Culture adequate volume   Culture  Setup Time   Final    GRAM POSITIVE COCCI IN CLUSTERS IN BOTH AEROBIC AND ANAEROBIC BOTTLES CRITICAL RESULT CALLED TO, READ BACK BY AND VERIFIED WITH: DR. Waldron [facility], AT 0715 03/07/17 BY Rush Landmark Performed at [facility], [address] [place], [place] [address]    Culture METHICILLIN RESISTANT STAPHYLOCOCCUS AUREUS (A)  Final   Report Status 03/08/2017 FINAL  Final   Organism ID, Bacteria METHICILLIN RESISTANT STAPHYLOCOCCUS AUREUS  Final      Susceptibility   Methicillin resistant staphylococcus aureus - MIC*    CIPROFLOXACIN <=0.5 SENSITIVE Sensitive     ERYTHROMYCIN >=8 RESISTANT Resistant     GENTAMICIN <=0.5 SENSITIVE Sensitive     OXACILLIN >=4 RESISTANT Resistant     TETRACYCLINE <=1 SENSITIVE Sensitive     VANCOMYCIN <=0.5 SENSITIVE  Sensitive     TRIMETH/SULFA <=10 SENSITIVE Sensitive     CLINDAMYCIN <=0.25 SENSITIVE Sensitive     RIFAMPIN <=0.5 SENSITIVE Sensitive     Inducible Clindamycin NEGATIVE Sensitive     * METHICILLIN RESISTANT STAPHYLOCOCCUS AUREUS  Blood Culture ID Panel (Reflexed)     Status: Abnormal   Collection Time: 03/06/17  1:55 AM  Result Value Ref Range Status   Enterococcus species NOT DETECTED NOT DETECTED Final   Listeria monocytogenes NOT DETECTED NOT DETECTED Final   Staphylococcus species DETECTED (A) NOT DETECTED Final    Comment: CRITICAL RESULT CALLED TO, READ BACK BY AND VERIFIED WITH: DR. [facility], AT 0715 03/07/17 BY D. VANHOOK    Staphylococcus aureus DETECTED (A) NOT DETECTED Final    Comment: Methicillin (oxacillin)-resistant Staphylococcus aureus (MRSA). MRSA is predictably resistant to beta-lactam antibiotics (except ceftaroline). Preferred therapy is vancomycin unless clinically contraindicated. Patient requires contact precautions if  hospitalized. CRITICAL RESULT CALLED TO, READ BACK BY AND VERIFIED WITH: DR. [facility], AT 0715 03/07/17 BY D. VANHOOK    Methicillin resistance DETECTED (A) NOT DETECTED Final    Comment: CRITICAL RESULT CALLED TO, READ BACK BY AND VERIFIED WITH: DR. [facility], AT 0715 03/07/17 BY D. VANHOOK    Streptococcus species NOT DETECTED NOT DETECTED Final   Streptococcus agalactiae NOT DETECTED NOT DETECTED Final   Streptococcus  pneumoniae NOT DETECTED NOT DETECTED Final   Streptococcus pyogenes NOT DETECTED NOT DETECTED Final   Acinetobacter baumannii NOT DETECTED NOT DETECTED Final   Enterobacteriaceae species NOT DETECTED NOT DETECTED Final   Enterobacter cloacae complex NOT DETECTED NOT DETECTED Final   Escherichia coli NOT DETECTED NOT DETECTED Final   Klebsiella oxytoca NOT DETECTED NOT DETECTED Final   Klebsiella pneumoniae NOT DETECTED NOT DETECTED Final   Proteus species NOT DETECTED NOT DETECTED Final   Serratia marcescens NOT DETECTED NOT DETECTED Final   Haemophilus influenzae NOT DETECTED NOT DETECTED Final   Neisseria meningitidis NOT DETECTED NOT DETECTED Final   Pseudomonas aeruginosa NOT DETECTED NOT DETECTED Final   Candida albicans NOT DETECTED NOT DETECTED Final   Candida glabrata NOT DETECTED NOT DETECTED Final   Candida krusei NOT DETECTED NOT DETECTED Final   Candida parapsilosis NOT DETECTED NOT DETECTED Final   Candida tropicalis NOT DETECTED NOT DETECTED Final    Comment: Performed at [facility], [place]. [address] [place], [place] [address]  Culture, blood (routine x 2)     Status: None (Preliminary result)   Collection Time: 03/06/17  2:51 PM  Result Value Ref Range Status   Specimen Description BLOOD LEFT ARM  Final   Special Requests IN PEDIATRIC BOTTLE Blood Culture adequate volume  Final   Culture   Final    NO GROWTH 3 DAYS Performed at [facility], [place] [address], [place], [place] [address]    Report Status PENDING  Incomplete  Culture, blood (Routine X 2) w Reflex to ID Panel     Status: None (Preliminary result)   Collection Time: 03/07/17 12:15 PM  Result Value Ref Range Status   Specimen Description BLOOD LEFT ARM  Final   Special Requests   Final    BOTTLES DRAWN AEROBIC ONLY Blood Culture adequate volume   Culture   Final    NO GROWTH 2 DAYS Performed at [facility], [place] [address] [place], [place] [address]    Report Status PENDING   Incomplete  Culture, blood (Routine X 2) w Reflex to ID Panel  Status: Abnormal   Collection Time: 03/07/17 12:15 PM  Result Value Ref Range Status   Specimen Description BLOOD RIGHT ANTECUBITAL  Final   Special Requests IN PEDIATRIC BOTTLE Blood Culture adequate volume  Final   Culture  Setup Time   Final    GRAM POSITIVE COCCI IN CLUSTERS IN PEDIATRIC BOTTLE CRITICAL RESULT CALLED TO, READ BACK BY AND VERIFIED WITH: N. Duncan PHARMD, AT [place] 03/08/17 BY D. VANHOOK    Culture (A)  Final    STAPHYLOCOCCUS AUREUS SUSCEPTIBILITIES PERFORMED ON PREVIOUS CULTURE WITHIN THE LAST 5 DAYS. Performed at [facility], [place] [address], [place], [place] [address]    Report Status 03/09/2017 FINAL  Final  MRSA PCR Screening     Status: None   Collection Time: 03/08/17 10:30 AM  Result Value Ref Range Status   MRSA by PCR NEGATIVE NEGATIVE Final    Comment:        The GeneXpert MRSA Assay (FDA approved for NASAL specimens only), is one component of a comprehensive MRSA colonization surveillance program. It is not intended to diagnose MRSA infection nor to guide or monitor treatment for MRSA infections.   Aerobic Culture (superficial specimen)     Status: None (Preliminary result)   Collection Time: 03/08/17  6:46 PM  Result Value Ref Range Status   Specimen Description ABSCESS LEFT PECTORAL  Final   Special Requests NONE  Final   Gram Stain   Final    ABUNDANT WBC PRESENT, PREDOMINANTLY PMN ABUNDANT GRAM POSITIVE COCCI IN CLUSTERS    Culture   Final    TOO YOUNG TO READ Performed at [facility], [place] [address], [place], [place] [address]    Report Status PENDING  Incomplete    Radiology Reports Dg Chest 2 View  Result Date: 03/05/2017 CLINICAL DATA:  Chest pain EXAM: CHEST  2 VIEW COMPARISON:  None. FINDINGS: Lungs are clear. Heart size and pulmonary vascularity are normal. No adenopathy. No pneumothorax. No bone lesions. IMPRESSION: No edema or consolidation.  Electronically Signed   By: Lowella Grip III M.D.   On: 03/05/2017 19:09   Dg Sternum  Result Date: 03/05/2017 CLINICAL DATA:  Pain after twisting injury.  Heroin user. EXAM: STERNUM - 1+ VIEW COMPARISON:  Chest radiograph of earlier today. FINDINGS: Single lateral view of the sternum demonstrates no focal osseous abnormality. No retrosternal soft tissue mass. No subcutaneous gas or osseous destruction. IMPRESSION: No acute osseous abnormality. Electronically Signed   By: Abigail Miyamoto M.D.   On: 03/05/2017 19:28   Ct Chest W Contrast  Result Date: 03/06/2017 CLINICAL DATA:  Patient developed pectoral pain on the right after throwing an orange. Leukocytosis. EXAM: CT CHEST WITH CONTRAST TECHNIQUE: Multidetector CT imaging of the chest was performed during intravenous contrast administration. CONTRAST:  75 cc Isovue-300 COMPARISON:  None. FINDINGS: Cardiovascular: Normal heart size without pericardial effusion. Normal ao aortic caliber. No large central pulmonary embolus though the study is not tailored for assessment of pulmonary emboli. Mediastinum/Nodes: No enlarged mediastinal, hilar, or axillary lymph nodes. Thyroid gland, trachea, and esophagus demonstrate no significant findings. Lungs/Pleura: 4 mm left upper lobe subpleural nodule. No pneumonic consolidation, effusion or pneumothorax. Upper Abdomen: No acute abnormality. Musculoskeletal: Asymmetric enlargement with subtle intramuscular heterogeneous enhancement of the left pectoralis muscle relative to right. Findings are suspicious for pyomyositis. No intramuscular hemorrhage. There is overlying soft tissue induration and inflammation though the anterior chest wall extending to the upper abdomen. IMPRESSION: 1. Asymmetric enlargement of the  left pectoralis muscle relative to right. There is subtle intramuscular enhancement suspicious for a developing pyomyositis of the left pectoralis. Overlying subcutaneous fatty induration is seen of the  anterior chest wall that may reflect a cellulitis. These results were called by telephone at the time of interpretation on 03/06/2017 at 1:05 am to [place], who verbally acknowledged these results. 2. 4 mm left upper lobe subpleural nodule. No follow-up needed if patient is low-risk. Non-contrast chest CT can be considered in 12 months if patient is high-risk. This recommendation follows the consensus statement: Guidelines for Management of Incidental Pulmonary Nodules Detected on CT Images: From the Fleischner Society 2017; Radiology 2017; 284:228-243. Electronically Signed   By: Ashley Royalty M.D.   On: 03/06/2017 01:06    Time Spent in minutes  30 minutes   Barton Dubois M.D on 03/09/2017 at 9:48 PM  Between 7am to 7pm - Pager - [phone]  After 7pm go to [email or web address] - password [facility]  [facility] -  Office  [phone]

## 2017-03-09 NOTE — Progress Notes (Signed)
[facility] Office:  [phone] General Surgery Progress Note   LOS: 2 days  POD -  1 Day Post-Op  Chief Complaint: Chest pain  Assessment and Plan: 1.  IRRIGATION AND DEBRIDEMENT LEFT CHEST ABSCESS - 03/08/2017 - Gross  Upper extremity doppler prelim neg  WBC - 16,100 - 03/09/2017  Zosyn and Vancomycin  To start dressing changes - wounds are generally clean - I removed about 1/2 of packing  2.  MRSA 3.  IV drug abuse 4.  Smokes 5.  DVT prophylaxis - Lovenox   Principal Problem:   Abscess of parasternal chest wall s/p I&D 03/08/2017 Active Problems:   IVDU (intravenous drug user)   Pyomyositis   Cellulitis of pectoral region   Cocaine abuse ([place])   Tobacco abuse   Bacteremia   Abscess of right chest wall s/p I&D 03/08/2017  Subjective:  Feels much better  Objective:   Vitals:   03/08/17 2142 03/09/17 0459  BP: 128/82 132/76  Pulse: (!) 103 (!) 101  Resp: 20 18  Temp: 99 F (37.2 C) 98.9 F (37.2 C)  SpO2: 100% 100%     Intake/Output from previous day:  10/19 0701 - 10/20 0700 In: 5530 [P.O.:1200; I.V.:3930; IV Piggyback:400] Out: 2250 [Urine:2225; Blood:25]  Intake/Output this shift:  Total I/O In: 3 [I.V.:3] Out: 300 [Urine:300]   Physical Exam:   General: Younger WM who is alert and oriented.    HEENT: Normal. Pupils equal. .   Lungs: Clear   Wound: Bloody, but clean - I removed 1/2 packing   Lab Results:    Recent Labs  03/08/17 0417 03/09/17 0940  WBC 17.8* 16.1*  HGB 12.0* 11.7*  HCT 36.4* 33.1*  PLT 287 339    BMET   Recent Labs  03/08/17 0417 03/09/17 0940  NA 135 136  K 4.7 4.4  CL 99* 103  CO2 26 22  GLUCOSE 130* 137*  BUN 6 7  CREATININE 0.57* 0.56*  CALCIUM 8.5* 8.0*    PT/INR  No results for input(s): LABPROT, INR in the last 72 hours.  ABG  No results for input(s): PHART, HCO3 in the last 72 hours.  Invalid input(s): PCO2, PO2   Studies/Results:  No results  found.   Anti-infectives:   Anti-infectives    Start     Dose/Rate Route Frequency Ordered Stop   03/08/17 2230  piperacillin-tazobactam (ZOSYN) IVPB 3.375 g     3.375 g 12.5 mL/hr over 240 Minutes Intravenous Every 8 hours 03/08/17 2215     03/08/17 1845  ceFAZolin (ANCEF) IVPB 2g/100 mL premix  Status:  Discontinued     2 g 200 mL/hr over 30 Minutes Intravenous  Once 03/08/17 1836 03/08/17 2029   03/08/17 1838  ceFAZolin (ANCEF) 2-4 GM/100ML-% IVPB    Comments:  [name]   : cabinet override      03/08/17 1838 03/09/17 0644   03/07/17 2000  vancomycin (VANCOCIN) IVPB 1000 mg/200 mL premix     1,000 mg 200 mL/hr over 60 Minutes Intravenous Every 8 hours 03/07/17 1346     03/07/17 1800  vancomycin (VANCOCIN) IVPB 1000 mg/200 mL premix  Status:  Discontinued     1,000 mg 200 mL/hr over 60 Minutes Intravenous Every 8 hours 03/07/17 1047 03/07/17 1346   03/07/17 1100  vancomycin (VANCOCIN) 1,500 mg in sodium chloride 0.9 % 500 mL IVPB     1,500 mg 250 mL/hr over 120 Minutes Intravenous  Once 03/07/17 1044 03/07/17 1537  Alphonsa Overall, MD, FACS Pager: [phone] Surgery Office: [phone] 03/09/2017

## 2017-03-09 NOTE — Progress Notes (Addendum)
INFECTIOUS DISEASE PROGRESS NOTE  ID: Joseph Nunez is a 21 y.o. male with  Principal Problem:   Abscess of parasternal chest wall s/p I&D 03/08/2017 Active Problems:   IVDU (intravenous drug user)   Pyomyositis   Cellulitis of pectoral region   Cocaine abuse ([address])   Tobacco abuse   Bacteremia   Abscess of right chest wall s/p I&D 03/08/2017  Subjective: Feels much better  Abtx:  Anti-infectives    Start     Dose/Rate Route Frequency Ordered Stop   03/08/17 2230  piperacillin-tazobactam (ZOSYN) IVPB 3.375 g     3.375 g 12.5 mL/hr over 240 Minutes Intravenous Every 8 hours 03/08/17 2215     03/08/17 1845  ceFAZolin (ANCEF) IVPB 2g/100 mL premix  Status:  Discontinued     2 g 200 mL/hr over 30 Minutes Intravenous  Once 03/08/17 1836 03/08/17 2029   03/08/17 1838  ceFAZolin (ANCEF) 2-4 GM/100ML-% IVPB    Comments:  Joseph Nunez   : cabinet override      03/08/17 1838 03/09/17 0644   03/07/17 2000  vancomycin (VANCOCIN) IVPB 1000 mg/200 mL premix     1,000 mg 200 mL/hr over 60 Minutes Intravenous Every 8 hours 03/07/17 1346     03/07/17 1800  vancomycin (VANCOCIN) IVPB 1000 mg/200 mL premix  Status:  Discontinued     1,000 mg 200 mL/hr over 60 Minutes Intravenous Every 8 hours 03/07/17 1047 03/07/17 1346   03/07/17 1100  vancomycin (VANCOCIN) 1,500 mg in sodium chloride 0.9 % 500 mL IVPB     1,500 mg 250 mL/hr over 120 Minutes Intravenous  Once 03/07/17 1044 03/07/17 1537      Medications:  Scheduled: . acetaminophen  1,000 mg Oral TID  . enoxaparin (LOVENOX) injection  40 mg Subcutaneous Q24H  . gabapentin  300 mg Oral QHS  . ibuprofen  800 mg Oral QID  . lip balm  1 application Topical BID  . nicotine  21 mg Transdermal Daily  . polyethylene glycol  17 g Oral Daily  . sodium chloride flush  3 mL Intravenous Q12H    Objective: Vital signs in last 24 hours: Temp:  [98.3 F (36.8 C)-99.9 F (37.7 C)] 98.3 F (36.8 C) (10/20 1411) Pulse Rate:  [90-118]  90 (10/20 1411) Resp:  [16-26] 16 (10/20 1411) BP: (116-132)/(68-86) 116/69 (10/20 1411) SpO2:  [99 %-100 %] 100 % (10/20 1411)   General appearance: alert, cooperative and no distress Resp: clear to auscultation bilaterally Chest wall: dressed.  Cardio: regular rate and rhythm GI: normal findings: bowel sounds normal and soft, non-tender  Lab Results  Recent Labs  03/08/17 0417 03/09/17 0940  WBC 17.8* 16.1*  HGB 12.0* 11.7*  HCT 36.4* 33.1*  NA 135 136  K 4.7 4.4  CL 99* 103  CO2 26 22  BUN 6 7  CREATININE 0.57* 0.56*   Liver Panel  Recent Labs  03/07/17 1215  PROT 7.8  ALBUMIN 3.4*  AST 16  ALT 13*  ALKPHOS 94  BILITOT 0.6   Sedimentation Rate No results for input(s): ESRSEDRATE in the last 72 hours. C-Reactive Protein No results for input(s): CRP in the last 72 hours.  Microbiology: Recent Results (from the past 240 hour(s))  Culture, blood (routine x 2)     Status: Abnormal   Collection Time: 03/06/17  1:55 AM  Result Value Ref Range Status   Specimen Description BLOOD LEFT ANTECUBITAL  Final   Special Requests   Final  BOTTLES DRAWN AEROBIC AND ANAEROBIC Blood Culture adequate volume   Culture  Setup Time   Final    GRAM POSITIVE COCCI IN CLUSTERS IN BOTH AEROBIC AND ANAEROBIC BOTTLES CRITICAL RESULT CALLED TO, READ BACK BY AND VERIFIED WITH: DR. Waldron [facility], AT 0715 03/07/17 BY Rush Landmark Performed at [facility], [place] [address], [place], [place] [address]    Culture METHICILLIN RESISTANT STAPHYLOCOCCUS AUREUS (A)  Final   Report Status 03/08/2017 FINAL  Final   Organism ID, Bacteria METHICILLIN RESISTANT STAPHYLOCOCCUS AUREUS  Final      Susceptibility   Methicillin resistant staphylococcus aureus - MIC*    CIPROFLOXACIN <=0.5 SENSITIVE Sensitive     ERYTHROMYCIN >=8 RESISTANT Resistant     GENTAMICIN <=0.5 SENSITIVE Sensitive     OXACILLIN >=4 RESISTANT Resistant     TETRACYCLINE <=1 SENSITIVE Sensitive     VANCOMYCIN <=0.5  SENSITIVE Sensitive     TRIMETH/SULFA <=10 SENSITIVE Sensitive     CLINDAMYCIN <=0.25 SENSITIVE Sensitive     RIFAMPIN <=0.5 SENSITIVE Sensitive     Inducible Clindamycin NEGATIVE Sensitive     * METHICILLIN RESISTANT STAPHYLOCOCCUS AUREUS  Blood Culture ID Panel (Reflexed)     Status: Abnormal   Collection Time: 03/06/17  1:55 AM  Result Value Ref Range Status   Enterococcus species NOT DETECTED NOT DETECTED Final   Listeria monocytogenes NOT DETECTED NOT DETECTED Final   Staphylococcus species DETECTED (A) NOT DETECTED Final    Comment: CRITICAL RESULT CALLED TO, READ BACK BY AND VERIFIED WITH: DR. [facility], AT 0715 03/07/17 BY D. VANHOOK    Staphylococcus aureus DETECTED (A) NOT DETECTED Final    Comment: Methicillin (oxacillin)-resistant Staphylococcus aureus (MRSA). MRSA is predictably resistant to beta-lactam antibiotics (except ceftaroline). Preferred therapy is vancomycin unless clinically contraindicated. Patient requires contact precautions if  hospitalized. CRITICAL RESULT CALLED TO, READ BACK BY AND VERIFIED WITH: DR. [facility], AT 0715 03/07/17 BY D. VANHOOK    Methicillin resistance DETECTED (A) NOT DETECTED Final    Comment: CRITICAL RESULT CALLED TO, READ BACK BY AND VERIFIED WITH: DR. [facility], AT 0715 03/07/17 BY D. VANHOOK    Streptococcus species NOT DETECTED NOT DETECTED Final   Streptococcus agalactiae NOT DETECTED NOT DETECTED Final   Streptococcus pneumoniae NOT DETECTED NOT DETECTED Final   Streptococcus pyogenes NOT DETECTED NOT DETECTED Final   Acinetobacter baumannii NOT DETECTED NOT DETECTED Final   Enterobacteriaceae species NOT DETECTED NOT DETECTED Final   Enterobacter cloacae complex NOT DETECTED NOT DETECTED Final   Escherichia coli NOT DETECTED NOT DETECTED Final   Klebsiella oxytoca NOT DETECTED NOT DETECTED Final   Klebsiella pneumoniae NOT DETECTED NOT DETECTED Final   Proteus species NOT DETECTED NOT DETECTED Final   Serratia marcescens NOT  DETECTED NOT DETECTED Final   Haemophilus influenzae NOT DETECTED NOT DETECTED Final   Neisseria meningitidis NOT DETECTED NOT DETECTED Final   Pseudomonas aeruginosa NOT DETECTED NOT DETECTED Final   Candida albicans NOT DETECTED NOT DETECTED Final   Candida glabrata NOT DETECTED NOT DETECTED Final   Candida krusei NOT DETECTED NOT DETECTED Final   Candida parapsilosis NOT DETECTED NOT DETECTED Final   Candida tropicalis NOT DETECTED NOT DETECTED Final    Comment: Performed at [facility], [place] [address], [place], [place] [address]  Culture, blood (routine x 2)     Status: None (Preliminary result)   Collection Time: 03/06/17  2:51 PM  Result Value Ref Range Status   Specimen Description BLOOD LEFT ARM  Final  Special Requests IN PEDIATRIC BOTTLE Blood Culture adequate volume  Final   Culture   Final    NO GROWTH 3 DAYS Performed at [facility], [place] [address], [place], [place] [address]    Report Status PENDING  Incomplete  Culture, blood (Routine X 2) w Reflex to ID Panel     Status: None (Preliminary result)   Collection Time: 03/07/17 12:15 PM  Result Value Ref Range Status   Specimen Description BLOOD LEFT ARM  Final   Special Requests   Final    BOTTLES DRAWN AEROBIC ONLY Blood Culture adequate volume   Culture   Final    NO GROWTH 2 DAYS Performed at [facility], [place] [address], [place], [place] [address]    Report Status PENDING  Incomplete  Culture, blood (Routine X 2) w Reflex to ID Panel     Status: Abnormal   Collection Time: 03/07/17 12:15 PM  Result Value Ref Range Status   Specimen Description BLOOD RIGHT ANTECUBITAL  Final   Special Requests IN PEDIATRIC BOTTLE Blood Culture adequate volume  Final   Culture  Setup Time   Final    GRAM POSITIVE COCCI IN CLUSTERS IN PEDIATRIC BOTTLE CRITICAL RESULT CALLED TO, READ BACK BY AND VERIFIED WITH: N. [place] PHARMD, AT [place] 03/08/17 BY D. VANHOOK    Culture (A)  Final    STAPHYLOCOCCUS  AUREUS SUSCEPTIBILITIES PERFORMED ON PREVIOUS CULTURE WITHIN THE LAST 5 DAYS. Performed at [facility], [place] [address] [place], [place] [address]    Report Status 03/09/2017 FINAL  Final  MRSA PCR Screening     Status: None   Collection Time: 03/08/17 10:30 AM  Result Value Ref Range Status   MRSA by PCR NEGATIVE NEGATIVE Final    Comment:        The GeneXpert MRSA Assay (FDA approved for NASAL specimens only), is one component of a comprehensive MRSA colonization surveillance program. It is not intended to diagnose MRSA infection nor to guide or monitor treatment for MRSA infections.   Aerobic Culture (superficial specimen)     Status: None (Preliminary result)   Collection Time: 03/08/17  6:46 PM  Result Value Ref Range Status   Specimen Description ABSCESS LEFT PECTORAL  Final   Special Requests NONE  Final   Gram Stain   Final    ABUNDANT WBC PRESENT, PREDOMINANTLY PMN ABUNDANT GRAM POSITIVE COCCI IN CLUSTERS    Culture   Final    TOO YOUNG TO READ Performed at [facility], [place] [address] [place], [place] [address]    Report Status PENDING  Incomplete    Studies/Results: No results found.   Assessment/Plan: Chest wall abscess MRSA bacteremia 2/4 bottles IVDA   Total days of antibiotics: 2 vanco/zosyn     Will repeat BCx Will stop zosyn Continue vanco alone Await Op Cx, suspect MRSA as well Await HIV, Hep C studies.       Bobby Rumpf MD, FACP Infectious Diseases (pager) [phone] [email or web address] 03/09/2017, 4:29 PM  LOS: 2 days

## 2017-03-09 NOTE — Progress Notes (Signed)
Bleeding noted from I/D dressing site. Patient reported he was trying to  Picked up something from his bedside table and noticed blood  from his dressing. Top layered dressing changed with dry 4X4 gauze and ABD pad on top. Inner dressing remain intact. Patient stable will notified Dr. Johney Maine in the AM.

## 2017-03-09 NOTE — Progress Notes (Signed)
*  PRELIMINARY RESULTS* Vascular Ultrasound Bil Upper Ext Venous Duplex limited has been completed.  Preliminary findings: No evidence of DVT of the internal jugular, subclavian, axillary, and brachial veins.    Landry Mellow, RDMS, RVT  03/09/2017, 9:56 AM

## 2017-03-10 ENCOUNTER — Encounter (HOSPITAL_COMMUNITY): Payer: Self-pay | Admitting: Cardiovascular Disease

## 2017-03-10 LAB — VANCOMYCIN, TROUGH: Vancomycin Tr: 8 ug/mL — ABNORMAL LOW (ref 15–20)

## 2017-03-10 MED ORDER — PIPERACILLIN-TAZOBACTAM 3.375 G IVPB
3.3750 g | Freq: Three times a day (TID) | INTRAVENOUS | Status: DC
  Administered 2017-03-10 – 2017-03-12 (×7): 3.375 g via INTRAVENOUS
  Filled 2017-03-10 (×7): qty 50

## 2017-03-10 MED ORDER — VANCOMYCIN HCL 10 G IV SOLR
1250.0000 mg | Freq: Three times a day (TID) | INTRAVENOUS | Status: DC
  Administered 2017-03-10 – 2017-03-13 (×8): 1250 mg via INTRAVENOUS
  Filled 2017-03-10 (×9): qty 1250

## 2017-03-10 NOTE — Progress Notes (Signed)
Pharmacy Antibiotic Note  Joseph Nunez is a 21 y.o. male admitted on 03/07/2017 with chest wall abscess s/p I&D 03/08/17, MRSA bacteremia, pyomyositis, cellulitis, IVDU.  He was narrowed to Vancomycin alone, but with new concerns for polymicrobial infection and possible need for further debridement, Pharmacy has been consulted to resume Zosyn dosing.  Plan:  Zosyn 3.375g IV Q8H infused over 4hrs.  Increase to Vancomycin 1250 mg IV q8h.  Recheck Vanc trough at steady state.  Daily SCr  Follow up renal fxn, culture results, and clinical course.  Height: 5\' 5"  (165.1 cm) Weight: 135 lb (61.2 kg) IBW/kg (Calculated) : 61.5  Temp (24hrs), Avg:98.2 F (36.8 C), Min:98 F (36.7 C), Max:98.3 F (36.8 C)   Recent Labs Lab 03/05/17 2015 03/05/17 2025 03/06/17 0515 03/07/17 1215 03/08/17 0417 03/09/17 0940  WBC 15.8*  --   --  20.1* 17.8* 16.1*  CREATININE 0.65  --  0.60* 0.60* 0.57* 0.56*  LATICACIDVEN  --  1.45  --   --   --   --     Estimated Creatinine Clearance: 127.5 mL/min (A) (by C-G formula based on SCr of 0.56 mg/dL (L)).    No Known Allergies  Antimicrobials this admission: 10/17 received Vanc 1gm x1 in ED prior to leaving [facility] 10/18 Vanc >>  10/18 Zosyn x1; 10/19 Zosyn >>10/20, resumed 10/21 >>  Dose adjustments this admission: 10/21 1130 VT = 8 on vanc 1g q8h prior to 8th dose  Microbiology results: 10/17 BCx: 1/2 MRSA (S=Vanc); only BCx drawn from R side 10/18 BCx: 1/2 SA (Sens pending); only BCx drawn from R side 10/19 Abscess (superficial, L pectoral): [facility] 10/19 MRSA PCR: negative  Thank you for allowing pharmacy to be a part of this patient's care.  Gretta Arab PharmD, BCPS Pager [phone] 03/10/2017 10:09 AM

## 2017-03-10 NOTE — Progress Notes (Signed)
Discussed with Dr Lucia Gaskins Will restart zosyn for concern of possible polymicrobial abscess Possible return to OR Will f/u in AM

## 2017-03-10 NOTE — Progress Notes (Addendum)
PROGRESS NOTE                                                                                                                                                                                                             Patient Demographics:    Joseph Nunez, is a 21 y.o. male, DOB - 05-Feb-1996, TML:[id number]  Admit date - 03/07/2017   Admitting Physician Albertine Patricia, MD  Outpatient Primary MD for the patient is Patient, No Pcp Per  LOS - 3   No chief complaint on file.      Brief Narrative    21 y.o. male, ith past medical history of polysubstance abuse, he reports heroin, tobacco, cocaine, IV drugs, patient was admitted 03/06/2017, for upper chest wall cellulitis with Pyomyositis, was started empirically on IV vancomycin and Azactam, he was smoking in his room, asking only for IV pain medication, he did sign AMA the same day, his blood cultures came back positive for MRSA bacteremia , I have called patient to come back for direct admission for treatment of his bacteremia,TEE 03/08/2017 with no evidence of endocarditis, surgery consulted regarding left chest wall abscess with plan for surgical intervention.   Subjective:    Mcgregor Kemmer no fever, no nausea, no vomiting. Increase drainage from wound appreciated. Continue to have CP.   Assessment  & Plan :    Principal Problem:   Abscesses of parasternal chest wall s/p I&D 03/08/2017 Active Problems:   IVDU (intravenous drug user)   Pyomyositis   Cellulitis of pectoral region   Cocaine abuse ([place])   Tobacco abuse   Bacteremia   Abscess of right chest wall s/p I&D 03/08/2017   Sepsis due to MRSA bacteremia/left pectoralis muscle cellulitis, Pyomyositis and abscess - patient with fever, leukocytosis and tachycardia on admission (meeting sepsis criteria). -sepsis features resolving now, will monitor -wound with increased drainage  -will restart zosyn as per ID and surgery  discussing/rec's -patient s/p I&D (see below); might required another trip to OR for further debridement. -continue supportive care, PRN analgesics and IV antibiotics -TEE neg for endocarditis  Left Upper chest wall cellulitis /abscess -Likely due to seeding from bacteremia, continue with IV antibiotics. -patient s/p extensive I&D on 10/19 -ID on board, will follow rec's -continue IV antibiotics, after discussing with surgeons there is concerns for poly bacteria and zosyn  has been restarted. Will also continue  now using only Vanc -repeat blood cultures pending  -continue PRN analgesics -might need further debridement in the OR  Polysubstance abuse/IV drug abuse -cessation counseling provided  -patient was receptive and considering to stop -no active signs of withdrawal appreciated -will continue ativan and atarax PRN -SW will be involved to provide outpatient resources to assist him quit.   Tobacco abuse -cessation counseling provided -continue nicotine patch while inpatient  64mm left upper lobe subpleural nodule: -Patient has this incidental finding on CT of the chest -will need non contrast CT chest in 12 months. -no SOB, no weight loss -had hx of smoking   Code Status : Full  Family Communication  : no family at bedside   Disposition Plan  : Home when stable  Consults  : surgery, ID  Procedures  : TEE 03/08/2017: no vegetations seen.  DVT Prophylaxis  :  Lovenox  Lab Results  Component Value Date   PLT 339 03/09/2017    Antibiotics  :    Anti-infectives    Start     Dose/Rate Route Frequency Ordered Stop   03/10/17 2000  vancomycin (VANCOCIN) 1,250 mg in sodium chloride 0.9 % 250 mL IVPB     1,250 mg 166.7 mL/hr over 90 Minutes Intravenous Every 8 hours 03/10/17 1359     03/10/17 1030  piperacillin-tazobactam (ZOSYN) IVPB 3.375 g     3.375 g 12.5 mL/hr over 240 Minutes Intravenous Every 8 hours 03/10/17 1008     03/08/17 2230  piperacillin-tazobactam  (ZOSYN) IVPB 3.375 g  Status:  Discontinued     3.375 g 12.5 mL/hr over 240 Minutes Intravenous Every 8 hours 03/08/17 2215 03/09/17 1633   03/08/17 1845  ceFAZolin (ANCEF) IVPB 2g/100 mL premix  Status:  Discontinued     2 g 200 mL/hr over 30 Minutes Intravenous  Once 03/08/17 1836 03/08/17 2029   03/08/17 1838  ceFAZolin (ANCEF) 2-4 GM/100ML-% IVPB    Comments:  Mentley, Pamela   : cabinet override      03/08/17 1838 03/08/17 1845   03/07/17 2000  vancomycin (VANCOCIN) IVPB 1000 mg/200 mL premix  Status:  Discontinued     1,000 mg 200 mL/hr over 60 Minutes Intravenous Every 8 hours 03/07/17 1346 03/10/17 1359   03/07/17 1800  vancomycin (VANCOCIN) IVPB 1000 mg/200 mL premix  Status:  Discontinued     1,000 mg 200 mL/hr over 60 Minutes Intravenous Every 8 hours 03/07/17 1047 03/07/17 1346   03/07/17 1100  vancomycin (VANCOCIN) 1,500 mg in sodium chloride 0.9 % 500 mL IVPB     1,500 mg 250 mL/hr over 120 Minutes Intravenous  Once 03/07/17 1044 03/07/17 1537        Objective:   Vitals:   03/09/17 1411 03/09/17 2239 03/10/17 0700 03/10/17 1310  BP: 116/69 125/62 122/82 124/74  Pulse: 90 95 90 95  Resp: 16 18 18 18   Temp: 98.3 F (36.8 C) 98.3 F (36.8 C) 98 F (36.7 C) 98.4 F (36.9 C)  TempSrc: Oral Oral Oral Oral  SpO2: 100% 100% 100% 98%  Weight:      Height:        Wt Readings from Last 3 Encounters:  03/08/17 61.2 kg (135 lb)  03/06/17 65.8 kg (145 lb)    Intake/Output Summary (Last 24 hours) at 03/10/17 1739 Last data filed at 03/10/17 1309  Gross per 24 hour  Intake  3833 ml  Output              450 ml  Net             3383 ml    Physical Exam GEN: afebrile, no SOB, no abd pain, no nausea, no vomiting. Continue experiencing chest pain. Cardiovascular: S1 and S2, no rubs, no gallops, no murmurs Resp: good air movement, no wheezing, no crackles Abd: soft, NT, ND, positive BS Extremities: no edema, no cyanosis, no clubbing Skin: positive chest  discomfort, clean dressings in place; per nursing staff there was increase drainage overnight.     Data Review:    CBC  Recent Labs Lab 03/05/17 2015 03/07/17 1215 03/08/17 0417 03/09/17 0940  WBC 15.8* 20.1* 17.8* 16.1*  HGB 12.9* 12.4* 12.0* 11.7*  HCT 38.1* 36.1* 36.4* 33.1*  PLT 237 327 287 339  MCV 86.4 86.4 86.5 83.8  MCH 29.3 29.7 28.5 29.6  MCHC 33.9 34.3 33.0 35.3  RDW 12.7 12.9 13.2 13.3  LYMPHSABS 1.4  --   --   --   MONOABS 1.2*  --   --   --   EOSABS 0.1  --   --   --   BASOSABS 0.0  --   --   --     Chemistries   Recent Labs Lab 03/05/17 2015 03/06/17 0515 03/07/17 1215 03/08/17 0417 03/09/17 0940 03/10/17 0417  NA 133* 135 138 135 136  --   K 4.2 4.3 3.8 4.7 4.4  --   CL 96* 102 99* 99* 103  --   CO2 24 24 28 26 22   --   GLUCOSE 154* 125* 121* 130* 137*  --   BUN 6 <5* 6 6 7   --   CREATININE 0.65 0.60* 0.60* 0.57* 0.56*  --   CALCIUM 8.6* 8.3* 9.0 8.5* 8.0*  --   AST 13*  --  16  --   --   --   ALT 12*  --  13*  --   --  BLOOD  ALKPHOS 85  --  94  --   --   --   BILITOT 0.5  --  0.6  --   --   --    Inpatient Medications  Scheduled Meds: . acetaminophen  1,000 mg Oral TID  . enoxaparin (LOVENOX) injection  40 mg Subcutaneous Q24H  . gabapentin  300 mg Oral QHS  . ibuprofen  800 mg Oral QID  . lip balm  1 application Topical BID  . nicotine  21 mg Transdermal Daily  . pantoprazole  40 mg Oral Q1200  . polyethylene glycol  17 g Oral Daily  . sodium chloride flush  3 mL Intravenous Q12H   Continuous Infusions: . sodium chloride 100 mL/hr at 03/08/17 2050  . chlorproMAZINE (THORAZINE) IV    . lactated ringers    . methocarbamol (ROBAXIN)  IV    . piperacillin-tazobactam (ZOSYN)  IV 3.375 g (03/10/17 1645)  . vancomycin     PRN Meds:.acetaminophen **OR** acetaminophen, alum & mag hydroxide-simeth, bisacodyl, chlorproMAZINE (THORAZINE) IV, diphenhydrAMINE, guaiFENesin-dextromethorphan, hydrocortisone, hydrocortisone cream, hydrOXYzine,  lactated ringers, LORazepam, magic mouthwash, menthol-cetylpyridinium, methocarbamol (ROBAXIN)  IV, methocarbamol, metoCLOPramide (REGLAN) injection, morphine injection, oxyCODONE, phenol, polyethylene glycol, prochlorperazine  Micro Results Recent Results (from the past 240 hour(s))  Culture, blood (routine x 2)     Status: Abnormal   Collection Time: 03/06/17  1:55 AM  Result Value Ref Range Status   Specimen Description BLOOD LEFT  ANTECUBITAL  Final   Special Requests   Final    BOTTLES DRAWN AEROBIC AND ANAEROBIC Blood Culture adequate volume   Culture  Setup Time   Final    GRAM POSITIVE COCCI IN CLUSTERS IN BOTH AEROBIC AND ANAEROBIC BOTTLES CRITICAL RESULT CALLED TO, READ BACK BY AND VERIFIED WITH: DR. Wald[facility], AT 0715 03/07/17 BY Rush Landmark Performed at [facility], [place] [address], [place], [place] [address]    Culture METHICILLIN RESISTANT STAPHYLOCOCCUS AUREUS (A)  Final   Report Status 03/08/2017 FINAL  Final   Organism ID, Bacteria METHICILLIN RESISTANT STAPHYLOCOCCUS AUREUS  Final      Susceptibility   Methicillin resistant staphylococcus aureus - MIC*    CIPROFLOXACIN <=0.5 SENSITIVE Sensitive     ERYTHROMYCIN >=8 RESISTANT Resistant     GENTAMICIN <=0.5 SENSITIVE Sensitive     OXACILLIN >=4 RESISTANT Resistant     TETRACYCLINE <=1 SENSITIVE Sensitive     VANCOMYCIN <=0.5 SENSITIVE Sensitive     TRIMETH/SULFA <=10 SENSITIVE Sensitive     CLINDAMYCIN <=0.25 SENSITIVE Sensitive     RIFAMPIN <=0.5 SENSITIVE Sensitive     Inducible Clindamycin NEGATIVE Sensitive     * METHICILLIN RESISTANT STAPHYLOCOCCUS AUREUS  Blood Culture ID Panel (Reflexed)     Status: Abnormal   Collection Time: 03/06/17  1:55 AM  Result Value Ref Range Status   Enterococcus species NOT DETECTED NOT DETECTED Final   Listeria monocytogenes NOT DETECTED NOT DETECTED Final   Staphylococcus species DETECTED (A) NOT DETECTED Final    Comment: CRITICAL RESULT CALLED TO, READ BACK BY AND  VERIFIED WITH: DR. [facility], AT 0715 03/07/17 BY D. VANHOOK    Staphylococcus aureus DETECTED (A) NOT DETECTED Final    Comment: Methicillin (oxacillin)-resistant Staphylococcus aureus (MRSA). MRSA is predictably resistant to beta-lactam antibiotics (except ceftaroline). Preferred therapy is vancomycin unless clinically contraindicated. Patient requires contact precautions if  hospitalized. CRITICAL RESULT CALLED TO, READ BACK BY AND VERIFIED WITH: DR. [facility], AT 0715 03/07/17 BY D. VANHOOK    Methicillin resistance DETECTED (A) NOT DETECTED Final    Comment: CRITICAL RESULT CALLED TO, READ BACK BY AND VERIFIED WITH: DR. [facility], AT 0715 03/07/17 BY D. VANHOOK    Streptococcus species NOT DETECTED NOT DETECTED Final   Streptococcus agalactiae NOT DETECTED NOT DETECTED Final   Streptococcus pneumoniae NOT DETECTED NOT DETECTED Final   Streptococcus pyogenes NOT DETECTED NOT DETECTED Final   Acinetobacter baumannii NOT DETECTED NOT DETECTED Final   Enterobacteriaceae species NOT DETECTED NOT DETECTED Final   Enterobacter cloacae complex NOT DETECTED NOT DETECTED Final   Escherichia coli NOT DETECTED NOT DETECTED Final   Klebsiella oxytoca NOT DETECTED NOT DETECTED Final   Klebsiella pneumoniae NOT DETECTED NOT DETECTED Final   Proteus species NOT DETECTED NOT DETECTED Final   Serratia marcescens NOT DETECTED NOT DETECTED Final   Haemophilus influenzae NOT DETECTED NOT DETECTED Final   Neisseria meningitidis NOT DETECTED NOT DETECTED Final   Pseudomonas aeruginosa NOT DETECTED NOT DETECTED Final   Candida albicans NOT DETECTED NOT DETECTED Final   Candida glabrata NOT DETECTED NOT DETECTED Final   Candida krusei NOT DETECTED NOT DETECTED Final   Candida parapsilosis NOT DETECTED NOT DETECTED Final   Candida tropicalis NOT DETECTED NOT DETECTED Final    Comment: Performed at [facility], [place]. [address], [place], [place] [address]  Culture, blood (routine x 2)     Status:  None (Preliminary result)   Collection Time: 03/06/17  2:51 PM  Result  Value Ref Range Status   Specimen Description BLOOD LEFT ARM  Final   Special Requests IN PEDIATRIC BOTTLE Blood Culture adequate volume  Final   Culture   Final    NO GROWTH 4 DAYS Performed at [facility], [address] [place], [place] [address]    Report Status PENDING  Incomplete  Culture, blood (Routine X 2) w Reflex to ID Panel     Status: None (Preliminary result)   Collection Time: 03/07/17 12:15 PM  Result Value Ref Range Status   Specimen Description BLOOD LEFT ARM  Final   Special Requests   Final    BOTTLES DRAWN AEROBIC ONLY Blood Culture adequate volume   Culture   Final    NO GROWTH 3 DAYS Performed at [facility], [address], [place], [place] [address]    Report Status PENDING  Incomplete  Culture, blood (Routine X 2) w Reflex to ID Panel     Status: Abnormal   Collection Time: 03/07/17 12:15 PM  Result Value Ref Range Status   Specimen Description BLOOD RIGHT ANTECUBITAL  Final   Special Requests IN PEDIATRIC BOTTLE Blood Culture adequate volume  Final   Culture  Setup Time   Final    GRAM POSITIVE COCCI IN CLUSTERS IN PEDIATRIC BOTTLE CRITICAL RESULT CALLED TO, READ BACK BY AND VERIFIED WITH: N. Gardiner PHARMD, AT [place] 03/08/17 BY D. VANHOOK    Culture (A)  Final    STAPHYLOCOCCUS AUREUS SUSCEPTIBILITIES PERFORMED ON PREVIOUS CULTURE WITHIN THE LAST 5 DAYS. Performed at [facility], [place] [address] [place], [place] [address]    Report Status 03/09/2017 FINAL  Final  MRSA PCR Screening     Status: None   Collection Time: 03/08/17 10:30 AM  Result Value Ref Range Status   MRSA by PCR NEGATIVE NEGATIVE Final    Comment:        The GeneXpert MRSA Assay (FDA approved for NASAL specimens only), is one component of a comprehensive MRSA colonization surveillance program. It is not intended to diagnose MRSA infection nor to guide or monitor treatment for MRSA  infections.   Aerobic Culture (superficial specimen)     Status: None (Preliminary result)   Collection Time: 03/08/17  6:46 PM  Result Value Ref Range Status   Specimen Description ABSCESS LEFT PECTORAL  Final   Special Requests NONE  Final   Gram Stain   Final    ABUNDANT WBC PRESENT, PREDOMINANTLY PMN ABUNDANT GRAM POSITIVE COCCI IN CLUSTERS    Culture   Final    ABUNDANT STAPHYLOCOCCUS AUREUS SUSCEPTIBILITIES TO FOLLOW Performed at [facility], [place] [address] [place], [place] [address]    Report Status PENDING  Incomplete    Radiology Reports Dg Chest 2 View  Result Date: 03/05/2017 CLINICAL DATA:  Chest pain EXAM: CHEST  2 VIEW COMPARISON:  None. FINDINGS: Lungs are clear. Heart size and pulmonary vascularity are normal. No adenopathy. No pneumothorax. No bone lesions. IMPRESSION: No edema or consolidation. Electronically Signed   By: Lowella Grip III M.D.   On: 03/05/2017 19:09   Dg Sternum  Result Date: 03/05/2017 CLINICAL DATA:  Pain after twisting injury.  Heroin user. EXAM: STERNUM - 1+ VIEW COMPARISON:  Chest radiograph of earlier today. FINDINGS: Single lateral view of the sternum demonstrates no focal osseous abnormality. No retrosternal soft tissue mass. No subcutaneous gas or osseous destruction. IMPRESSION: No acute osseous abnormality. Electronically Signed   By: Abigail Miyamoto  M.D.   On: 03/05/2017 19:28   Ct Chest W Contrast  Result Date: 03/06/2017 CLINICAL DATA:  Patient developed pectoral pain on the right after throwing an orange. Leukocytosis. EXAM: CT CHEST WITH CONTRAST TECHNIQUE: Multidetector CT imaging of the chest was performed during intravenous contrast administration. CONTRAST:  75 cc Isovue-300 COMPARISON:  None. FINDINGS: Cardiovascular: Normal heart size without pericardial effusion. Normal ao aortic caliber. No large central pulmonary embolus though the study is not tailored for assessment of pulmonary emboli. Mediastinum/Nodes: No  enlarged mediastinal, hilar, or axillary lymph nodes. Thyroid gland, trachea, and esophagus demonstrate no significant findings. Lungs/Pleura: 4 mm left upper lobe subpleural nodule. No pneumonic consolidation, effusion or pneumothorax. Upper Abdomen: No acute abnormality. Musculoskeletal: Asymmetric enlargement with subtle intramuscular heterogeneous enhancement of the left pectoralis muscle relative to right. Findings are suspicious for pyomyositis. No intramuscular hemorrhage. There is overlying soft tissue induration and inflammation though the anterior chest wall extending to the upper abdomen. IMPRESSION: 1. Asymmetric enlargement of the left pectoralis muscle relative to right. There is subtle intramuscular enhancement suspicious for a developing pyomyositis of the left pectoralis. Overlying subcutaneous fatty induration is seen of the anterior chest wall that may reflect a cellulitis. These results were called by telephone at the time of interpretation on 03/06/2017 at 1:05 am to [place], who verbally acknowledged these results. 2. 4 mm left upper lobe subpleural nodule. No follow-up needed if patient is low-risk. Non-contrast chest CT can be considered in 12 months if patient is high-risk. This recommendation follows the consensus statement: Guidelines for Management of Incidental Pulmonary Nodules Detected on CT Images: From the Fleischner Society 2017; Radiology 2017; 284:228-243. Electronically Signed   By: Ashley Royalty M.D.   On: 03/06/2017 01:06    Time Spent in minutes  25 minutes   Barton Dubois M.D on 03/10/2017 at 5:39 PM  Between 7am to 7pm - Pager - [phone]  After 7pm go to [email or web address] - password [email or web address]  [facility] -  Office  [phone]

## 2017-03-10 NOTE — Progress Notes (Signed)
[facility] Office:  [phone] General Surgery Progress Note   LOS: 3 days  POD -  2 Days Post-Op  Chief Complaint: Chest pain  Assessment and Plan: 1.  IRRIGATION AND DEBRIDEMENT LEFT CHEST ABSCESS - 03/08/2017 - Gross  Upper extremity doppler prelim neg  WBC - 16,100 - 03/09/2017  Vancomycin (Zosyn stopped by ID on 10/20)  REmainder of packing removed - wound has a nasty drainage - concerned that this is polymicrobial.  (Discussed with Dr. Johnnye Sima)  Will start showers today.  He possibly may need to go back to the OR to open the wounds more widely.  Will decide this week.   2.  MRSA 3.  IV drug abuse 4.  Smokes 5.  DVT prophylaxis - Lovenox   Principal Problem:   Abscess of parasternal chest wall s/p I&D 03/08/2017 Active Problems:   IVDU (intravenous drug user)   Pyomyositis   Cellulitis of pectoral region   Cocaine abuse ([place])   Tobacco abuse   Bacteremia   Abscess of right chest wall s/p I&D 03/08/2017  Subjective:  About the same as yesterday.  Mother, Altha Harm, in room.  Explained findings to her.  Objective:   Vitals:   03/09/17 2239 03/10/17 0700  BP: 125/62 122/82  Pulse: 95 90  Resp: 18 18  Temp: 98.3 F (36.8 C) 98 F (36.7 C)  SpO2: 100% 100%     Intake/Output from previous day:  10/20 0701 - 10/21 0700 In: 2774 [P.O.:960; I.V.:2396; IV Piggyback:600] Out: 300 [Urine:300]  Intake/Output this shift:  No intake/output data recorded.   Physical Exam:   General: Younger WM who is alert and oriented.    HEENT: Normal. Pupils equal. .   Lungs: Clear   Wound: Remainder of packing removed.  The wounds still look nasty - with mixed blood and purulence.  To shower wounds.  Continue packing BID   Lab Results:     Recent Labs  03/08/17 0417 03/09/17 0940  WBC 17.8* 16.1*  HGB 12.0* 11.7*  HCT 36.4* 33.1*  PLT 287 339    BMET    Recent Labs  03/08/17 0417 03/09/17 0940  NA 135 136  K 4.7 4.4  CL 99* 103  CO2 26 22   GLUCOSE 130* 137*  BUN 6 7  CREATININE 0.57* 0.56*  CALCIUM 8.5* 8.0*    PT/INR  No results for input(s): LABPROT, INR in the last 72 hours.  ABG  No results for input(s): PHART, HCO3 in the last 72 hours.  Invalid input(s): PCO2, PO2   Studies/Results:  No results found.   Anti-infectives:   Anti-infectives    Start     Dose/Rate Route Frequency Ordered Stop   03/08/17 2230  piperacillin-tazobactam (ZOSYN) IVPB 3.375 g  Status:  Discontinued     3.375 g 12.5 mL/hr over 240 Minutes Intravenous Every 8 hours 03/08/17 2215 03/09/17 1633   03/08/17 1845  ceFAZolin (ANCEF) IVPB 2g/100 mL premix  Status:  Discontinued     2 g 200 mL/hr over 30 Minutes Intravenous  Once 03/08/17 1836 03/08/17 2029   03/08/17 1838  ceFAZolin (ANCEF) 2-4 GM/100ML-% IVPB    Comments:  [name]   : cabinet override      03/08/17 1838 03/08/17 1845   03/07/17 2000  vancomycin (VANCOCIN) IVPB 1000 mg/200 mL premix     1,000 mg 200 mL/hr over 60 Minutes Intravenous Every 8 hours 03/07/17 1346     03/07/17 1800  vancomycin (VANCOCIN) IVPB 1000  mg/200 mL premix  Status:  Discontinued     1,000 mg 200 mL/hr over 60 Minutes Intravenous Every 8 hours 03/07/17 1047 03/07/17 1346   03/07/17 1100  vancomycin (VANCOCIN) 1,500 mg in sodium chloride 0.9 % 500 mL IVPB     1,500 mg 250 mL/hr over 120 Minutes Intravenous  Once 03/07/17 1044 03/07/17 1537      Alphonsa Overall, MD, FACS Pager: [facility] Office: [phone] 03/10/2017

## 2017-03-11 DIAGNOSIS — R195 Other fecal abnormalities: Secondary | ICD-10-CM

## 2017-03-11 LAB — BASIC METABOLIC PANEL
Anion gap: 7 (ref 5–15)
BUN: 8 mg/dL (ref 6–20)
CHLORIDE: 106 mmol/L (ref 101–111)
CO2: 26 mmol/L (ref 22–32)
Calcium: 8.4 mg/dL — ABNORMAL LOW (ref 8.9–10.3)
Creatinine, Ser: 0.51 mg/dL — ABNORMAL LOW (ref 0.61–1.24)
GFR calc Af Amer: >60 mL/min (ref >60)
GFR calc non Af Amer: >60 mL/min (ref >60)
GLUCOSE: 126 mg/dL — AB (ref 65–99)
POTASSIUM: 4.1 mmol/L (ref 3.5–5.1)
Sodium: 139 mmol/L (ref 135–145)

## 2017-03-11 LAB — CBC
HEMATOCRIT: 36.3 % — AB (ref 39.0–52.0)
HEMOGLOBIN: 11.9 g/dL — AB (ref 13.0–17.0)
MCH: 28.8 pg (ref 26.0–34.0)
MCHC: 32.8 g/dL (ref 30.0–36.0)
MCV: 87.9 fL (ref 78.0–100.0)
Platelets: 387 10*3/uL (ref 150–400)
RBC: 4.13 MIL/uL — AB (ref 4.22–5.81)
RDW: 13.4 % (ref 11.5–15.5)
WBC: 10.9 10*3/uL — ABNORMAL HIGH (ref 4.0–10.5)

## 2017-03-11 LAB — C DIFFICILE QUICK SCREEN W PCR REFLEX
C DIFFICLE (CDIFF) ANTIGEN: NEGATIVE
C Diff interpretation: NOT DETECTED
C Diff toxin: NEGATIVE

## 2017-03-11 LAB — CULTURE, BLOOD (ROUTINE X 2)
CULTURE: NO GROWTH
SPECIAL REQUESTS: ADEQUATE

## 2017-03-11 LAB — AEROBIC CULTURE W GRAM STAIN (SUPERFICIAL SPECIMEN)

## 2017-03-11 LAB — GC/CHLAMYDIA PROBE AMP (~~LOC~~) NOT AT ARMC
Chlamydia: NEGATIVE
Neisseria Gonorrhea: NEGATIVE

## 2017-03-11 LAB — AEROBIC CULTURE  (SUPERFICIAL SPECIMEN)

## 2017-03-11 LAB — HCV RNA QUANT RFLX ULTRA OR GENOTYP
HCV RNA QNT(LOG COPY/ML): UNDETERMINED {Log_IU}/mL
HEPATITIS C QUANTITATION: NOT DETECTED [IU]/mL

## 2017-03-11 LAB — HIV ANTIBODY (ROUTINE TESTING W REFLEX): HIV Screen 4th Generation wRfx: NONREACTIVE

## 2017-03-11 MED ORDER — DAKINS (1/4 STRENGTH) 0.125 % EX SOLN
Freq: Four times a day (QID) | CUTANEOUS | Status: DC
  Administered 2017-03-11 – 2017-03-13 (×9)
  Filled 2017-03-11 (×2): qty 473

## 2017-03-11 MED ORDER — SACCHAROMYCES BOULARDII 250 MG PO CAPS
250.0000 mg | ORAL_CAPSULE | Freq: Two times a day (BID) | ORAL | Status: DC
  Administered 2017-03-11 – 2017-03-13 (×4): 250 mg via ORAL
  Filled 2017-03-11 (×4): qty 1

## 2017-03-11 NOTE — Progress Notes (Signed)
PROGRESS NOTE                                                                                                                                                                                                             Patient Demographics:    Joseph Nunez, is a 21 y.o. male, DOB - April 09, 1996, STM:[id number]  Admit date - 03/07/2017   Admitting Physician Albertine Patricia, MD  Outpatient Primary MD for the patient is Patient, No Pcp Per  LOS - 4   No chief complaint on file.      Brief Narrative    21 y.o. male, ith past medical history of polysubstance abuse, he reports heroin, tobacco, cocaine, IV drugs, patient was admitted 03/06/2017, for upper chest wall cellulitis with Pyomyositis, was started empirically on IV vancomycin and Azactam, he was smoking in his room, asking only for IV pain medication, he did sign AMA the same day, his blood cultures came back positive for MRSA bacteremia , I have called patient to come back for direct admission for treatment of his bacteremia,TEE 03/08/2017 with no evidence of endocarditis, surgery consulted regarding left chest wall abscess with plan for surgical intervention.   Subjective:    Joseph Nunez no fever, no nausea, no vomiting.  Patient with increased purulence discharge out of his chest wall wound and continue complaining of chest pain.  No shortness of breath, no abdominal pain.  There is also reports of increase loose stools.   Assessment  & Plan :    Principal Problem:   Abscesses of parasternal chest wall s/p I&D 03/08/2017 Active Problems:   IVDU (intravenous drug user)   Pyomyositis   Cellulitis of pectoral region   Cocaine abuse ([place])   Tobacco abuse   Bacteremia   Abscess of right chest wall s/p I&D 03/08/2017   Sepsis due to MRSA bacteremia/left pectoralis muscle cellulitis, Pyomyositis and abscess - patient with fever, leukocytosis and tachycardia on admission (meeting  sepsis criteria). -sepsis features resolving now, will monitor -wound with increased purulent drainage  -will continue Zosyn and vancomycin.   -patient s/p I&D (see below); continue wound care and follow surgery recommendations. -continue supportive care, PRN analgesics and IV antibiotics -TEE neg for endocarditis  Left Upper chest wall cellulitis /abscess -Likely due to seeding from bacteremia, continue with IV antibiotics. -patient s/p extensive I&D  on 10/19 -ID on board, will follow rec's -continue IV antibiotics, after discussing with surgeons there is concerns for poly bacteria and zosyn has been restarted.  -Continue wound care and dressing changes twice a day; it is a pretty high chances that the patient will require another trip to the OR for further debridement. -repeat blood cultures pending  -continue PRN analgesics  Polysubstance abuse/IV drug abuse -cessation counseling provided  -patient was receptive and considering to stop -no active signs of withdrawal appreciated -will continue ativan and atarax PRN -SW involved to provide outpatient resources to assist him quit.   Tobacco abuse -cessation counseling provided -continue nicotine patch while inpatient  6mm left upper lobe subpleural nodule: -Patient has this incidental finding on CT of the chest -will need non contrast CT chest in 12 months. -no SOB, no weight loss -had hx of smoking   Loose stools -Patient will be check for C. Diff -Start Florastor twice a day  Code Status : Full  Family Communication  : no family at bedside   Disposition Plan  : Home when stable  Consults  : surgery, ID  Procedures  : TEE 03/08/2017: no vegetations seen.  DVT Prophylaxis  :  Lovenox  Lab Results  Component Value Date   PLT 387 03/11/2017    Antibiotics  :    Anti-infectives    Start     Dose/Rate Route Frequency Ordered Stop   03/10/17 2000  vancomycin (VANCOCIN) 1,250 mg in sodium chloride 0.9 % 250 mL  IVPB     1,250 mg 166.7 mL/hr over 90 Minutes Intravenous Every 8 hours 03/10/17 1359     03/10/17 1030  piperacillin-tazobactam (ZOSYN) IVPB 3.375 g     3.375 g 12.5 mL/hr over 240 Minutes Intravenous Every 8 hours 03/10/17 1008     03/08/17 2230  piperacillin-tazobactam (ZOSYN) IVPB 3.375 g  Status:  Discontinued     3.375 g 12.5 mL/hr over 240 Minutes Intravenous Every 8 hours 03/08/17 2215 03/09/17 1633   03/08/17 1845  ceFAZolin (ANCEF) IVPB 2g/100 mL premix  Status:  Discontinued     2 g 200 mL/hr over 30 Minutes Intravenous  Once 03/08/17 1836 03/08/17 2029   03/08/17 1838  ceFAZolin (ANCEF) 2-4 GM/100ML-% IVPB    Comments:  Mentley, Pamela   : cabinet override      03/08/17 1838 03/08/17 1845   03/07/17 2000  vancomycin (VANCOCIN) IVPB 1000 mg/200 mL premix  Status:  Discontinued     1,000 mg 200 mL/hr over 60 Minutes Intravenous Every 8 hours 03/07/17 1346 03/10/17 1359   03/07/17 1800  vancomycin (VANCOCIN) IVPB 1000 mg/200 mL premix  Status:  Discontinued     1,000 mg 200 mL/hr over 60 Minutes Intravenous Every 8 hours 03/07/17 1047 03/07/17 1346   03/07/17 1100  vancomycin (VANCOCIN) 1,500 mg in sodium chloride 0.9 % 500 mL IVPB     1,500 mg 250 mL/hr over 120 Minutes Intravenous  Once 03/07/17 1044 03/07/17 1537        Objective:   Vitals:   03/10/17 1310 03/10/17 2305 03/11/17 0613 03/11/17 1609  BP: 124/74 119/71 125/72 120/70  Pulse: 95 75 81 97  Resp: 18 20 20 20   Temp: 98.4 F (36.9 C) 98.5 F (36.9 C) 98.7 F (37.1 C) 99 F (37.2 C)  TempSrc: Oral Oral Oral Oral  SpO2: 98% 100% 100% 100%  Weight:      Height:        Wt Readings from Last  3 Encounters:  03/08/17 61.2 kg (135 lb)  03/06/17 65.8 kg (145 lb)    Intake/Output Summary (Last 24 hours) at 03/11/17 1827 Last data filed at 03/11/17 1230  Gross per 24 hour  Intake          2954.67 ml  Output                0 ml  Net          2954.67 ml    Physical Exam GEN: No fever, no shortness of  breath, no nausea, no abdominal pain, no vomiting.  Patient reports pain in his chest wall around area of incision. Patient also reported multiple loose stools. Cardiovascular: S1 and S2, no rubs, no gallops, no murmurs.   Resp: Clear to auscultation bilaterally  Abd: Soft, nontender, nondistended, positive bowel sounds.   Extremities: No edema, no cyanosis, no clubbing.   Skin: Patient continued to express chest discomfort around chest wall incision; clean dressings are currently applied to his wound; But the patient is actively having purulent discharges out of the wound.     Data Review:    CBC  Recent Labs Lab 03/05/17 2015 03/07/17 1215 03/08/17 0417 03/09/17 0940 03/11/17 0424  WBC 15.8* 20.1* 17.8* 16.1* 10.9*  HGB 12.9* 12.4* 12.0* 11.7* 11.9*  HCT 38.1* 36.1* 36.4* 33.1* 36.3*  PLT 237 327 287 339 387  MCV 86.4 86.4 86.5 83.8 87.9  MCH 29.3 29.7 28.5 29.6 28.8  MCHC 33.9 34.3 33.0 35.3 32.8  RDW 12.7 12.9 13.2 13.3 13.4  LYMPHSABS 1.4  --   --   --   --   MONOABS 1.2*  --   --   --   --   EOSABS 0.1  --   --   --   --   BASOSABS 0.0  --   --   --   --     Chemistries   Recent Labs Lab 03/05/17 2015 03/06/17 0515 03/07/17 1215 03/08/17 0417 03/09/17 0940 03/10/17 0417 03/11/17 0424  NA 133* 135 138 135 136  --  139  K 4.2 4.3 3.8 4.7 4.4  --  4.1  CL 96* 102 99* 99* 103  --  106  CO2 24 24 28 26 22   --  26  GLUCOSE 154* 125* 121* 130* 137*  --  126*  BUN 6 <5* 6 6 7   --  8  CREATININE 0.65 0.60* 0.60* 0.57* 0.56*  --  0.51*  CALCIUM 8.6* 8.3* 9.0 8.5* 8.0*  --  8.4*  AST 13*  --  16  --   --   --   --   ALT 12*  --  13*  --   --  BLOOD  --   ALKPHOS 85  --  94  --   --   --   --   BILITOT 0.5  --  0.6  --   --   --   --    Inpatient Medications  Scheduled Meds: . acetaminophen  1,000 mg Oral TID  . enoxaparin (LOVENOX) injection  40 mg Subcutaneous Q24H  . ibuprofen  800 mg Oral QID  . lip balm  1 application Topical BID  . nicotine  21 mg  Transdermal Daily  . pantoprazole  40 mg Oral Q1200  . polyethylene glycol  17 g Oral Daily  . sodium chloride flush  3 mL Intravenous Q12H  . sodium hypochlorite   Irrigation QID   Continuous Infusions: .  sodium chloride 100 mL/hr at 03/08/17 2050  . chlorproMAZINE (THORAZINE) IV    . methocarbamol (ROBAXIN)  IV    . piperacillin-tazobactam (ZOSYN)  IV 3.375 g (03/11/17 1707)  . vancomycin Stopped (03/11/17 1355)   PRN Meds:.acetaminophen **OR** acetaminophen, alum & mag hydroxide-simeth, bisacodyl, chlorproMAZINE (THORAZINE) IV, diphenhydrAMINE, guaiFENesin-dextromethorphan, hydrocortisone, hydrocortisone cream, hydrOXYzine, LORazepam, magic mouthwash, menthol-cetylpyridinium, methocarbamol (ROBAXIN)  IV, methocarbamol, metoCLOPramide (REGLAN) injection, morphine injection, oxyCODONE, phenol, polyethylene glycol, prochlorperazine  Micro Results Recent Results (from the past 240 hour(s))  Culture, blood (routine x 2)     Status: Abnormal   Collection Time: 03/06/17  1:55 AM  Result Value Ref Range Status   Specimen Description BLOOD LEFT ANTECUBITAL  Final   Special Requests   Final    BOTTLES DRAWN AEROBIC AND ANAEROBIC Blood Culture adequate volume   Culture  Setup Time   Final    GRAM POSITIVE COCCI IN CLUSTERS IN BOTH AEROBIC AND ANAEROBIC BOTTLES CRITICAL RESULT CALLED TO, READ BACK BY AND VERIFIED WITH: DR. [facility], AT 0715 03/07/17 BY Rush Landmark Performed at [facility], [place] [address], [place], [place] [address]    Culture METHICILLIN RESISTANT STAPHYLOCOCCUS AUREUS (A)  Final   Report Status 03/08/2017 FINAL  Final   Organism ID, Bacteria METHICILLIN RESISTANT STAPHYLOCOCCUS AUREUS  Final      Susceptibility   Methicillin resistant staphylococcus aureus - MIC*    CIPROFLOXACIN <=0.5 SENSITIVE Sensitive     ERYTHROMYCIN >=8 RESISTANT Resistant     GENTAMICIN <=0.5 SENSITIVE Sensitive     OXACILLIN >=4 RESISTANT Resistant     TETRACYCLINE <=1 SENSITIVE Sensitive      VANCOMYCIN <=0.5 SENSITIVE Sensitive     TRIMETH/SULFA <=10 SENSITIVE Sensitive     CLINDAMYCIN <=0.25 SENSITIVE Sensitive     RIFAMPIN <=0.5 SENSITIVE Sensitive     Inducible Clindamycin NEGATIVE Sensitive     * METHICILLIN RESISTANT STAPHYLOCOCCUS AUREUS  Blood Culture ID Panel (Reflexed)     Status: Abnormal   Collection Time: 03/06/17  1:55 AM  Result Value Ref Range Status   Enterococcus species NOT DETECTED NOT DETECTED Final   Listeria monocytogenes NOT DETECTED NOT DETECTED Final   Staphylococcus species DETECTED (A) NOT DETECTED Final    Comment: CRITICAL RESULT CALLED TO, READ BACK BY AND VERIFIED WITH: DR. [facility], AT 0715 03/07/17 BY D. VANHOOK    Staphylococcus aureus DETECTED (A) NOT DETECTED Final    Comment: Methicillin (oxacillin)-resistant Staphylococcus aureus (MRSA). MRSA is predictably resistant to beta-lactam antibiotics (except ceftaroline). Preferred therapy is vancomycin unless clinically contraindicated. Patient requires contact precautions if  hospitalized. CRITICAL RESULT CALLED TO, READ BACK BY AND VERIFIED WITH: DR. [facility], AT 0715 03/07/17 BY D. VANHOOK    Methicillin resistance DETECTED (A) NOT DETECTED Final    Comment: CRITICAL RESULT CALLED TO, READ BACK BY AND VERIFIED WITH: DR. [facility], AT 0715 03/07/17 BY D. VANHOOK    Streptococcus species NOT DETECTED NOT DETECTED Final   Streptococcus agalactiae NOT DETECTED NOT DETECTED Final   Streptococcus pneumoniae NOT DETECTED NOT DETECTED Final   Streptococcus pyogenes NOT DETECTED NOT DETECTED Final   Acinetobacter baumannii NOT DETECTED NOT DETECTED Final   Enterobacteriaceae species NOT DETECTED NOT DETECTED Final   Enterobacter cloacae complex NOT DETECTED NOT DETECTED Final   Escherichia coli NOT DETECTED NOT DETECTED Final   Klebsiella oxytoca NOT DETECTED NOT DETECTED Final   Klebsiella pneumoniae NOT DETECTED NOT DETECTED Final   Proteus species NOT DETECTED NOT DETECTED Final    Serratia  marcescens NOT DETECTED NOT DETECTED Final   Haemophilus influenzae NOT DETECTED NOT DETECTED Final   Neisseria meningitidis NOT DETECTED NOT DETECTED Final   Pseudomonas aeruginosa NOT DETECTED NOT DETECTED Final   Candida albicans NOT DETECTED NOT DETECTED Final   Candida glabrata NOT DETECTED NOT DETECTED Final   Candida krusei NOT DETECTED NOT DETECTED Final   Candida parapsilosis NOT DETECTED NOT DETECTED Final   Candida tropicalis NOT DETECTED NOT DETECTED Final    Comment: Performed at [facility], [place] [address] [place], [place] [address]  Culture, blood (routine x 2)     Status: None   Collection Time: 03/06/17  2:51 PM  Result Value Ref Range Status   Specimen Description BLOOD LEFT ARM  Final   Special Requests IN PEDIATRIC BOTTLE Blood Culture adequate volume  Final   Culture   Final    NO GROWTH 5 DAYS Performed at [facility], [place] [address], [place], [place] [address]    Report Status 03/11/2017 FINAL  Final  Culture, blood (Routine X 2) w Reflex to ID Panel     Status: None (Preliminary result)   Collection Time: 03/07/17 12:15 PM  Result Value Ref Range Status   Specimen Description BLOOD LEFT ARM  Final   Special Requests   Final    BOTTLES DRAWN AEROBIC ONLY Blood Culture adequate volume   Culture   Final    NO GROWTH 4 DAYS Performed at [facility], [place] [address], [place], [place] [address]    Report Status PENDING  Incomplete  Culture, blood (Routine X 2) w Reflex to ID Panel     Status: Abnormal   Collection Time: 03/07/17 12:15 PM  Result Value Ref Range Status   Specimen Description BLOOD RIGHT ANTECUBITAL  Final   Special Requests IN PEDIATRIC BOTTLE Blood Culture adequate volume  Final   Culture  Setup Time   Final    GRAM POSITIVE COCCI IN CLUSTERS IN PEDIATRIC BOTTLE CRITICAL RESULT CALLED TO, READ BACK BY AND VERIFIED WITH: N. Sherwood PHARMD, AT [place] 03/08/17 BY D. VANHOOK    Culture (A)  Final    STAPHYLOCOCCUS  AUREUS SUSCEPTIBILITIES PERFORMED ON PREVIOUS CULTURE WITHIN THE LAST 5 DAYS. Performed at [facility], [place] [address] [place], [place] [address]    Report Status 03/09/2017 FINAL  Final  MRSA PCR Screening     Status: None   Collection Time: 03/08/17 10:30 AM  Result Value Ref Range Status   MRSA by PCR NEGATIVE NEGATIVE Final    Comment:        The GeneXpert MRSA Assay (FDA approved for NASAL specimens only), is one component of a comprehensive MRSA colonization surveillance program. It is not intended to diagnose MRSA infection nor to guide or monitor treatment for MRSA infections.   Aerobic Culture (superficial specimen)     Status: None   Collection Time: 03/08/17  6:46 PM  Result Value Ref Range Status   Specimen Description ABSCESS LEFT PECTORAL  Final   Special Requests NONE  Final   Gram Stain   Final    ABUNDANT WBC PRESENT, PREDOMINANTLY PMN ABUNDANT GRAM POSITIVE COCCI IN CLUSTERS Performed at [facility], [address] [place], [place] [address]    Culture   Final    ABUNDANT METHICILLIN RESISTANT STAPHYLOCOCCUS AUREUS   Report Status 03/11/2017 FINAL  Final   Organism ID, Bacteria METHICILLIN RESISTANT STAPHYLOCOCCUS AUREUS  Final  Susceptibility   Methicillin resistant staphylococcus aureus - MIC*    CIPROFLOXACIN <=0.5 SENSITIVE Sensitive     ERYTHROMYCIN >=8 RESISTANT Resistant     GENTAMICIN <=0.5 SENSITIVE Sensitive     OXACILLIN >=4 RESISTANT Resistant     TETRACYCLINE <=1 SENSITIVE Sensitive     VANCOMYCIN 1 SENSITIVE Sensitive     TRIMETH/SULFA <=10 SENSITIVE Sensitive     CLINDAMYCIN <=0.25 SENSITIVE Sensitive     RIFAMPIN <=0.5 SENSITIVE Sensitive     Inducible Clindamycin NEGATIVE Sensitive     * ABUNDANT METHICILLIN RESISTANT STAPHYLOCOCCUS AUREUS  C difficile quick scan w PCR reflex     Status: None   Collection Time: 03/11/17 12:50 PM  Result Value Ref Range Status   C Diff antigen NEGATIVE NEGATIVE Final   C Diff  toxin NEGATIVE NEGATIVE Final   C Diff interpretation No C. difficile detected.  Final    Radiology Reports Dg Chest 2 View  Result Date: 03/05/2017 CLINICAL DATA:  Chest pain EXAM: CHEST  2 VIEW COMPARISON:  None. FINDINGS: Lungs are clear. Heart size and pulmonary vascularity are normal. No adenopathy. No pneumothorax. No bone lesions. IMPRESSION: No edema or consolidation. Electronically Signed   By: Lowella Grip III M.D.   On: 03/05/2017 19:09   Dg Sternum  Result Date: 03/05/2017 CLINICAL DATA:  Pain after twisting injury.  Heroin user. EXAM: STERNUM - 1+ VIEW COMPARISON:  Chest radiograph of earlier today. FINDINGS: Single lateral view of the sternum demonstrates no focal osseous abnormality. No retrosternal soft tissue mass. No subcutaneous gas or osseous destruction. IMPRESSION: No acute osseous abnormality. Electronically Signed   By: Abigail Miyamoto M.D.   On: 03/05/2017 19:28   Ct Chest W Contrast  Result Date: 03/06/2017 CLINICAL DATA:  Patient developed pectoral pain on the right after throwing an orange. Leukocytosis. EXAM: CT CHEST WITH CONTRAST TECHNIQUE: Multidetector CT imaging of the chest was performed during intravenous contrast administration. CONTRAST:  75 cc Isovue-300 COMPARISON:  None. FINDINGS: Cardiovascular: Normal heart size without pericardial effusion. Normal ao aortic caliber. No large central pulmonary embolus though the study is not tailored for assessment of pulmonary emboli. Mediastinum/Nodes: No enlarged mediastinal, hilar, or axillary lymph nodes. Thyroid gland, trachea, and esophagus demonstrate no significant findings. Lungs/Pleura: 4 mm left upper lobe subpleural nodule. No pneumonic consolidation, effusion or pneumothorax. Upper Abdomen: No acute abnormality. Musculoskeletal: Asymmetric enlargement with subtle intramuscular heterogeneous enhancement of the left pectoralis muscle relative to right. Findings are suspicious for pyomyositis. No intramuscular  hemorrhage. There is overlying soft tissue induration and inflammation though the anterior chest wall extending to the upper abdomen. IMPRESSION: 1. Asymmetric enlargement of the left pectoralis muscle relative to right. There is subtle intramuscular enhancement suspicious for a developing pyomyositis of the left pectoralis. Overlying subcutaneous fatty induration is seen of the anterior chest wall that may reflect a cellulitis. These results were called by telephone at the time of interpretation on 03/06/2017 at 1:05 am to [place], who verbally acknowledged these results. 2. 4 mm left upper lobe subpleural nodule. No follow-up needed if patient is low-risk. Non-contrast chest CT can be considered in 12 months if patient is high-risk. This recommendation follows the consensus statement: Guidelines for Management of Incidental Pulmonary Nodules Detected on CT Images: From the Fleischner Society 2017; Radiology 2017; 284:228-243. Electronically Signed   By: Ashley Royalty M.D.   On: 03/06/2017 01:06    Time Spent in minutes  25 minutes   Barton Dubois M.D on 03/11/2017 at  6:27 PM  Between 7am to 7pm - Pager - [phone]  After 7pm go to [email or web address] - password [facility]  [facility] -  Office  [phone]

## 2017-03-11 NOTE — Progress Notes (Addendum)
INFECTIOUS DISEASE PROGRESS NOTE  ID: Joseph Nunez is a 21 y.o. male with  Principal Problem:   Abscesses of parasternal chest wall s/p I&D 03/08/2017 Active Problems:   IVDU (intravenous drug user)   Pyomyositis   Cellulitis of pectoral region   Cocaine abuse ([place])   Tobacco abuse   Bacteremia   Abscess of right chest wall s/p I&D 03/08/2017  Subjective: No complaints.   Abtx:  Anti-infectives    Start     Dose/Rate Route Frequency Ordered Stop   03/10/17 2000  vancomycin (VANCOCIN) 1,250 mg in sodium chloride 0.9 % 250 mL IVPB     1,250 mg 166.7 mL/hr over 90 Minutes Intravenous Every 8 hours 03/10/17 1359     03/10/17 1030  piperacillin-tazobactam (ZOSYN) IVPB 3.375 g     3.375 g 12.5 mL/hr over 240 Minutes Intravenous Every 8 hours 03/10/17 1008     03/08/17 2230  piperacillin-tazobactam (ZOSYN) IVPB 3.375 g  Status:  Discontinued     3.375 g 12.5 mL/hr over 240 Minutes Intravenous Every 8 hours 03/08/17 2215 03/09/17 1633   03/08/17 1845  ceFAZolin (ANCEF) IVPB 2g/100 mL premix  Status:  Discontinued     2 g 200 mL/hr over 30 Minutes Intravenous  Once 03/08/17 1836 03/08/17 2029   03/08/17 1838  ceFAZolin (ANCEF) 2-4 GM/100ML-% IVPB    Comments:  [name]   : cabinet override      03/08/17 1838 03/08/17 1845   03/07/17 2000  vancomycin (VANCOCIN) IVPB 1000 mg/200 mL premix  Status:  Discontinued     1,000 mg 200 mL/hr over 60 Minutes Intravenous Every 8 hours 03/07/17 1346 03/10/17 1359   03/07/17 1800  vancomycin (VANCOCIN) IVPB 1000 mg/200 mL premix  Status:  Discontinued     1,000 mg 200 mL/hr over 60 Minutes Intravenous Every 8 hours 03/07/17 1047 03/07/17 1346   03/07/17 1100  vancomycin (VANCOCIN) 1,500 mg in sodium chloride 0.9 % 500 mL IVPB     1,500 mg 250 mL/hr over 120 Minutes Intravenous  Once 03/07/17 1044 03/07/17 1537      Medications:  Scheduled: . acetaminophen  1,000 mg Oral TID  . enoxaparin (LOVENOX) injection  40 mg  Subcutaneous Q24H  . ibuprofen  800 mg Oral QID  . lip balm  1 application Topical BID  . nicotine  21 mg Transdermal Daily  . pantoprazole  40 mg Oral Q1200  . polyethylene glycol  17 g Oral Daily  . sodium chloride flush  3 mL Intravenous Q12H  . sodium hypochlorite   Irrigation QID    Objective: Vital signs in last 24 hours: Temp:  [98.4 F (36.9 C)-98.7 F (37.1 C)] 98.7 F (37.1 C) (10/22 8676) Pulse Rate:  [75-95] 81 (10/22 0613) Resp:  [18-20] 20 (10/22 1950) BP: (119-125)/(71-74) 125/72 (10/22 9326) SpO2:  [98 %-100 %] 100 % (10/22 7124)   General appearance: alert, cooperative and no distress Resp: clear to auscultation bilaterally Cardio: regular rate and rhythm and +murmur GI: normal findings: bowel sounds normal and soft, non-tender Incision/Wound: wounds packed. Non-tender. No surrounding erythema.   Lab Results  Recent Labs  03/09/17 0940 03/11/17 0424  WBC 16.1* 10.9*  HGB 11.7* 11.9*  HCT 33.1* 36.3*  NA 136 139  K 4.4 4.1  CL 103 106  CO2 22 26  BUN 7 8  CREATININE 0.56* 0.51*   Liver Panel  Recent Labs  03/10/17 0417  ALT BLOOD   Sedimentation Rate No results for input(s):  ESRSEDRATE in the last 72 hours. C-Reactive Protein No results for input(s): CRP in the last 72 hours.  Microbiology: Recent Results (from the past 240 hour(s))  Culture, blood (routine x 2)     Status: Abnormal   Collection Time: 03/06/17  1:55 AM  Result Value Ref Range Status   Specimen Description BLOOD LEFT ANTECUBITAL  Final   Special Requests   Final    BOTTLES DRAWN AEROBIC AND ANAEROBIC Blood Culture adequate volume   Culture  Setup Time   Final    GRAM POSITIVE COCCI IN CLUSTERS IN BOTH AEROBIC AND ANAEROBIC BOTTLES CRITICAL RESULT CALLED TO, READ BACK BY AND VERIFIED WITH: DR. Waldron [facility], AT 0715 03/07/17 BY Rush Landmark Performed at [facility], [place] [address] [place], [place] [address]    Culture METHICILLIN RESISTANT STAPHYLOCOCCUS AUREUS (A)   Final   Report Status 03/08/2017 FINAL  Final   Organism ID, Bacteria METHICILLIN RESISTANT STAPHYLOCOCCUS AUREUS  Final      Susceptibility   Methicillin resistant staphylococcus aureus - MIC*    CIPROFLOXACIN <=0.5 SENSITIVE Sensitive     ERYTHROMYCIN >=8 RESISTANT Resistant     GENTAMICIN <=0.5 SENSITIVE Sensitive     OXACILLIN >=4 RESISTANT Resistant     TETRACYCLINE <=1 SENSITIVE Sensitive     VANCOMYCIN <=0.5 SENSITIVE Sensitive     TRIMETH/SULFA <=10 SENSITIVE Sensitive     CLINDAMYCIN <=0.25 SENSITIVE Sensitive     RIFAMPIN <=0.5 SENSITIVE Sensitive     Inducible Clindamycin NEGATIVE Sensitive     * METHICILLIN RESISTANT STAPHYLOCOCCUS AUREUS  Blood Culture ID Panel (Reflexed)     Status: Abnormal   Collection Time: 03/06/17  1:55 AM  Result Value Ref Range Status   Enterococcus species NOT DETECTED NOT DETECTED Final   Listeria monocytogenes NOT DETECTED NOT DETECTED Final   Staphylococcus species DETECTED (A) NOT DETECTED Final    Comment: CRITICAL RESULT CALLED TO, READ BACK BY AND VERIFIED WITH: DR. [facility]ron [facility], AT 0715 03/07/17 BY D. VANHOOK    Staphylococcus aureus DETECTED (A) NOT DETECTED Final    Comment: Methicillin (oxacillin)-resistant Staphylococcus aureus (MRSA). MRSA is predictably resistant to beta-lactam antibiotics (except ceftaroline). Preferred therapy is vancomycin unless clinically contraindicated. Patient requires contact precautions if  hospitalized. CRITICAL RESULT CALLED TO, READ BACK BY AND VERIFIED WITH: DR. [facility]ron [facility], AT 0715 03/07/17 BY D. VANHOOK    Methicillin resistance DETECTED (A) NOT DETECTED Final    Comment: CRITICAL RESULT CALLED TO, READ BACK BY AND VERIFIED WITH: DR. [facility]ron [facility], AT 0715 03/07/17 BY D. VANHOOK    Streptococcus species NOT DETECTED NOT DETECTED Final   Streptococcus agalactiae NOT DETECTED NOT DETECTED Final   Streptococcus pneumoniae NOT DETECTED NOT DETECTED Final   Streptococcus pyogenes NOT DETECTED NOT DETECTED  Final   Acinetobacter baumannii NOT DETECTED NOT DETECTED Final   Enterobacteriaceae species NOT DETECTED NOT DETECTED Final   Enterobacter cloacae complex NOT DETECTED NOT DETECTED Final   Escherichia coli NOT DETECTED NOT DETECTED Final   Klebsiella oxytoca NOT DETECTED NOT DETECTED Final   Klebsiella pneumoniae NOT DETECTED NOT DETECTED Final   Proteus species NOT DETECTED NOT DETECTED Final   Serratia marcescens NOT DETECTED NOT DETECTED Final   Haemophilus influenzae NOT DETECTED NOT DETECTED Final   Neisseria meningitidis NOT DETECTED NOT DETECTED Final   Pseudomonas aeruginosa NOT DETECTED NOT DETECTED Final   Candida albicans NOT DETECTED NOT DETECTED Final   Candida glabrata NOT DETECTED NOT DETECTED Final   Candida krusei NOT DETECTED NOT DETECTED Final  Candida parapsilosis NOT DETECTED NOT DETECTED Final   Candida tropicalis NOT DETECTED NOT DETECTED Final    Comment: Performed at [facility], [place] [address] [place], [place] [address]  Culture, blood (routine x 2)     Status: None   Collection Time: 03/06/17  2:51 PM  Result Value Ref Range Status   Specimen Description BLOOD LEFT ARM  Final   Special Requests IN PEDIATRIC BOTTLE Blood Culture adequate volume  Final   Culture   Final    NO GROWTH 5 DAYS Performed at [facility], [place] [address], [place], [place] [address]    Report Status 03/11/2017 FINAL  Final  Culture, blood (Routine X 2) w Reflex to ID Panel     Status: None (Preliminary result)   Collection Time: 03/07/17 12:15 PM  Result Value Ref Range Status   Specimen Description BLOOD LEFT ARM  Final   Special Requests   Final    BOTTLES DRAWN AEROBIC ONLY Blood Culture adequate volume   Culture   Final    NO GROWTH 4 DAYS Performed at [facility], [place] [address], [place], [place] [address]    Report Status PENDING  Incomplete  Culture, blood (Routine X 2) w Reflex to ID Panel     Status: Abnormal   Collection Time: 03/07/17 12:15 PM   Result Value Ref Range Status   Specimen Description BLOOD RIGHT ANTECUBITAL  Final   Special Requests IN PEDIATRIC BOTTLE Blood Culture adequate volume  Final   Culture  Setup Time   Final    GRAM POSITIVE COCCI IN CLUSTERS IN PEDIATRIC BOTTLE CRITICAL RESULT CALLED TO, READ BACK BY AND VERIFIED WITH: N. [place] PHARMD, AT [place] 03/08/17 BY D. VANHOOK    Culture (A)  Final    STAPHYLOCOCCUS AUREUS SUSCEPTIBILITIES PERFORMED ON PREVIOUS CULTURE WITHIN THE LAST 5 DAYS. Performed at [facility], [place] [address] [place], [place] [address]    Report Status 03/09/2017 FINAL  Final  MRSA PCR Screening     Status: None   Collection Time: 03/08/17 10:30 AM  Result Value Ref Range Status   MRSA by PCR NEGATIVE NEGATIVE Final    Comment:        The GeneXpert MRSA Assay (FDA approved for NASAL specimens only), is one component of a comprehensive MRSA colonization surveillance program. It is not intended to diagnose MRSA infection nor to guide or monitor treatment for MRSA infections.   Aerobic Culture (superficial specimen)     Status: None   Collection Time: 03/08/17  6:46 PM  Result Value Ref Range Status   Specimen Description ABSCESS LEFT PECTORAL  Final   Special Requests NONE  Final   Gram Stain   Final    ABUNDANT WBC PRESENT, PREDOMINANTLY PMN ABUNDANT GRAM POSITIVE COCCI IN CLUSTERS Performed at [facility], [address] [place], [place] [address]    Culture   Final    ABUNDANT METHICILLIN RESISTANT STAPHYLOCOCCUS AUREUS   Report Status 03/11/2017 FINAL  Final   Organism ID, Bacteria METHICILLIN RESISTANT STAPHYLOCOCCUS AUREUS  Final      Susceptibility   Methicillin resistant staphylococcus aureus - MIC*    CIPROFLOXACIN <=0.5 SENSITIVE Sensitive     ERYTHROMYCIN >=8 RESISTANT Resistant     GENTAMICIN <=0.5 SENSITIVE Sensitive     OXACILLIN >=4 RESISTANT Resistant     TETRACYCLINE <=1 SENSITIVE Sensitive     VANCOMYCIN 1 SENSITIVE Sensitive  TRIMETH/SULFA <=10 SENSITIVE Sensitive     CLINDAMYCIN <=0.25 SENSITIVE Sensitive     RIFAMPIN <=0.5 SENSITIVE Sensitive     Inducible Clindamycin NEGATIVE Sensitive     * ABUNDANT METHICILLIN RESISTANT STAPHYLOCOCCUS AUREUS    Studies/Results: No results found.   Assessment/Plan: MRSA skin infection, abscess IVDA Therapeutic drug monitoring Hep C Ab+  Encouraged him to go to rehab, NA Spoke with CM Will continue vanco, zosyn for now Hopefully narrow soon (bactrim) vanco tr 8, appreciate pharm seeing.  Await Hep C studies.  HIV (-)  Total days of antibiotics: 4 vanco/zosyn         Bobby Rumpf MD, FACP Infectious Diseases (pager) [phone] [email or web address] 03/11/2017, 11:33 AM  LOS: 4 days

## 2017-03-11 NOTE — Progress Notes (Signed)
3 Days Post-Op   Subjective/Chief Complaint: Pt tol dressing changes well No acute issues overnight   Objective: Vital signs in last 24 hours: Temp:  [98.4 F (36.9 C)-98.7 F (37.1 C)] 98.7 F (37.1 C) (10/22 0613) Pulse Rate:  [75-95] 81 (10/22 0613) Resp:  [18-20] 20 (10/22 7026) BP: (119-125)/(71-74) 125/72 (10/22 3785) SpO2:  [98 %-100 %] 100 % (10/22 8850) Last BM Date: 03/06/17  Intake/Output from previous day: 10/21 0701 - 10/22 0700 In: 3074.7 [P.O.:360; I.V.:2064.7; IV Piggyback:650] Out: 450 [Urine:450] Intake/Output this shift: No intake/output data recorded.  General appearance: alert and cooperative Chest wall: incisions soupy, no purulence, no surrounding erythema, some ttp.  Lab Results:   Recent Labs  03/09/17 0940 03/11/17 0424  WBC 16.1* 10.9*  HGB 11.7* 11.9*  HCT 33.1* 36.3*  PLT 339 387   BMET  Recent Labs  03/09/17 0940 03/11/17 0424  NA 136 139  K 4.4 4.1  CL 103 106  CO2 22 26  GLUCOSE 137* 126*  BUN 7 8  CREATININE 0.56* 0.51*  CALCIUM 8.0* 8.4*   PT/INR No results for input(s): LABPROT, INR in the last 72 hours. ABG No results for input(s): PHART, HCO3 in the last 72 hours.  Invalid input(s): PCO2, PO2  Studies/Results: No results found.  Anti-infectives: Anti-infectives    Start     Dose/Rate Route Frequency Ordered Stop   03/10/17 2000  vancomycin (VANCOCIN) 1,250 mg in sodium chloride 0.9 % 250 mL IVPB     1,250 mg 166.7 mL/hr over 90 Minutes Intravenous Every 8 hours 03/10/17 1359     03/10/17 1030  piperacillin-tazobactam (ZOSYN) IVPB 3.375 g     3.375 g 12.5 mL/hr over 240 Minutes Intravenous Every 8 hours 03/10/17 1008     03/08/17 2230  piperacillin-tazobactam (ZOSYN) IVPB 3.375 g  Status:  Discontinued     3.375 g 12.5 mL/hr over 240 Minutes Intravenous Every 8 hours 03/08/17 2215 03/09/17 1633   03/08/17 1845  ceFAZolin (ANCEF) IVPB 2g/100 mL premix  Status:  Discontinued     2 g 200 mL/hr over 30  Minutes Intravenous  Once 03/08/17 1836 03/08/17 2029   03/08/17 1838  ceFAZolin (ANCEF) 2-4 GM/100ML-% IVPB    Comments:  [name]   : cabinet override      03/08/17 1838 03/08/17 1845   03/07/17 2000  vancomycin (VANCOCIN) IVPB 1000 mg/200 mL premix  Status:  Discontinued     1,000 mg 200 mL/hr over 60 Minutes Intravenous Every 8 hours 03/07/17 1346 03/10/17 1359   03/07/17 1800  vancomycin (VANCOCIN) IVPB 1000 mg/200 mL premix  Status:  Discontinued     1,000 mg 200 mL/hr over 60 Minutes Intravenous Every 8 hours 03/07/17 1047 03/07/17 1346   03/07/17 1100  vancomycin (VANCOCIN) 1,500 mg in sodium chloride 0.9 % 500 mL IVPB     1,500 mg 250 mL/hr over 120 Minutes Intravenous  Once 03/07/17 1044 03/07/17 1537      Assessment/Plan: s/p Procedure(s): IRRIGATION AND DEBRIDEMENT LEFT CHEST ABSCESS (N/A) Con't QID dressing changes.  Will order Dakins solution which should help tissue clean up quicker.  I do not think he needs further debridement at this time. WBC trending down.  Con't abx Mobilize as tol  LOS: 4 days    Rosario Jacks., Anne Hahn 03/11/2017

## 2017-03-12 DIAGNOSIS — F1990 Other psychoactive substance use, unspecified, uncomplicated: Secondary | ICD-10-CM

## 2017-03-12 DIAGNOSIS — F17210 Nicotine dependence, cigarettes, uncomplicated: Secondary | ICD-10-CM

## 2017-03-12 LAB — CULTURE, BLOOD (ROUTINE X 2)
Culture: NO GROWTH
SPECIAL REQUESTS: ADEQUATE

## 2017-03-12 LAB — HEPATITIS C VRS RNA DETECT BY PCR-QUAL: HEPATITIS C VRS RNA BY PCR-QUAL: NEGATIVE

## 2017-03-12 LAB — CREATININE, SERUM
Creatinine, Ser: 0.51 mg/dL — ABNORMAL LOW (ref 0.61–1.24)
GFR calc non Af Amer: >60 mL/min (ref >60)

## 2017-03-12 NOTE — Progress Notes (Signed)
[facility] Progress Note  4 Days Post-Op  Subjective: CC: wants to go home  Patient feeling much better. Pain in chest is still there but improved. Tolerating dressing changes.   Objective: Vital signs in last 24 hours: Temp:  [98.2 F (36.8 C)-99 F (37.2 C)] 98.8 F (37.1 C) (10/23 0501) Pulse Rate:  [78-97] 78 (10/23 0501) Resp:  [18-20] 18 (10/23 0501) BP: (118-124)/(70-86) 124/86 (10/23 0501) SpO2:  [98 %-100 %] 98 % (10/23 0501) Last BM Date: 03/06/17  Intake/Output from previous day: 10/22 0701 - 10/23 0700 In: 1080 [P.O.:1080] Out: 900 [Urine:900] Intake/Output this shift: No intake/output data recorded.  PE: Gen:  Alert, NAD, pleasant Pulm:  Normal effort Skin: chest wall incisions clean, small amount serosanguinous drainage, no surrounding erythema, mild TTP Psych: A&Ox3   Lab Results:   Recent Labs  03/11/17 0424  WBC 10.9*  HGB 11.9*  HCT 36.3*  PLT 387   BMET  Recent Labs  03/11/17 0424 03/12/17 0342  NA 139  --   K 4.1  --   CL 106  --   CO2 26  --   GLUCOSE 126*  --   BUN 8  --   CREATININE 0.51* 0.51*  CALCIUM 8.4*  --    PT/INR No results for input(s): LABPROT, INR in the last 72 hours. CMP     Component Value Date/Time   NA 139 03/11/2017 0424   K 4.1 03/11/2017 0424   CL 106 03/11/2017 0424   CO2 26 03/11/2017 0424   GLUCOSE 126 (H) 03/11/2017 0424   BUN 8 03/11/2017 0424   CREATININE 0.51 (L) 03/12/2017 0342   CALCIUM 8.4 (L) 03/11/2017 0424   PROT 7.8 03/07/2017 1215   ALBUMIN 3.4 (L) 03/07/2017 1215   AST 16 03/07/2017 1215   ALT BLOOD 03/10/2017 0417   ALKPHOS 94 03/07/2017 1215   BILITOT 0.6 03/07/2017 1215   GFRNONAA >60 03/12/2017 0342   GFRAA >60 03/12/2017 0342   Lipase  No results found for: LIPASE     Studies/Results: No results found.  Anti-infectives: Anti-infectives    Start     Dose/Rate Route Frequency Ordered Stop   03/10/17 2000  vancomycin (VANCOCIN) 1,250 mg in sodium  chloride 0.9 % 250 mL IVPB     1,250 mg 166.7 mL/hr over 90 Minutes Intravenous Every 8 hours 03/10/17 1359     03/10/17 1030  piperacillin-tazobactam (ZOSYN) IVPB 3.375 g     3.375 g 12.5 mL/hr over 240 Minutes Intravenous Every 8 hours 03/10/17 1008     03/08/17 2230  piperacillin-tazobactam (ZOSYN) IVPB 3.375 g  Status:  Discontinued     3.375 g 12.5 mL/hr over 240 Minutes Intravenous Every 8 hours 03/08/17 2215 03/09/17 1633   03/08/17 1845  ceFAZolin (ANCEF) IVPB 2g/100 mL premix  Status:  Discontinued     2 g 200 mL/hr over 30 Minutes Intravenous  Once 03/08/17 1836 03/08/17 2029   03/08/17 1838  ceFAZolin (ANCEF) 2-4 GM/100ML-% IVPB    Comments:  [name]   : cabinet override      03/08/17 1838 03/08/17 1845   03/07/17 2000  vancomycin (VANCOCIN) IVPB 1000 mg/200 mL premix  Status:  Discontinued     1,000 mg 200 mL/hr over 60 Minutes Intravenous Every 8 hours 03/07/17 1346 03/10/17 1359   03/07/17 1800  vancomycin (VANCOCIN) IVPB 1000 mg/200 mL premix  Status:  Discontinued     1,000 mg 200 mL/hr over 60 Minutes Intravenous Every 8  hours 03/07/17 1047 03/07/17 1346   03/07/17 1100  vancomycin (VANCOCIN) 1,500 mg in sodium chloride 0.9 % 500 mL IVPB     1,500 mg 250 mL/hr over 120 Minutes Intravenous  Once 03/07/17 1044 03/07/17 1537       Assessment/Plan 1.  IRRIGATION AND DEBRIDEMENT LEFT CHEST ABSCESS - 03/08/2017 - Gross             Upper extremity doppler neg BL             WBC - 10.9 yesterday, recheck in AM             Zosyn and Vancomycin  No further debridement needed at this time             QID dressing changes - can stop Dakin's tomorrow and go to saline   WTD QID  Stable for discharge from a surgical standpoint. RN teaching for dressing   changes.   F/U in [place] clinic.   2.  MRSA 3.  IV drug abuse 4.  Smokes 5.  DVT prophylaxis - Lovenox  LOS: 5 days    Brigid Re , [facility] 03/12/2017, 10:21 AM Pager:  [phone] Consults: [phone] Mon-Fri 7:00 am-4:30 pm Sat-Sun 7:00 am-11:30 am

## 2017-03-12 NOTE — Progress Notes (Signed)
PROGRESS NOTE                                                                                                                                                                                                             Patient Demographics:    Joseph Nunez, is a 21 y.o. male, DOB - [birth year], EZM:[id number]  Admit date - 03/07/2017   Admitting Physician Albertine Patricia, MD  Outpatient Primary MD for the patient is Patient, No Pcp Per  LOS - 5   No chief complaint on file.      Brief Narrative    21 y.o. male, ith past medical history of polysubstance abuse, he reports heroin, tobacco, cocaine, IV drugs, patient was admitted 03/06/2017, for upper chest wall cellulitis with Pyomyositis, was started empirically on IV vancomycin and Azactam, he was smoking in his room, asking only for IV pain medication, he did sign AMA the same day, his blood cultures came back positive for MRSA bacteremia , I have called patient to come back for direct admission for treatment of his bacteremia,TEE 03/08/2017 with no evidence of endocarditis, surgery consulted regarding left chest wall abscess with plan for surgical intervention.   Subjective:    Joseph Nunez no fever, no SOB, no N/V or abdominal pain. Patient with improvement in chest wall pain and also with just minimal drainage now; CCS expressed no further debridement anticipated.   Assessment  & Plan :    Principal Problem:   Abscesses of parasternal chest wall s/p I&D 03/08/2017 Active Problems:   IVDU (intravenous drug user)   Pyomyositis   Cellulitis of pectoral region   Cocaine abuse ([place])   Tobacco abuse   Bacteremia   Abscess of right chest wall s/p I&D 03/08/2017   Sepsis due to MRSA bacteremia/left pectoralis muscle cellulitis, Pyomyositis and abscess - patient with fever, leukocytosis and tachycardia on admission (meeting sepsis criteria). -sepsis features resolving now, will  monitor -wound looking much better and per surgery no further debridement anticipated now. -will continue vancomycin only for another 24 hours; with plans to transition to bactrim DS BID at discharge (for 1 month at discharge) -patient s/p I&D (see below); continue wound care and follow surgery recommendations, reaching the point of being stable to discharge with further outpatient wound care.. -continue supportive care, PRN analgesics and IV antibiotics -TEE neg  for endocarditis  Left Upper chest wall cellulitis Tresa Garter -Likely due to seeding from bacteremia, continue with IV antibiotics. -patient s/p extensive I&D on 10/19 -ID on board, will follow rec's -continue IV antibiotics for another 24 hours; then the plan is to discharge on bactrim DS BID and treat for 1 months. -continue wound care -continue PRN analgesics; will transition to PO only.  Polysubstance abuse/IV drug abuse -cessation counseling provided again  -patient was receptive and said he is done using drugs -no active signs of withdrawal appreciated -will continue ativan and atarax PRN -SW involved to provide outpatient resources to assist him quitting.   Tobacco abuse -cessation counseling once again encouraged -continue nicotine patch while inpatient  53mm left upper lobe subpleural nodule: -Patient has this incidental finding on CT of the chest -will need non contrast CT chest in 12 months for follow up. -no SOB, no weight loss -had hx of smoking   Loose stools -Patient C. Diff negative -will continue Florastor twice a day -no nausea, no vomiting, no abd pain  Code Status : Full  Family Communication : no family at bedside   Disposition Plan  : Home when stable  Consults  : surgery, ID  Procedures  : TEE 03/08/2017: no vegetations seen.  DVT Prophylaxis  :  Lovenox  Lab Results  Component Value Date   PLT 387 03/11/2017    Antibiotics  :    Anti-infectives    Start     Dose/Rate Route  Frequency Ordered Stop   03/10/17 2000  vancomycin (VANCOCIN) 1,250 mg in sodium chloride 0.9 % 250 mL IVPB     1,250 mg 166.7 mL/hr over 90 Minutes Intravenous Every 8 hours 03/10/17 1359     03/10/17 1030  piperacillin-tazobactam (ZOSYN) IVPB 3.375 g  Status:  Discontinued     3.375 g 12.5 mL/hr over 240 Minutes Intravenous Every 8 hours 03/10/17 1008 03/12/17 1117   03/08/17 2230  piperacillin-tazobactam (ZOSYN) IVPB 3.375 g  Status:  Discontinued     3.375 g 12.5 mL/hr over 240 Minutes Intravenous Every 8 hours 03/08/17 2215 03/09/17 1633   03/08/17 1845  ceFAZolin (ANCEF) IVPB 2g/100 mL premix  Status:  Discontinued     2 g 200 mL/hr over 30 Minutes Intravenous  Once 03/08/17 1836 03/08/17 2029   03/08/17 1838  ceFAZolin (ANCEF) 2-4 GM/100ML-% IVPB    Comments:  Mentley, Pamela   : cabinet override      03/08/17 1838 03/08/17 1845   03/07/17 2000  vancomycin (VANCOCIN) IVPB 1000 mg/200 mL premix  Status:  Discontinued     1,000 mg 200 mL/hr over 60 Minutes Intravenous Every 8 hours 03/07/17 1346 03/10/17 1359   03/07/17 1800  vancomycin (VANCOCIN) IVPB 1000 mg/200 mL premix  Status:  Discontinued     1,000 mg 200 mL/hr over 60 Minutes Intravenous Every 8 hours 03/07/17 1047 03/07/17 1346   03/07/17 1100  vancomycin (VANCOCIN) 1,500 mg in sodium chloride 0.9 % 500 mL IVPB     1,500 mg 250 mL/hr over 120 Minutes Intravenous  Once 03/07/17 1044 03/07/17 1537        Objective:   Vitals:   03/11/17 0613 03/11/17 1609 03/11/17 2109 03/12/17 0501  BP: 125/72 120/70 118/82 124/86  Pulse: 81 97 78 78  Resp: 20 20 18 18   Temp: 98.7 F (37.1 C) 99 F (37.2 C) 98.2 F (36.8 C) 98.8 F (37.1 C)  TempSrc: Oral Oral Oral Oral  SpO2: 100% 100% 100% 98%  Weight:      Height:        Wt Readings from Last 3 Encounters:  03/08/17 61.2 kg (135 lb)  03/06/17 65.8 kg (145 lb)    Intake/Output Summary (Last 24 hours) at 03/12/17 1501 Last data filed at 03/12/17 0502  Gross per 24  hour  Intake              840 ml  Output              900 ml  Net              -60 ml    Physical Exam GEN: remains afebrile, no nausea, no vomiting, no abd pain. Patient still with pain in his chest but improved. Neg C. Diff  Cardiovascular: no rubs, no gallops, no murmurs, RRR  Resp: CTA bilaterally  Abd: soft, NT, ND, positive BS.   Extremities: no edema, no cyanosis    Skin: significant improvement in his wound with use of dakin's solution and current wound care; minimal discharge and more healthy tissue appreciated. Pain still present on palpation, but much improved. No surrounding erythema.     Data Review:    CBC  Recent Labs Lab 03/05/17 2015 03/07/17 1215 03/08/17 0417 03/09/17 0940 03/11/17 0424  WBC 15.8* 20.1* 17.8* 16.1* 10.9*  HGB 12.9* 12.4* 12.0* 11.7* 11.9*  HCT 38.1* 36.1* 36.4* 33.1* 36.3*  PLT 237 327 287 339 387  MCV 86.4 86.4 86.5 83.8 87.9  MCH 29.3 29.7 28.5 29.6 28.8  MCHC 33.9 34.3 33.0 35.3 32.8  RDW 12.7 12.9 13.2 13.3 13.4  LYMPHSABS 1.4  --   --   --   --   MONOABS 1.2*  --   --   --   --   EOSABS 0.1  --   --   --   --   BASOSABS 0.0  --   --   --   --     Chemistries   Recent Labs Lab 03/05/17 2015 03/06/17 0515 03/07/17 1215 03/08/17 0417 03/09/17 0940 03/10/17 0417 03/11/17 0424 03/12/17 0342  NA 133* 135 138 135 136  --  139  --   K 4.2 4.3 3.8 4.7 4.4  --  4.1  --   CL 96* 102 99* 99* 103  --  106  --   CO2 24 24 28 26 22   --  26  --   GLUCOSE 154* 125* 121* 130* 137*  --  126*  --   BUN 6 <5* 6 6 7   --  8  --   CREATININE 0.65 0.60* 0.60* 0.57* 0.56*  --  0.51* 0.51*  CALCIUM 8.6* 8.3* 9.0 8.5* 8.0*  --  8.4*  --   AST 13*  --  16  --   --   --   --   --   ALT 12*  --  13*  --   --  BLOOD  --   --   ALKPHOS 85  --  94  --   --   --   --   --   BILITOT 0.5  --  0.6  --   --   --   --   --    Inpatient Medications  Scheduled Meds: . acetaminophen  1,000 mg Oral TID  . enoxaparin (LOVENOX) injection  40 mg  Subcutaneous Q24H  . ibuprofen  800 mg Oral QID  . lip balm  1 application Topical BID  .  nicotine  21 mg Transdermal Daily  . pantoprazole  40 mg Oral Q1200  . polyethylene glycol  17 g Oral Daily  . saccharomyces boulardii  250 mg Oral BID  . sodium chloride flush  3 mL Intravenous Q12H  . sodium hypochlorite   Irrigation QID   Continuous Infusions: . sodium chloride 100 mL/hr at 03/12/17 0849  . chlorproMAZINE (THORAZINE) IV    . methocarbamol (ROBAXIN)  IV    . vancomycin 1,250 mg (03/12/17 1253)   PRN Meds:.acetaminophen **OR** acetaminophen, alum & mag hydroxide-simeth, bisacodyl, chlorproMAZINE (THORAZINE) IV, diphenhydrAMINE, guaiFENesin-dextromethorphan, hydrocortisone, hydrocortisone cream, hydrOXYzine, LORazepam, magic mouthwash, menthol-cetylpyridinium, methocarbamol (ROBAXIN)  IV, methocarbamol, metoCLOPramide (REGLAN) injection, morphine injection, oxyCODONE, phenol, polyethylene glycol, prochlorperazine  Micro Results Recent Results (from the past 240 hour(s))  Culture, blood (routine x 2)     Status: Abnormal   Collection Time: 03/06/17  1:55 AM  Result Value Ref Range Status   Specimen Description BLOOD LEFT ANTECUBITAL  Final   Special Requests   Final    BOTTLES DRAWN AEROBIC AND ANAEROBIC Blood Culture adequate volume   Culture  Setup Time   Final    GRAM POSITIVE COCCI IN CLUSTERS IN BOTH AEROBIC AND ANAEROBIC BOTTLES CRITICAL RESULT CALLED TO, READ BACK BY AND VERIFIED WITH: DR. [facility], AT 0715 03/07/17 BY Rush Landmark Performed at [facility], [address], [place], [place] [address]    Culture METHICILLIN RESISTANT STAPHYLOCOCCUS AUREUS (A)  Final   Report Status 03/08/2017 FINAL  Final   Organism ID, Bacteria METHICILLIN RESISTANT STAPHYLOCOCCUS AUREUS  Final      Susceptibility   Methicillin resistant staphylococcus aureus - MIC*    CIPROFLOXACIN <=0.5 SENSITIVE Sensitive     ERYTHROMYCIN >=8 RESISTANT Resistant     GENTAMICIN <=0.5 SENSITIVE  Sensitive     OXACILLIN >=4 RESISTANT Resistant     TETRACYCLINE <=1 SENSITIVE Sensitive     VANCOMYCIN <=0.5 SENSITIVE Sensitive     TRIMETH/SULFA <=10 SENSITIVE Sensitive     CLINDAMYCIN <=0.25 SENSITIVE Sensitive     RIFAMPIN <=0.5 SENSITIVE Sensitive     Inducible Clindamycin NEGATIVE Sensitive     * METHICILLIN RESISTANT STAPHYLOCOCCUS AUREUS  Blood Culture ID Panel (Reflexed)     Status: Abnormal   Collection Time: 03/06/17  1:55 AM  Result Value Ref Range Status   Enterococcus species NOT DETECTED NOT DETECTED Final   Listeria monocytogenes NOT DETECTED NOT DETECTED Final   Staphylococcus species DETECTED (A) NOT DETECTED Final    Comment: CRITICAL RESULT CALLED TO, READ BACK BY AND VERIFIED WITH: DR. [facility], AT 0715 03/07/17 BY D. VANHOOK    Staphylococcus aureus DETECTED (A) NOT DETECTED Final    Comment: Methicillin (oxacillin)-resistant Staphylococcus aureus (MRSA). MRSA is predictably resistant to beta-lactam antibiotics (except ceftaroline). Preferred therapy is vancomycin unless clinically contraindicated. Patient requires contact precautions if  hospitalized. CRITICAL RESULT CALLED TO, READ BACK BY AND VERIFIED WITH: DR. [facility], AT 0715 03/07/17 BY D. VANHOOK    Methicillin resistance DETECTED (A) NOT DETECTED Final    Comment: CRITICAL RESULT CALLED TO, READ BACK BY AND VERIFIED WITH: DR. [facility], AT 0715 03/07/17 BY D. VANHOOK    Streptococcus species NOT DETECTED NOT DETECTED Final   Streptococcus agalactiae NOT DETECTED NOT DETECTED Final   Streptococcus pneumoniae NOT DETECTED NOT DETECTED Final   Streptococcus pyogenes NOT DETECTED NOT DETECTED Final   Acinetobacter baumannii NOT DETECTED NOT DETECTED Final   Enterobacteriaceae species NOT DETECTED NOT DETECTED Final   Enterobacter cloacae  complex NOT DETECTED NOT DETECTED Final   Escherichia coli NOT DETECTED NOT DETECTED Final   Klebsiella oxytoca NOT DETECTED NOT DETECTED Final   Klebsiella  pneumoniae NOT DETECTED NOT DETECTED Final   Proteus species NOT DETECTED NOT DETECTED Final   Serratia marcescens NOT DETECTED NOT DETECTED Final   Haemophilus influenzae NOT DETECTED NOT DETECTED Final   Neisseria meningitidis NOT DETECTED NOT DETECTED Final   Pseudomonas aeruginosa NOT DETECTED NOT DETECTED Final   Candida albicans NOT DETECTED NOT DETECTED Final   Candida glabrata NOT DETECTED NOT DETECTED Final   Candida krusei NOT DETECTED NOT DETECTED Final   Candida parapsilosis NOT DETECTED NOT DETECTED Final   Candida tropicalis NOT DETECTED NOT DETECTED Final    Comment: Performed at [facility], [place] [address], [place], [place] [address]  Culture, blood (routine x 2)     Status: None   Collection Time: 03/06/17  2:51 PM  Result Value Ref Range Status   Specimen Description BLOOD LEFT ARM  Final   Special Requests IN PEDIATRIC BOTTLE Blood Culture adequate volume  Final   Culture   Final    NO GROWTH 5 DAYS Performed at [facility], [place] [address], [place], [place] [address]    Report Status 03/11/2017 FINAL  Final  Culture, blood (Routine X 2) w Reflex to ID Panel     Status: None   Collection Time: 03/07/17 12:15 PM  Result Value Ref Range Status   Specimen Description BLOOD LEFT ARM  Final   Special Requests   Final    BOTTLES DRAWN AEROBIC ONLY Blood Culture adequate volume   Culture   Final    NO GROWTH 5 DAYS Performed at [facility], [place] [address] [place], [place] [address]    Report Status 03/12/2017 FINAL  Final  Culture, blood (Routine X 2) w Reflex to ID Panel     Status: Abnormal   Collection Time: 03/07/17 12:15 PM  Result Value Ref Range Status   Specimen Description BLOOD RIGHT ANTECUBITAL  Final   Special Requests IN PEDIATRIC BOTTLE Blood Culture adequate volume  Final   Culture  Setup Time   Final    GRAM POSITIVE COCCI IN CLUSTERS IN PEDIATRIC BOTTLE CRITICAL RESULT CALLED TO, READ BACK BY AND VERIFIED WITH: N. [place]  PHARMD, AT [place] 03/08/17 BY D. VANHOOK    Culture (A)  Final    STAPHYLOCOCCUS AUREUS SUSCEPTIBILITIES PERFORMED ON PREVIOUS CULTURE WITHIN THE LAST 5 DAYS. Performed at [facility], [place] [address], [place], [place] [address]    Report Status 03/09/2017 FINAL  Final  MRSA PCR Screening     Status: None   Collection Time: 03/08/17 10:30 AM  Result Value Ref Range Status   MRSA by PCR NEGATIVE NEGATIVE Final    Comment:        The GeneXpert MRSA Assay (FDA approved for NASAL specimens only), is one component of a comprehensive MRSA colonization surveillance program. It is not intended to diagnose MRSA infection nor to guide or monitor treatment for MRSA infections.   Aerobic Culture (superficial specimen)     Status: None   Collection Time: 03/08/17  6:46 PM  Result Value Ref Range Status   Specimen Description ABSCESS LEFT PECTORAL  Final   Special Requests NONE  Final   Gram Stain   Final    ABUNDANT WBC PRESENT, PREDOMINANTLY PMN ABUNDANT GRAM POSITIVE COCCI IN CLUSTERS Performed at [facility],  [address], [place], [place] [address]    Culture   Final    ABUNDANT METHICILLIN RESISTANT STAPHYLOCOCCUS AUREUS   Report Status 03/11/2017 FINAL  Final   Organism ID, Bacteria METHICILLIN RESISTANT STAPHYLOCOCCUS AUREUS  Final      Susceptibility   Methicillin resistant staphylococcus aureus - MIC*    CIPROFLOXACIN <=0.5 SENSITIVE Sensitive     ERYTHROMYCIN >=8 RESISTANT Resistant     GENTAMICIN <=0.5 SENSITIVE Sensitive     OXACILLIN >=4 RESISTANT Resistant     TETRACYCLINE <=1 SENSITIVE Sensitive     VANCOMYCIN 1 SENSITIVE Sensitive     TRIMETH/SULFA <=10 SENSITIVE Sensitive     CLINDAMYCIN <=0.25 SENSITIVE Sensitive     RIFAMPIN <=0.5 SENSITIVE Sensitive     Inducible Clindamycin NEGATIVE Sensitive     * ABUNDANT METHICILLIN RESISTANT STAPHYLOCOCCUS AUREUS  C difficile quick scan w PCR reflex     Status: None   Collection Time: 03/11/17 12:50 PM    Result Value Ref Range Status   C Diff antigen NEGATIVE NEGATIVE Final   C Diff toxin NEGATIVE NEGATIVE Final   C Diff interpretation No C. difficile detected.  Final    Radiology Reports Dg Chest 2 View  Result Date: 03/05/2017 CLINICAL DATA:  Chest pain EXAM: CHEST  2 VIEW COMPARISON:  None. FINDINGS: Lungs are clear. Heart size and pulmonary vascularity are normal. No adenopathy. No pneumothorax. No bone lesions. IMPRESSION: No edema or consolidation. Electronically Signed   By: Lowella Grip III M.D.   On: 03/05/2017 19:09   Dg Sternum  Result Date: 03/05/2017 CLINICAL DATA:  Pain after twisting injury.  Heroin user. EXAM: STERNUM - 1+ VIEW COMPARISON:  Chest radiograph of earlier today. FINDINGS: Single lateral view of the sternum demonstrates no focal osseous abnormality. No retrosternal soft tissue mass. No subcutaneous gas or osseous destruction. IMPRESSION: No acute osseous abnormality. Electronically Signed   By: Abigail Miyamoto M.D.   On: 03/05/2017 19:28   Ct Chest W Contrast  Result Date: 03/06/2017 CLINICAL DATA:  Patient developed pectoral pain on the right after throwing an orange. Leukocytosis. EXAM: CT CHEST WITH CONTRAST TECHNIQUE: Multidetector CT imaging of the chest was performed during intravenous contrast administration. CONTRAST:  75 cc Isovue-300 COMPARISON:  None. FINDINGS: Cardiovascular: Normal heart size without pericardial effusion. Normal ao aortic caliber. No large central pulmonary embolus though the study is not tailored for assessment of pulmonary emboli. Mediastinum/Nodes: No enlarged mediastinal, hilar, or axillary lymph nodes. Thyroid gland, trachea, and esophagus demonstrate no significant findings. Lungs/Pleura: 4 mm left upper lobe subpleural nodule. No pneumonic consolidation, effusion or pneumothorax. Upper Abdomen: No acute abnormality. Musculoskeletal: Asymmetric enlargement with subtle intramuscular heterogeneous enhancement of the left pectoralis  muscle relative to right. Findings are suspicious for pyomyositis. No intramuscular hemorrhage. There is overlying soft tissue induration and inflammation though the anterior chest wall extending to the upper abdomen. IMPRESSION: 1. Asymmetric enlargement of the left pectoralis muscle relative to right. There is subtle intramuscular enhancement suspicious for a developing pyomyositis of the left pectoralis. Overlying subcutaneous fatty induration is seen of the anterior chest wall that may reflect a cellulitis. These results were called by telephone at the time of interpretation on 03/06/2017 at 1:05 am to [place], who verbally acknowledged these results. 2. 4 mm left upper lobe subpleural nodule. No follow-up needed if patient is low-risk. Non-contrast chest CT can be considered in 12 months if patient is high-risk. This recommendation follows the consensus statement: Guidelines for Management of  Incidental Pulmonary Nodules Detected on CT Images: From the Fleischner Society 2017; Radiology 2017; 284:228-243. Electronically Signed   By: Ashley Royalty M.D.   On: 03/06/2017 01:06    Time Spent in minutes  25 minutes   Barton Dubois M.D on 03/12/2017 at 3:01 PM  Between 7am to 7pm - Pager - [phone]  After 7pm go to [email or web address] - password [facility]  [facility] -  Office  [phone]

## 2017-03-12 NOTE — Progress Notes (Signed)
INFECTIOUS DISEASE PROGRESS NOTE  ID: Joseph Nunez is a 21 y.o. male with  Principal Problem:   Abscesses of parasternal chest wall s/p I&D 03/08/2017 Active Problems:   IVDU (intravenous drug user)   Pyomyositis   Cellulitis of pectoral region   Cocaine abuse ([place])   Tobacco abuse   Bacteremia   Abscess of right chest wall s/p I&D 03/08/2017  Subjective: Wants to go home  Abtx:  Anti-infectives    Start     Dose/Rate Route Frequency Ordered Stop   03/10/17 2000  vancomycin (VANCOCIN) 1,250 mg in sodium chloride 0.9 % 250 mL IVPB     1,250 mg 166.7 mL/hr over 90 Minutes Intravenous Every 8 hours 03/10/17 1359     03/10/17 1030  piperacillin-tazobactam (ZOSYN) IVPB 3.375 g     3.375 g 12.5 mL/hr over 240 Minutes Intravenous Every 8 hours 03/10/17 1008     03/08/17 2230  piperacillin-tazobactam (ZOSYN) IVPB 3.375 g  Status:  Discontinued     3.375 g 12.5 mL/hr over 240 Minutes Intravenous Every 8 hours 03/08/17 2215 03/09/17 1633   03/08/17 1845  ceFAZolin (ANCEF) IVPB 2g/100 mL premix  Status:  Discontinued     2 g 200 mL/hr over 30 Minutes Intravenous  Once 03/08/17 1836 03/08/17 2029   03/08/17 1838  ceFAZolin (ANCEF) 2-4 GM/100ML-% IVPB    Comments:  Mentley, Pamela   : cabinet override      03/08/17 1838 03/08/17 1845   03/07/17 2000  vancomycin (VANCOCIN) IVPB 1000 mg/200 mL premix  Status:  Discontinued     1,000 mg 200 mL/hr over 60 Minutes Intravenous Every 8 hours 03/07/17 1346 03/10/17 1359   03/07/17 1800  vancomycin (VANCOCIN) IVPB 1000 mg/200 mL premix  Status:  Discontinued     1,000 mg 200 mL/hr over 60 Minutes Intravenous Every 8 hours 03/07/17 1047 03/07/17 1346   03/07/17 1100  vancomycin (VANCOCIN) 1,500 mg in sodium chloride 0.9 % 500 mL IVPB     1,500 mg 250 mL/hr over 120 Minutes Intravenous  Once 03/07/17 1044 03/07/17 1537      Medications:  Scheduled: . acetaminophen  1,000 mg Oral TID  . enoxaparin (LOVENOX) injection  40 mg  Subcutaneous Q24H  . ibuprofen  800 mg Oral QID  . lip balm  1 application Topical BID  . nicotine  21 mg Transdermal Daily  . pantoprazole  40 mg Oral Q1200  . polyethylene glycol  17 g Oral Daily  . saccharomyces boulardii  250 mg Oral BID  . sodium chloride flush  3 mL Intravenous Q12H  . sodium hypochlorite   Irrigation QID    Objective: Vital signs in last 24 hours: Temp:  [98.2 F (36.8 C)-99 F (37.2 C)] 98.8 F (37.1 C) (10/23 0501) Pulse Rate:  [78-97] 78 (10/23 0501) Resp:  [18-20] 18 (10/23 0501) BP: (118-124)/(70-86) 124/86 (10/23 0501) SpO2:  [98 %-100 %] 98 % (10/23 0501)   General appearance: alert, cooperative and no distress Resp: clear to auscultation bilaterally Chest wall: no tenderness, wounds packed, clean  Cardio: regular rate and rhythm GI: normal findings: bowel sounds normal and soft, non-tender  Lab Results  Recent Labs  03/11/17 0424 03/12/17 0342  WBC 10.9*  --   HGB 11.9*  --   HCT 36.3*  --   NA 139  --   K 4.1  --   CL 106  --   CO2 26  --   BUN 8  --  CREATININE 0.51* 0.51*   Liver Panel  Recent Labs  03/10/17 0417  ALT BLOOD   Sedimentation Rate No results for input(s): ESRSEDRATE in the last 72 hours. C-Reactive Protein No results for input(s): CRP in the last 72 hours.  Microbiology: Recent Results (from the past 240 hour(s))  Culture, blood (routine x 2)     Status: Abnormal   Collection Time: 03/06/17  1:55 AM  Result Value Ref Range Status   Specimen Description BLOOD LEFT ANTECUBITAL  Final   Special Requests   Final    BOTTLES DRAWN AEROBIC AND ANAEROBIC Blood Culture adequate volume   Culture  Setup Time   Final    GRAM POSITIVE COCCI IN CLUSTERS IN BOTH AEROBIC AND ANAEROBIC BOTTLES CRITICAL RESULT CALLED TO, READ BACK BY AND VERIFIED WITH: DR. [facility], AT 0715 03/07/17 BY Rush Landmark Performed at [facility], [place] [address], [place], [place] [address]    Culture METHICILLIN RESISTANT  STAPHYLOCOCCUS AUREUS (A)  Final   Report Status 03/08/2017 FINAL  Final   Organism ID, Bacteria METHICILLIN RESISTANT STAPHYLOCOCCUS AUREUS  Final      Susceptibility   Methicillin resistant staphylococcus aureus - MIC*    CIPROFLOXACIN <=0.5 SENSITIVE Sensitive     ERYTHROMYCIN >=8 RESISTANT Resistant     GENTAMICIN <=0.5 SENSITIVE Sensitive     OXACILLIN >=4 RESISTANT Resistant     TETRACYCLINE <=1 SENSITIVE Sensitive     VANCOMYCIN <=0.5 SENSITIVE Sensitive     TRIMETH/SULFA <=10 SENSITIVE Sensitive     CLINDAMYCIN <=0.25 SENSITIVE Sensitive     RIFAMPIN <=0.5 SENSITIVE Sensitive     Inducible Clindamycin NEGATIVE Sensitive     * METHICILLIN RESISTANT STAPHYLOCOCCUS AUREUS  Blood Culture ID Panel (Reflexed)     Status: Abnormal   Collection Time: 03/06/17  1:55 AM  Result Value Ref Range Status   Enterococcus species NOT DETECTED NOT DETECTED Final   Listeria monocytogenes NOT DETECTED NOT DETECTED Final   Staphylococcus species DETECTED (A) NOT DETECTED Final    Comment: CRITICAL RESULT CALLED TO, READ BACK BY AND VERIFIED WITH: DR. [facility], AT 0715 03/07/17 BY D. VANHOOK    Staphylococcus aureus DETECTED (A) NOT DETECTED Final    Comment: Methicillin (oxacillin)-resistant Staphylococcus aureus (MRSA). MRSA is predictably resistant to beta-lactam antibiotics (except ceftaroline). Preferred therapy is vancomycin unless clinically contraindicated. Patient requires contact precautions if  hospitalized. CRITICAL RESULT CALLED TO, READ BACK BY AND VERIFIED WITH: DR. [facility], AT 0715 03/07/17 BY D. VANHOOK    Methicillin resistance DETECTED (A) NOT DETECTED Final    Comment: CRITICAL RESULT CALLED TO, READ BACK BY AND VERIFIED WITH: DR. [facility], AT 0715 03/07/17 BY D. VANHOOK    Streptococcus species NOT DETECTED NOT DETECTED Final   Streptococcus agalactiae NOT DETECTED NOT DETECTED Final   Streptococcus pneumoniae NOT DETECTED NOT DETECTED Final   Streptococcus pyogenes  NOT DETECTED NOT DETECTED Final   Acinetobacter baumannii NOT DETECTED NOT DETECTED Final   Enterobacteriaceae species NOT DETECTED NOT DETECTED Final   Enterobacter cloacae complex NOT DETECTED NOT DETECTED Final   Escherichia coli NOT DETECTED NOT DETECTED Final   Klebsiella oxytoca NOT DETECTED NOT DETECTED Final   Klebsiella pneumoniae NOT DETECTED NOT DETECTED Final   Proteus species NOT DETECTED NOT DETECTED Final   Serratia marcescens NOT DETECTED NOT DETECTED Final   Haemophilus influenzae NOT DETECTED NOT DETECTED Final   Neisseria meningitidis NOT DETECTED NOT DETECTED Final   Pseudomonas aeruginosa NOT DETECTED NOT DETECTED Final   Candida  albicans NOT DETECTED NOT DETECTED Final   Candida glabrata NOT DETECTED NOT DETECTED Final   Candida krusei NOT DETECTED NOT DETECTED Final   Candida parapsilosis NOT DETECTED NOT DETECTED Final   Candida tropicalis NOT DETECTED NOT DETECTED Final    Comment: Performed at [facility], [place] [address] [place], [place] [address]  Culture, blood (routine x 2)     Status: None   Collection Time: 03/06/17  2:51 PM  Result Value Ref Range Status   Specimen Description BLOOD LEFT ARM  Final   Special Requests IN PEDIATRIC BOTTLE Blood Culture adequate volume  Final   Culture   Final    NO GROWTH 5 DAYS Performed at [facility], [place] [address], [place] [address]    Report Status 03/11/2017 FINAL  Final  Culture, blood (Routine X 2) w Reflex to ID Panel     Status: None (Preliminary result)   Collection Time: 03/07/17 12:15 PM  Result Value Ref Range Status   Specimen Description BLOOD LEFT ARM  Final   Special Requests   Final    BOTTLES DRAWN AEROBIC ONLY Blood Culture adequate volume   Culture   Final    NO GROWTH 4 DAYS Performed at [facility], [place] [address] [place], [place] [address]    Report Status PENDING  Incomplete  Culture, blood (Routine X 2) w Reflex to ID Panel     Status: Abnormal    Collection Time: 03/07/17 12:15 PM  Result Value Ref Range Status   Specimen Description BLOOD RIGHT ANTECUBITAL  Final   Special Requests IN PEDIATRIC BOTTLE Blood Culture adequate volume  Final   Culture  Setup Time   Final    GRAM POSITIVE COCCI IN CLUSTERS IN PEDIATRIC BOTTLE CRITICAL RESULT CALLED TO, READ BACK BY AND VERIFIED WITH: N. [place] PHARMD, AT [place] 03/08/17 BY D. VANHOOK    Culture (A)  Final    STAPHYLOCOCCUS AUREUS SUSCEPTIBILITIES PERFORMED ON PREVIOUS CULTURE WITHIN THE LAST 5 DAYS. Performed at [facility], [place] [address], [place], [place] [address]    Report Status 03/09/2017 FINAL  Final  MRSA PCR Screening     Status: None   Collection Time: 03/08/17 10:30 AM  Result Value Ref Range Status   MRSA by PCR NEGATIVE NEGATIVE Final    Comment:        The GeneXpert MRSA Assay (FDA approved for NASAL specimens only), is one component of a comprehensive MRSA colonization surveillance program. It is not intended to diagnose MRSA infection nor to guide or monitor treatment for MRSA infections.   Aerobic Culture (superficial specimen)     Status: None   Collection Time: 03/08/17  6:46 PM  Result Value Ref Range Status   Specimen Description ABSCESS LEFT PECTORAL  Final   Special Requests NONE  Final   Gram Stain   Final    ABUNDANT WBC PRESENT, PREDOMINANTLY PMN ABUNDANT GRAM POSITIVE COCCI IN CLUSTERS Performed at [facility], [address], [place], [place] [address]    Culture   Final    ABUNDANT METHICILLIN RESISTANT STAPHYLOCOCCUS AUREUS   Report Status 03/11/2017 FINAL  Final   Organism ID, Bacteria METHICILLIN RESISTANT STAPHYLOCOCCUS AUREUS  Final      Susceptibility   Methicillin resistant staphylococcus aureus - MIC*    CIPROFLOXACIN <=0.5 SENSITIVE Sensitive     ERYTHROMYCIN >=8 RESISTANT Resistant     GENTAMICIN <=0.5 SENSITIVE Sensitive  OXACILLIN >=4 RESISTANT Resistant     TETRACYCLINE <=1 SENSITIVE Sensitive      VANCOMYCIN 1 SENSITIVE Sensitive     TRIMETH/SULFA <=10 SENSITIVE Sensitive     CLINDAMYCIN <=0.25 SENSITIVE Sensitive     RIFAMPIN <=0.5 SENSITIVE Sensitive     Inducible Clindamycin NEGATIVE Sensitive     * ABUNDANT METHICILLIN RESISTANT STAPHYLOCOCCUS AUREUS  C difficile quick scan w PCR reflex     Status: None   Collection Time: 03/11/17 12:50 PM  Result Value Ref Range Status   C Diff antigen NEGATIVE NEGATIVE Final   C Diff toxin NEGATIVE NEGATIVE Final   C Diff interpretation No C. difficile detected.  Final    Studies/Results: No results found.   Assessment/Plan: MRSA bacteremia  TEE (10-19) negative MRSA chest wall abscesses IVDA Hep C Ab+ (immune, RNA -) HIV (-)  Total days of antibiotics: 5 vanco/zosyn  Will change him to vanco alone Will repeat BCx (his prev were 1/2 +) Would change him to bactrim for 1 month when d/c.  I encouraged him to stop smoking.  I encouraged him to stay clean.  Available as needed.          Bobby Rumpf MD, FACP Infectious Diseases (pager) [phone] [email or web address] 03/12/2017, 11:13 AM  LOS: 5 days

## 2017-03-13 LAB — ANAEROBIC CULTURE

## 2017-03-13 LAB — CREATININE, SERUM
Creatinine, Ser: 0.54 mg/dL — ABNORMAL LOW (ref 0.61–1.24)
GFR calc Af Amer: >60 mL/min (ref >60)
GFR calc non Af Amer: >60 mL/min (ref >60)

## 2017-03-13 MED ORDER — SULFAMETHOXAZOLE-TRIMETHOPRIM 800-160 MG PO TABS: 1.0000 | ORAL_TABLET | Freq: Two times a day (BID) | ORAL | 0 refills | Status: DC

## 2017-03-13 MED ORDER — METHOCARBAMOL 500 MG PO TABS: 500.0000 mg | ORAL_TABLET | Freq: Four times a day (QID) | ORAL | 0 refills | Status: DC | PRN

## 2017-03-13 MED ORDER — NICOTINE 21 MG/24HR TD PT24: 21.0000 mg | MEDICATED_PATCH | Freq: Every day | TRANSDERMAL | 0 refills | Status: DC

## 2017-03-13 MED ORDER — PANTOPRAZOLE SODIUM 40 MG PO TBEC: 40.0000 mg | DELAYED_RELEASE_TABLET | Freq: Every day | ORAL | 1 refills | Status: DC

## 2017-03-13 MED ORDER — SULFAMETHOXAZOLE-TRIMETHOPRIM 800-160 MG PO TABS: 1.0000 | ORAL_TABLET | Freq: Two times a day (BID) | ORAL | 0 refills | Status: AC

## 2017-03-13 MED ORDER — SACCHAROMYCES BOULARDII 250 MG PO CAPS: 250.0000 mg | ORAL_CAPSULE | Freq: Two times a day (BID) | ORAL | 1 refills | Status: DC

## 2017-03-13 NOTE — Clinical Social Work Note (Signed)
Clinical Social Work Assessment  Patient Details  Name: Joseph Nunez MRN: [id number] Date of Birth: 1996-01-30  Date of referral:  03/13/17               Reason for consult:  Substance Use/ETOH Abuse                Permission sought to share information with:    Permission granted to share information::     Name::        Agency::     Relationship::     Contact Information:     Housing/Transportation Living arrangements for the past 2 months:  Apartment Source of Information:  Patient Patient Interpreter Needed:  None Criminal Activity/Legal Involvement Pertinent to Current Situation/Hospitalization:  No - Comment as needed Significant Relationships:  Friend, Parents Lives with:  Parents Do you feel safe going back to the place where you live?  Yes Need for family participation in patient care:  No (Coment)  Care giving concerns:  Pt from home where he lives with his mother. No caregiving concerns   Facilities manager / plan:  CSW consulted to assess drug use issues. Met with pt at bedside. Pt very engaged and happy "[place] I'm going home today." States he has been using IV heroin x 1 year and snorted it prior to that. States, "I'm ready to stop, I'm 21 yrs old and I have these 2 holes in my chest, it's not too late for me I've only been using for 1.5 years, I can turn it around." States he has peer support and is following up with [facility] b/c he wants methadone maintenance.  CSW processed early recovery challenges with pt and he acknowledges the barriers he will face and is motivated to achieve sobriety. Also wants to begin NA and CSW provided area list.  Plan: pt to DC today and is to follow up with [facility] for heroin use treatment.    Employment status:  Psychologist, counselling:  Medicaid In [place] PT Recommendations:  Not assessed at this time Information / Referral to community resources:  Outpatient Substance Abuse Treatment Options, Residential Substance Abuse  Treatment Options  Patient/Family's Response to care:  appreciative  Patient/Family's Understanding of and Emotional Response to Diagnosis, Current Treatment, and Prognosis:  Pt very engaged in care, emotionally: happy- CSW redirected him several times re subject at hand due to his excitement anticipating DC today. Did demonstrate understanding of his prognosis and plan  Emotional Assessment Appearance:  Appears stated age Attitude/Demeanor/Rapport:   (excited, pleasant) Affect (typically observed):  Happy Orientation:  Oriented to Self, Oriented to Place, Oriented to  Time, Oriented to Situation Alcohol / Substance use:  Not Applicable Psych involvement (Current and /or in the community):  No (Comment)  Discharge Needs  Concerns to be addressed:  No discharge needs identified Readmission within the last 30 days:  No Current discharge risk:  Substance Abuse Barriers to Discharge:  No Barriers Identified   Nila Nephew, LCSW 03/13/2017, 4:15 PM  [phone]

## 2017-03-13 NOTE — Progress Notes (Signed)
Pt is without PCP and has Medicaid. This CM spoke with pt at bedside and gave him resources on the [facility], [place]. Pt instructed to make a follow up appointment at one of those locations. Pt states that he will. Choice offered for [facility] for wound care and [facility] chosen. [employer] rep contacted for referral.  Marney Doctor RN,BSN,NCM [phone]

## 2017-03-13 NOTE — Discharge Instructions (Signed)
WOUND CARE  It is important that the wound be kept open.   -Keeping the skin edges apart will allow the wound to gradually heal from the base upwards.   - If the skin edges of the wound close too early, a new fluid pocket can form and infection can occur. -This is the reason to pack deeper wounds with gauze or ribbon -This is why drained wounds cannot be sewed closed right away  A healthy wound should form a lining of bright red "beefy" granulating tissue that will help shrink the wound and help the edges grow new skin into it.   -A little mucus / yellow discharge is normal (the body's natural way to try and form a scab) and should be gently washed off with soap and water with daily dressing changes.  -Green or foul smelling drainage implies bacterial colonization and can slow wound healing - a short course of antibiotic ointment (3-5 days) can help it clear up.  Call the doctor if it does not improve or worsens  -Avoid use of antibiotic ointments for more than a week as they can slow wound healing over time.    -Sometimes other wound care products will be used to reduce need for dressing changes and/or help clean up dirty wounds -Sometimes the surgeon needs to debride the wound in the office to remove dead or infected tissue out of the wound so it can heal more quickly and safely.    Change the dressing at least once a day -Wash the wound with mild soap and water gently every day.  It is good to shower or bathe the wound to help it clean out. -Use clean 4x4 gauze for medium/large wounds or ribbon plain NU-gauze for smaller wounds (it does not need to be sterile, just clean) -Keep the raw wound moist with a little saline or KY (saline) gel on the gauze.  -A dry wound will take longer to heal.  -Keep the skin dry around the wound to prevent breakdown and irritation. -Pack the wound down to the base -The goal is to keep the skin apart, not overpack the wound -Use a Q-tip or blunt-tipped kabob  stick toothpick to push the gauze down to the base in narrow or deep wounds   -Cover with a clean gauze and tape -paper or Medipore tape tend to be gentle on the skin -rotate the orientation of the tape to avoid repeated stress/trauma on the skin -using an ACE or Coban wrap on wounds on arms or legs can be used instead.  Complete all antibiotics through the entire prescription to help the infection heal and prevent new places of infection   Returning the see the surgeon is helpful to follow the healing process and help the wound close as fast as possible.   Substance use treatment: Address: [address] [place], [place] [address]  Hours:  Wednesday 6AM-5PM  Thursday 6AM-5PM  Friday 6AM-3PM  Saturday 6:30-9:15AM  Sunday 6:30-9:15AM  Monday 6AM-5PM  Tuesday 6AM-5PM   Phone: [phone]  NA meetings list: [email or web address]

## 2017-03-13 NOTE — Discharge Summary (Signed)
Physician Discharge Summary  Joseph Nunez MRN: [id number] DOB/AGE: [birth year] 20 y.o.  PCP: Patient, No Pcp Per   Admit date: 03/07/2017 Discharge date: 03/13/2017  Discharge Diagnoses:    Principal Problem:   Abscesses of parasternal chest wall s/p I&D 03/08/2017 Active Problems:   IVDU (intravenous drug user)   Pyomyositis   Cellulitis of pectoral region   Cocaine abuse ([place])   Tobacco abuse   Bacteremia   Abscess of right chest wall s/p I&D 03/08/2017    Follow-up recommendations Follow-up with PCP in 3-5 days , including all  additional recommended appointments as below Follow-up CBC, CMP in 3-5 days Follow-up with general surgery as scheduled      Current Discharge Medication List    START taking these medications   Details  methocarbamol (ROBAXIN) 500 MG tablet Take 1 tablet (500 mg total) by mouth every 6 (six) hours as needed for muscle spasms. Qty: 45 tablet, Refills: 0    nicotine (NICODERM CQ - DOSED IN MG/24 HOURS) 21 mg/24hr patch Place 1 patch (21 mg total) onto the skin daily. Qty: 28 patch, Refills: 0    pantoprazole (PROTONIX) 40 MG tablet Take 1 tablet (40 mg total) by mouth daily at 12 noon. Qty: 30 tablet, Refills: 1    saccharomyces boulardii (FLORASTOR) 250 MG capsule Take 1 capsule (250 mg total) by mouth 2 (two) times daily. Qty: 60 capsule, Refills: 1      CONTINUE these medications which have CHANGED   Details  sulfamethoxazole-trimethoprim (BACTRIM DS,SEPTRA DS) 800-160 MG tablet Take 1 tablet by mouth 2 (two) times daily. Qty: 20 tablet, Refills: 0      CONTINUE these medications which have NOT CHANGED   Details  ibuprofen (ADVIL,MOTRIN) 200 MG tablet Take 400 mg by mouth every 6 (six) hours as needed for fever, headache, mild pain, moderate pain or cramping.         Discharge Condition: *Stable   Discharge Instructions Get Medicines reviewed and adjusted: Please take all your medications with you for your next  visit with your Primary MD  Please request your Primary MD to go over all hospital tests and procedure/radiological results at the follow up, please ask your Primary MD to get all Hospital records sent to his/her office.  If you experience worsening of your admission symptoms, develop shortness of breath, life threatening emergency, suicidal or homicidal thoughts you must seek medical attention immediately by calling 911 or calling your MD immediately if symptoms less severe.  You must read complete instructions/literature along with all the possible adverse reactions/side effects for all the Medicines you take and that have been prescribed to you. Take any new Medicines after you have completely understood and accpet all the possible adverse reactions/side effects.   Do not drive when taking Pain medications.   Do not take more than prescribed Pain, Sleep and Anxiety Medications  Special Instructions: If you have smoked or chewed Tobacco in the last 2 yrs please stop smoking, stop any regular Alcohol and or any Recreational drug use.  Wear Seat belts while driving.  Please note  You were cared for by a hospitalist during your hospital stay. Once you are discharged, your primary care physician will handle any further medical issues. Please note that NO REFILLS for any discharge medications will be authorized once you are discharged, as it is imperative that you return to your primary care physician (or establish a relationship with a primary care physician if you do not have one) for your  aftercare needs so that they can reassess your need for medications and monitor your lab values.     No Known Allergies    Disposition: Home   Consults: Infectious disease    Significant Diagnostic Studies:  Dg Chest 2 View  Result Date: 03/05/2017 CLINICAL DATA:  Chest pain EXAM: CHEST  2 VIEW COMPARISON:  None. FINDINGS: Lungs are clear. Heart size and pulmonary vascularity are normal. No  adenopathy. No pneumothorax. No bone lesions. IMPRESSION: No edema or consolidation. Electronically Signed   By: Lowella Grip III M.D.   On: 03/05/2017 19:09   Dg Sternum  Result Date: 03/05/2017 CLINICAL DATA:  Pain after twisting injury.  Heroin user. EXAM: STERNUM - 1+ VIEW COMPARISON:  Chest radiograph of earlier today. FINDINGS: Single lateral view of the sternum demonstrates no focal osseous abnormality. No retrosternal soft tissue mass. No subcutaneous gas or osseous destruction. IMPRESSION: No acute osseous abnormality. Electronically Signed   By: Abigail Miyamoto M.D.   On: 03/05/2017 19:28   Ct Chest W Contrast  Result Date: 03/06/2017 CLINICAL DATA:  Patient developed pectoral pain on the right after throwing an orange. Leukocytosis. EXAM: CT CHEST WITH CONTRAST TECHNIQUE: Multidetector CT imaging of the chest was performed during intravenous contrast administration. CONTRAST:  75 cc Isovue-300 COMPARISON:  None. FINDINGS: Cardiovascular: Normal heart size without pericardial effusion. Normal ao aortic caliber. No large central pulmonary embolus though the study is not tailored for assessment of pulmonary emboli. Mediastinum/Nodes: No enlarged mediastinal, hilar, or axillary lymph nodes. Thyroid gland, trachea, and esophagus demonstrate no significant findings. Lungs/Pleura: 4 mm left upper lobe subpleural nodule. No pneumonic consolidation, effusion or pneumothorax. Upper Abdomen: No acute abnormality. Musculoskeletal: Asymmetric enlargement with subtle intramuscular heterogeneous enhancement of the left pectoralis muscle relative to right. Findings are suspicious for pyomyositis. No intramuscular hemorrhage. There is overlying soft tissue induration and inflammation though the anterior chest wall extending to the upper abdomen. IMPRESSION: 1. Asymmetric enlargement of the left pectoralis muscle relative to right. There is subtle intramuscular enhancement suspicious for a developing  pyomyositis of the left pectoralis. Overlying subcutaneous fatty induration is seen of the anterior chest wall that may reflect a cellulitis. These results were called by telephone at the time of interpretation on 03/06/2017 at 1:05 am to [place], who verbally acknowledged these results. 2. 4 mm left upper lobe subpleural nodule. No follow-up needed if patient is low-risk. Non-contrast chest CT can be considered in 12 months if patient is high-risk. This recommendation follows the consensus statement: Guidelines for Management of Incidental Pulmonary Nodules Detected on CT Images: From the Fleischner Society 2017; Radiology 2017; 284:228-243. Electronically Signed   By: Ashley Royalty M.D.   On: 03/06/2017 01:06    echocardiogram  LV EF: 60% -   65%  ------------------------------------------------------------------- Indications:      790.7 Bacteremia.  ------------------------------------------------------------------- History:   PMH:  IV drug abuse.  ------------------------------------------------------------------- Study Conclusions  - Left ventricle: Systolic function was normal. The estimated   ejection fraction was in the range of 60% to 65%. Wall motion was   normal; there were no regional wall motion abnormalities. - Left atrium: No evidence of thrombus in the atrial cavity or   appendage. No evidence of thrombus in the atrial cavity or   appendage. - Right atrium: No evidence of thrombus in the atrial cavity or   appendage. - Atrial septum: No defect or patent foramen ovale was identified   by color flow Doppler.  Impressions:  - No  evidence of endocarditis      Filed Weights   03/07/17 1026 03/08/17 1117  Weight: 61.2 kg (135 lb) 61.2 kg (135 lb)     Microbiology: Recent Results (from the past 240 hour(s))  Culture, blood (routine x 2)     Status: Abnormal   Collection Time: 03/06/17  1:55 AM  Result Value Ref Range Status   Specimen Description  BLOOD LEFT ANTECUBITAL  Final   Special Requests   Final    BOTTLES DRAWN AEROBIC AND ANAEROBIC Blood Culture adequate volume   Culture  Setup Time   Final    GRAM POSITIVE COCCI IN CLUSTERS IN BOTH AEROBIC AND ANAEROBIC BOTTLES CRITICAL RESULT CALLED TO, READ BACK BY AND VERIFIED WITH: DR. [facility], AT 0715 03/07/17 BY Rush Landmark Performed at [facility], [place] [address], [place], [place] [address]    Culture METHICILLIN RESISTANT STAPHYLOCOCCUS AUREUS (A)  Final   Report Status 03/08/2017 FINAL  Final   Organism ID, Bacteria METHICILLIN RESISTANT STAPHYLOCOCCUS AUREUS  Final      Susceptibility   Methicillin resistant staphylococcus aureus - MIC*    CIPROFLOXACIN <=0.5 SENSITIVE Sensitive     ERYTHROMYCIN >=8 RESISTANT Resistant     GENTAMICIN <=0.5 SENSITIVE Sensitive     OXACILLIN >=4 RESISTANT Resistant     TETRACYCLINE <=1 SENSITIVE Sensitive     VANCOMYCIN <=0.5 SENSITIVE Sensitive     TRIMETH/SULFA <=10 SENSITIVE Sensitive     CLINDAMYCIN <=0.25 SENSITIVE Sensitive     RIFAMPIN <=0.5 SENSITIVE Sensitive     Inducible Clindamycin NEGATIVE Sensitive     * METHICILLIN RESISTANT STAPHYLOCOCCUS AUREUS  Blood Culture ID Panel (Reflexed)     Status: Abnormal   Collection Time: 03/06/17  1:55 AM  Result Value Ref Range Status   Enterococcus species NOT DETECTED NOT DETECTED Final   Listeria monocytogenes NOT DETECTED NOT DETECTED Final   Staphylococcus species DETECTED (A) NOT DETECTED Final    Comment: CRITICAL RESULT CALLED TO, READ BACK BY AND VERIFIED WITH: DR. [facility], AT 0715 03/07/17 BY D. VANHOOK    Staphylococcus aureus DETECTED (A) NOT DETECTED Final    Comment: Methicillin (oxacillin)-resistant Staphylococcus aureus (MRSA). MRSA is predictably resistant to beta-lactam antibiotics (except ceftaroline). Preferred therapy is vancomycin unless clinically contraindicated. Patient requires contact precautions if  hospitalized. CRITICAL RESULT CALLED TO, READ BACK BY  AND VERIFIED WITH: DR. [facility], AT 0715 03/07/17 BY D. VANHOOK    Methicillin resistance DETECTED (A) NOT DETECTED Final    Comment: CRITICAL RESULT CALLED TO, READ BACK BY AND VERIFIED WITH: DR. [facility], AT 0715 03/07/17 BY D. VANHOOK    Streptococcus species NOT DETECTED NOT DETECTED Final   Streptococcus agalactiae NOT DETECTED NOT DETECTED Final   Streptococcus pneumoniae NOT DETECTED NOT DETECTED Final   Streptococcus pyogenes NOT DETECTED NOT DETECTED Final   Acinetobacter baumannii NOT DETECTED NOT DETECTED Final   Enterobacteriaceae species NOT DETECTED NOT DETECTED Final   Enterobacter cloacae complex NOT DETECTED NOT DETECTED Final   Escherichia coli NOT DETECTED NOT DETECTED Final   Klebsiella oxytoca NOT DETECTED NOT DETECTED Final   Klebsiella pneumoniae NOT DETECTED NOT DETECTED Final   Proteus species NOT DETECTED NOT DETECTED Final   Serratia marcescens NOT DETECTED NOT DETECTED Final   Haemophilus influenzae NOT DETECTED NOT DETECTED Final   Neisseria meningitidis NOT DETECTED NOT DETECTED Final   Pseudomonas aeruginosa NOT DETECTED NOT DETECTED Final   Candida albicans NOT DETECTED NOT DETECTED Final   Candida glabrata NOT DETECTED NOT  DETECTED Final   Candida krusei NOT DETECTED NOT DETECTED Final   Candida parapsilosis NOT DETECTED NOT DETECTED Final   Candida tropicalis NOT DETECTED NOT DETECTED Final    Comment: Performed at [facility], [place] [address], [place], [place] [address]  Culture, blood (routine x 2)     Status: None   Collection Time: 03/06/17  2:51 PM  Result Value Ref Range Status   Specimen Description BLOOD LEFT ARM  Final   Special Requests IN PEDIATRIC BOTTLE Blood Culture adequate volume  Final   Culture   Final    NO GROWTH 5 DAYS Performed at [facility], [place] [address] [place], [place] [address]    Report Status 03/11/2017 FINAL  Final  Culture, blood (Routine X 2) w Reflex to ID Panel     Status: None   Collection  Time: 03/07/17 12:15 PM  Result Value Ref Range Status   Specimen Description BLOOD LEFT ARM  Final   Special Requests   Final    BOTTLES DRAWN AEROBIC ONLY Blood Culture adequate volume   Culture   Final    NO GROWTH 5 DAYS Performed at [facility], [place] [address], [place], [place] [address]    Report Status 03/12/2017 FINAL  Final  Culture, blood (Routine X 2) w Reflex to ID Panel     Status: Abnormal   Collection Time: 03/07/17 12:15 PM  Result Value Ref Range Status   Specimen Description BLOOD RIGHT ANTECUBITAL  Final   Special Requests IN PEDIATRIC BOTTLE Blood Culture adequate volume  Final   Culture  Setup Time   Final    GRAM POSITIVE COCCI IN CLUSTERS IN PEDIATRIC BOTTLE CRITICAL RESULT CALLED TO, READ BACK BY AND VERIFIED WITH: N. [place] PHARMD, AT [place] 03/08/17 BY D. VANHOOK    Culture (A)  Final    STAPHYLOCOCCUS AUREUS SUSCEPTIBILITIES PERFORMED ON PREVIOUS CULTURE WITHIN THE LAST 5 DAYS. Performed at [facility], [place] [address], [place], [place] [address]    Report Status 03/09/2017 FINAL  Final  MRSA PCR Screening     Status: None   Collection Time: 03/08/17 10:30 AM  Result Value Ref Range Status   MRSA by PCR NEGATIVE NEGATIVE Final    Comment:        The GeneXpert MRSA Assay (FDA approved for NASAL specimens only), is one component of a comprehensive MRSA colonization surveillance program. It is not intended to diagnose MRSA infection nor to guide or monitor treatment for MRSA infections.   Aerobic Culture (superficial specimen)     Status: None   Collection Time: 03/08/17  6:46 PM  Result Value Ref Range Status   Specimen Description ABSCESS LEFT PECTORAL  Final   Special Requests NONE  Final   Gram Stain   Final    ABUNDANT WBC PRESENT, PREDOMINANTLY PMN ABUNDANT GRAM POSITIVE COCCI IN CLUSTERS Performed at [facility], [address], [place], [place] [address]    Culture   Final    ABUNDANT METHICILLIN RESISTANT  STAPHYLOCOCCUS AUREUS   Report Status 03/11/2017 FINAL  Final   Organism ID, Bacteria METHICILLIN RESISTANT STAPHYLOCOCCUS AUREUS  Final      Susceptibility   Methicillin resistant staphylococcus aureus - MIC*    CIPROFLOXACIN <=0.5 SENSITIVE Sensitive     ERYTHROMYCIN >=8 RESISTANT Resistant     GENTAMICIN <=0.5 SENSITIVE Sensitive     OXACILLIN >=4 RESISTANT Resistant     TETRACYCLINE <=1 SENSITIVE Sensitive  VANCOMYCIN 1 SENSITIVE Sensitive     TRIMETH/SULFA <=10 SENSITIVE Sensitive     CLINDAMYCIN <=0.25 SENSITIVE Sensitive     RIFAMPIN <=0.5 SENSITIVE Sensitive     Inducible Clindamycin NEGATIVE Sensitive     * ABUNDANT METHICILLIN RESISTANT STAPHYLOCOCCUS AUREUS  C difficile quick scan w PCR reflex     Status: None   Collection Time: 03/11/17 12:50 PM  Result Value Ref Range Status   C Diff antigen NEGATIVE NEGATIVE Final   C Diff toxin NEGATIVE NEGATIVE Final   C Diff interpretation No C. difficile detected.  Final       Blood Culture    Component Value Date/Time   SDES ABSCESS LEFT PECTORAL 03/08/2017 1846   SPECREQUEST NONE 03/08/2017 1846   CULT  03/08/2017 1846    ABUNDANT METHICILLIN RESISTANT STAPHYLOCOCCUS AUREUS   REPTSTATUS 03/11/2017 FINAL 03/08/2017 1846      Labs: Results for orders placed or performed during the hospital encounter of 03/07/17 (from the past 48 hour(s))  C difficile quick scan w PCR reflex     Status: None   Collection Time: 03/11/17 12:50 PM  Result Value Ref Range   C Diff antigen NEGATIVE NEGATIVE   C Diff toxin NEGATIVE NEGATIVE   C Diff interpretation No C. difficile detected.   Creatinine, serum     Status: Abnormal   Collection Time: 03/12/17  3:42 AM  Result Value Ref Range   Creatinine, Ser 0.51 (L) 0.61 - 1.24 mg/dL   GFR calc non Af Amer >60 >60 mL/min   GFR calc Af Amer >60 >60 mL/min    Comment: (NOTE) The eGFR has been calculated using the CKD EPI equation. This calculation has not been validated in all  clinical situations. eGFR's persistently <60 mL/min signify possible Chronic Kidney Disease.   Creatinine, serum     Status: Abnormal   Collection Time: 03/13/17  4:25 AM  Result Value Ref Range   Creatinine, Ser 0.54 (L) 0.61 - 1.24 mg/dL   GFR calc non Af Amer >60 >60 mL/min   GFR calc Af Amer >60 >60 mL/min    Comment: (NOTE) The eGFR has been calculated using the CKD EPI equation. This calculation has not been validated in all clinical situations. eGFR's persistently <60 mL/min signify possible Chronic Kidney Disease.      Lipid Panel  No results found for: CHOL, TRIG, HDL, CHOLHDL, VLDL, LDLCALC, LDLDIRECT   No results found for: HGBA1C   Lab Results  Component Value Date   CREATININE 0.54 (L) 03/13/2017     HPI :  21 y.o.male,ith past medical history of polysubstance abuse, he reports heroin, tobacco, cocaine, IV drugs, patient was admitted 03/06/2017, for upper chest wall cellulitis with Pyomyositis, was started empirically on IV vancomycin and Azactam, he was smoking in his room, asking only for IV pain medication, he signed AMA the same day, his blood cultures came back positive for MRSA bacteremia , hospital called patient to come back for direct admission for treatment of his bacteremia,TEE 03/08/2017 with no evidence of endocarditis, surgery consulted regarding left chest wall abscess which required surgical intervention.  HOSPITAL COURSE:   Sepsis due to MRSA bacteremia/left pectoralis muscle cellulitis, Pyomyositis and abscess - patient presented with fever, leukocytosis and tachycardia on admission (meeting sepsis criteria). -sepsis features resolving now,  -wound looking much better and per surgery no further debridement anticipated now. Patient treated with vancomycin and Zosyn for 5 days IV, infectious disease consulted, patient transition to oral Bactrim 1 month  at discharge   continue wound care and follow surgery recommendations,   -TEE neg for  endocarditis  Left Upper chest wall cellulitis Tresa Garter -Likely due to seeding from bacteremia, continue with IV antibiotics. -patient s/p extensive I&D on 10/19 -ID  has made antibiotic recommendations  Continue Tylenol for pain control  Polysubstance abuse/IV drug abuse -cessation counseling provided again  -patient was receptive and said he is done using drugs -no active signs of withdrawal appreciated Received Ativan and Atarax as needed May benefit from outpatient psychiatric evaluation  Tobacco abuse -cessation counseling once again encouraged -continue nicotine patch while inpatient  87m left upper lobe subpleural nodule: -Patient has this incidental finding on CT of the chest -will need non contrast CT chest in 12 months for follow up. PCP appointment will be set up prior to discharge -no SOB, no weight loss -had hx of smoking   Loose stools -Patient C. Diff negative -will continue Florastor twice a day -no nausea, no vomiting, no abd pain   Discharge Exam:   Blood pressure 125/79, pulse 67, temperature 98.6 F (37 C), temperature source Oral, resp. rate 18, height 5' 5"  (1.651 m), weight 61.2 kg (135 lb), SpO2 98 %.  GEN: remains afebrile, no nausea, no vomiting, no abd pain. Patient still with pain in his chest but improved. Neg C. Diff  Cardiovascular: no rubs, no gallops, no murmurs, RRR  Resp: CTA bilaterally  Abd: soft, NT, ND, positive BS.   Extremities: no edema, no cyanosis    Skin: significant improvement in his wound with use of dakin's solution and current wound care; minimal discharge and more healthy tissue appreciated. Pain still present on palpation, but much improved. No surrounding erythema.     Follow-up Information    [facility].   Specialty:  General Surgery Why:  Your appointment is at 11:15 AM. Please arrive 30 min prior to appointment time. Bring photo ID and any insurance information. Contact  information: [address] [phone]       PCP. Call.   Why:  For appointment, hospital follow-up in 3-5 days          Signed: NReyne Dumas10/24/2018, 10:11 AM        Time spent >1 hour

## 2017-03-17 LAB — CULTURE, BLOOD (ROUTINE X 2)
CULTURE: NO GROWTH
Special Requests: ADEQUATE

## 2018-08-04 ENCOUNTER — Other Ambulatory Visit: Payer: Self-pay

## 2018-08-04 ENCOUNTER — Encounter (HOSPITAL_COMMUNITY): Payer: Self-pay

## 2018-08-04 ENCOUNTER — Emergency Department (HOSPITAL_COMMUNITY)
Admission: EM | Admit: 2018-08-04 | Discharge: 2018-08-04 | Disposition: A | Discharge status: Home / Self Care | Payer: Medicaid Other | Attending: Emergency Medicine | Admitting: Emergency Medicine

## 2018-08-04 DIAGNOSIS — F17210 Nicotine dependence, cigarettes, uncomplicated: Secondary | ICD-10-CM | POA: Insufficient documentation

## 2018-08-04 DIAGNOSIS — F1990 Other psychoactive substance use, unspecified, uncomplicated: Secondary | ICD-10-CM | POA: Insufficient documentation

## 2018-08-04 DIAGNOSIS — Z7982 Long term (current) use of aspirin: Secondary | ICD-10-CM | POA: Insufficient documentation

## 2018-08-04 HISTORY — DX: Carrier or suspected carrier of methicillin resistant Staphylococcus aureus: Z22.322

## 2018-08-04 NOTE — ED Triage Notes (Signed)
Patient has abscesses to bilateral antecubital areas. Patient uses IV heroin and meth yesterday.  Patient states he was trying to get into [facility] and he was told that he needed antibiotics for the abscesses  That he has before they would accept him

## 2018-08-04 NOTE — ED Notes (Signed)
PT STATES HE HAS A DETOX PLACEMENT AT [facility] IF HE CAN GET ANTIBIOTICS FOR ABSCESS. DENIES FEVER, PAIN, REDNESS. HX OF THE SAME. HX OF MRSA. ONCE GIVEN ANTIBIOTIC, PT CAN GO FOR DETOX

## 2018-08-04 NOTE — Discharge Instructions (Addendum)
Your exam today is not consistent with abscess or infection. At this time we do not recommend antibiotics.

## 2018-08-04 NOTE — ED Provider Notes (Signed)
[place] DEPT Provider Note   CSN: [id number] Arrival date & time: 08/04/18  1641    History   Chief Complaint Chief Complaint  Patient presents with  . Abscess    HPI Joseph Nunez is a 23 y.o. male w PMHx IVDU, MRSA, presenting to the ED with complaint of IV drug use.  He states he attempted to check into the [facility] for rehabilitation, however they were concerned for possible abscess and requested he be seen in the ED for antibiotics prior to being accepted.  Patient endorses heroin use, last use was yesterday.  He denies any symptoms of pain, swelling, or redness.  He has no fever or chills.     The history is provided by the patient.    Past Medical History:  Diagnosis Date  . Cocaine abuse ([place])   . Heroin abuse ([place])   . IVDU (intravenous drug user)   . MRSA (methicillin resistant staph aureus) culture positive   . Tobacco abuse     Patient Active Problem List   Diagnosis Date Noted  . Abscesses of parasternal chest wall s/p I&D 03/08/2017 03/08/2017  . Abscess of right chest wall s/p I&D 03/08/2017 03/08/2017  . Bacteremia 03/07/2017  . Sepsis ([place]) 03/06/2017  . Pyomyositis 03/06/2017  . Cellulitis of pectoral region 03/06/2017  . Heroin abuse ([place])   . IVDU (intravenous drug user)   . Cocaine abuse ([place])   . Tobacco abuse     Past Surgical History:  Procedure Laterality Date  . IRRIGATION AND DEBRIDEMENT ABSCESS N/A 03/08/2017   Procedure: IRRIGATION AND DEBRIDEMENT LEFT CHEST ABSCESS;  Surgeon: Michael Boston, MD;  Location: [facility];  Service: General;  Laterality: N/A;  . TEE WITHOUT CARDIOVERSION N/A 03/08/2017   Procedure: TRANSESOPHAGEAL ECHOCARDIOGRAM (TEE);  Surgeon: Skeet Latch, MD;  Location: [facility];  Service: Cardiovascular;  Laterality: N/A;        Home Medications    Prior to Admission medications   Medication Sig Start Date End Date Taking? Authorizing Provider  aspirin 81 MG EC tablet  Take 81 mg by mouth daily. Swallow whole.   Yes [provider]  methocarbamol (ROBAXIN) 500 MG tablet Take 1 tablet (500 mg total) by mouth every 6 (six) hours as needed for muscle spasms. Patient not taking: Reported on 08/04/2018 03/13/17   Reyne Dumas, MD  nicotine (NICODERM CQ - DOSED IN MG/24 HOURS) 21 mg/24hr patch Place 1 patch (21 mg total) onto the skin daily. Patient not taking: Reported on 08/04/2018 03/13/17   Reyne Dumas, MD  pantoprazole (PROTONIX) 40 MG tablet Take 1 tablet (40 mg total) by mouth daily at 12 noon. Patient not taking: Reported on 08/04/2018 03/13/17   Reyne Dumas, MD  saccharomyces boulardii (FLORASTOR) 250 MG capsule Take 1 capsule (250 mg total) by mouth 2 (two) times daily. Patient not taking: Reported on 08/04/2018 03/13/17   Reyne Dumas, MD    Family History Family History  Problem Relation Age of Onset  . Heart disease Mother   . Diabetes Mellitus II Sister     Social History Social History   Tobacco Use  . Smoking status: Current Every Day Smoker    Packs/day: 1.00    Types: Cigarettes  . Smokeless tobacco: Never Used  Substance Use Topics  . Alcohol use: No  . Drug use: Yes    Types: "Crack" cocaine, Heroin, Cocaine, Methamphetamines    Comment: heroin,      Allergies   Patient has no known  allergies.   Review of Systems Review of Systems  Constitutional: Negative for chills and fever.  Skin: Negative for color change.  All other systems reviewed and are negative.    Physical Exam Updated Vital Signs BP 125/83   Pulse 79   Temp 97.8 F (36.6 C) (Oral)   Resp 18   Ht 5\' 5"  (1.651 m)   Wt 65.8 kg   SpO2 100%   BMI 24.13 kg/m   Physical Exam Vitals signs and nursing note reviewed.  Constitutional:      General: He is not in acute distress.    Appearance: He is well-developed. He is not ill-appearing.  HENT:     Head: Normocephalic and atraumatic.  Eyes:     Conjunctiva/sclera: Conjunctivae normal.   Cardiovascular:     Rate and Rhythm: Normal rate and regular rhythm.  Pulmonary:     Effort: Pulmonary effort is normal.     Breath sounds: Normal breath sounds.  Skin:    Comments: Multiple track marks to the fingers, hands, and forearms bilaterally; none of them appear inflamed.  There is no drainage or tenderness.  No evidence of abscess or infection.  Neurological:     Mental Status: He is alert.  Psychiatric:        Mood and Affect: Mood normal.        Behavior: Behavior normal.      ED Treatments / Results  Labs (all labs ordered are listed, but only abnormal results are displayed) Labs Reviewed - No data to display  EKG None  Radiology No results found.  Procedures Procedures (including critical care time)  Medications Ordered in ED Medications - No data to display   Initial Impression / Assessment and Plan / ED Course  I have reviewed the triage vital signs and the nursing notes.  Pertinent labs & imaging results that were available during my care of the patient were reviewed by me and considered in my medical decision making (see chart for details).        Patient presenting with concern for need of antibiotic per the [facility], prior to admission for rehabilitation.  Patient is IV drug user, last use of heroin was yesterday.  No signs or symptoms of current infection.  No evidence of abscess or skin infection on exam.  At this time, do not recommend antibiotics.  Patient agreeable to plan and safe for discharge.  Discussed results, findings, treatment and follow up. Patient advised of return precautions. Patient verbalized understanding and agreed with plan.   Final Clinical Impressions(s) / ED Diagnoses   Final diagnoses:  IVDU (intravenous drug user)    ED Discharge Orders    None       , [place] N, PA-C 08/04/18 1951    Lennice Sites, DO 08/05/18 0023

## 2018-08-04 NOTE — ED Notes (Signed)
PT STATES HE LAST USE HEROIN YESTERDAY AND METH

## 2020-03-24 ENCOUNTER — Emergency Department (HOSPITAL_COMMUNITY): Admission: EM | Admit: 2020-03-24 | Discharge: 2020-03-24 | Discharge status: Against Medical Advice | Payer: Self-pay

## 2020-04-18 ENCOUNTER — Emergency Department (HOSPITAL_COMMUNITY)
Admission: EM | Admit: 2020-04-18 | Discharge: 2020-04-18 | Disposition: A | Discharge status: Home / Self Care | Payer: Self-pay | Attending: Emergency Medicine | Admitting: Emergency Medicine

## 2020-04-18 ENCOUNTER — Other Ambulatory Visit: Payer: Self-pay

## 2020-04-18 ENCOUNTER — Encounter (HOSPITAL_COMMUNITY): Payer: Self-pay | Admitting: Emergency Medicine

## 2020-04-18 DIAGNOSIS — Z5321 Procedure and treatment not carried out due to patient leaving prior to being seen by health care provider: Secondary | ICD-10-CM | POA: Insufficient documentation

## 2020-04-18 DIAGNOSIS — K1370 Unspecified lesions of oral mucosa: Secondary | ICD-10-CM | POA: Insufficient documentation

## 2020-04-18 DIAGNOSIS — R509 Fever, unspecified: Secondary | ICD-10-CM | POA: Insufficient documentation

## 2020-04-18 LAB — URINALYSIS, ROUTINE W REFLEX MICROSCOPIC
Bilirubin Urine: NEGATIVE
Glucose, UA: NEGATIVE mg/dL
Hgb urine dipstick: NEGATIVE
Ketones, ur: NEGATIVE mg/dL
Leukocytes,Ua: NEGATIVE
Nitrite: NEGATIVE
Protein, ur: NEGATIVE mg/dL
Specific Gravity, Urine: 1.025 (ref 1.005–1.030)
pH: 5 (ref 5.0–8.0)

## 2020-04-18 LAB — CBC WITH DIFFERENTIAL/PLATELET
Abs Immature Granulocytes: 0.03 10*3/uL (ref 0.00–0.07)
Basophils Absolute: 0 10*3/uL (ref 0.0–0.1)
Basophils Relative: 0 %
Eosinophils Absolute: 0.1 10*3/uL (ref 0.0–0.5)
Eosinophils Relative: 1 %
HCT: 40.8 % (ref 39.0–52.0)
Hemoglobin: 13.6 g/dL (ref 13.0–17.0)
Immature Granulocytes: 0 %
Lymphocytes Relative: 11 %
Lymphs Abs: 1.1 10*3/uL (ref 0.7–4.0)
MCH: 28.8 pg (ref 26.0–34.0)
MCHC: 33.3 g/dL (ref 30.0–36.0)
MCV: 86.3 fL (ref 80.0–100.0)
Monocytes Absolute: 0.7 10*3/uL (ref 0.1–1.0)
Monocytes Relative: 7 %
Neutro Abs: 8 10*3/uL — ABNORMAL HIGH (ref 1.7–7.7)
Neutrophils Relative %: 81 %
Platelets: 302 10*3/uL (ref 150–400)
RBC: 4.73 MIL/uL (ref 4.22–5.81)
RDW: 12.8 % (ref 11.5–15.5)
WBC: 10 10*3/uL (ref 4.0–10.5)
nRBC: 0 % (ref 0.0–0.2)

## 2020-04-18 LAB — COMPREHENSIVE METABOLIC PANEL
ALT: 173 U/L — ABNORMAL HIGH (ref 0–44)
AST: 47 U/L — ABNORMAL HIGH (ref 15–41)
Albumin: 3.8 g/dL (ref 3.5–5.0)
Alkaline Phosphatase: 86 U/L (ref 38–126)
Anion gap: 10 (ref 5–15)
BUN: 9 mg/dL (ref 6–20)
CO2: 28 mmol/L (ref 22–32)
Calcium: 9.4 mg/dL (ref 8.9–10.3)
Chloride: 94 mmol/L — ABNORMAL LOW (ref 98–111)
Creatinine, Ser: 0.74 mg/dL (ref 0.61–1.24)
GFR, Estimated: >60 mL/min (ref >60)
Glucose, Bld: 116 mg/dL — ABNORMAL HIGH (ref 70–99)
Potassium: 3.9 mmol/L (ref 3.5–5.1)
Sodium: 132 mmol/L — ABNORMAL LOW (ref 135–145)
Total Bilirubin: 0.9 mg/dL (ref 0.3–1.2)
Total Protein: 8 g/dL (ref 6.5–8.1)

## 2020-04-18 LAB — LACTIC ACID, PLASMA: Lactic Acid, Venous: 1.4 mmol/L (ref 0.5–1.9)

## 2020-04-18 NOTE — ED Notes (Signed)
Christine (Mother#[phone]) called/would like a call back from the patient for an updated.  Thank you

## 2020-04-18 NOTE — ED Triage Notes (Signed)
Pt. Stated, Ive had fever, sore on my mouth and on my rt. Arm for 3-4 days. Ive had MRSA before.

## 2021-03-16 ENCOUNTER — Ambulatory Visit (HOSPITAL_COMMUNITY)
Admission: EM | Admit: 2021-03-16 | Discharge: 2021-03-16 | Disposition: A | Discharge status: Home / Self Care | Payer: No Payment, Other | Attending: Registered Nurse | Admitting: Registered Nurse

## 2021-03-16 ENCOUNTER — Encounter (HOSPITAL_COMMUNITY): Payer: Self-pay | Admitting: Registered Nurse

## 2021-03-16 DIAGNOSIS — F1410 Cocaine abuse, uncomplicated: Secondary | ICD-10-CM | POA: Diagnosis not present

## 2021-03-16 DIAGNOSIS — F1120 Opioid dependence, uncomplicated: Secondary | ICD-10-CM | POA: Diagnosis not present

## 2021-03-16 DIAGNOSIS — F22 Delusional disorders: Secondary | ICD-10-CM | POA: Insufficient documentation

## 2021-03-16 DIAGNOSIS — F1110 Opioid abuse, uncomplicated: Secondary | ICD-10-CM
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
## 2021-03-16 NOTE — Discharge Summary (Signed)
Don Balistreri to be D/C'd Home per NP order. Discussed with the patient and all questions fully answered. An After Visit Summary was printed and given to the patient. Patient escorted out and D/C home via private auto.  Clois Dupes  03/16/2021 3:08 PM

## 2021-03-16 NOTE — Discharge Instructions (Addendum)
Substance Abuse Resources  [facility] - Admissions are currently completed Monday through Friday at [facility]; both appointments and walk-ins are accepted.  Any individual that is a [facility] resident may present for a substance abuse screening and assessment for admission.  A person may be referred by numerous sources or self-refer.   Potential clients will be screened for medical necessity and appropriateness for the program.  Clients must meet criteria for high-intensity residential treatment services.  If clinically appropriate, a client will continue with the comprehensive clinical assessment and intake process, as well as enrollment in the [facility].   Address: [address] [place], [place] [address] Admin Hours: Mon-Fri 8AM to [place] Hours: 24/7 Phone: [phone] Fax: [phone]   [facility] Facility Based Crisis:  These are 3 locations for services: Please call before arrival    Address: [address] [place], [place] [address] Phone: [phone]   Address: [address] [place], [place] [address] Phone#: [phone]   Address: [address] [place], [place] [address] Phone#: [phone]     [facility]): (offers outpatient therapy and intensive outpatient substance abuse therapy).  [address], [place], [place] [address] Phone: [phone]   [facility]: Offers FREE recovery skills classes, support groups, 1:1 Peer Support, and Compeer Classes. [address] [place], [place] [address] Phone: [phone] (Call to complete intake).  [facility] [address] [place], [place] [address] Phone: [phone] ext: [facility] provides food, shelter and other programs and services to the homeless men of [place] through our [employer].   By offering safe shelter, three meals a day,  clean clothing, Biblical counseling, financial planning, vocational training, GED/education and employment assistance, we've helped mend the shattered lives of many homeless men since opening in 1974.   We have approximately 267 beds available, with a max of 312 beds including mats for emergency situations and currently house an average of 270 men a night.   Prospective Client Check-In Information Photo ID Required (State/ Out of State/ [facility]) - if photo ID is not available, clients are required to have a printout of a police/sheriff's criminal history report. Help out with chores around the [place]. No sex offender of any type (pending, charged, registered and/or any other sex related offenses) will be permitted to check in. Must be willing to abide by all rules, regulations, and policies established by the [employer]. The following will be provided - shelter, food, clothing, and biblical counseling. If you or someone you know is in need of assistance at our [facility] shelter in [place], [place], please call [phone] ext. [phone].   [facility]-will provide timely access to mental health services for children and adolescents (4-17) and adults presenting in a mental health crisis. The program is designed for those who need urgent Behavioral Health or Substance Use treatment and are not experiencing a medical crisis that would typically require an emergency room visit.    [place], [place] [address] Phone: [phone] [email or web address]: Phone#: [phone]   The [facility] SA Intensive Outpatient Program (SAIOP) means structured individual and group addiction activities and services that are provided at an outpatient program designed to assist adult and adolescent consumers to begin recovery and learn skills for recovery maintenance. The [place] program is offered at  least 3 hours a  day, 3 days a week.SAIOP services shall include a structured program consisting of, but not limited to, the following services: Individual counseling and support; Group counseling and support; Family counseling, training or support; Biochemical assays to identify recent drug use (e.g., urine drug screens); Strategies for relapse prevention to include community and social support systems in treatment; Life skills; Crisis contingency planning; Disease Management; and Treatment support activities that have been adapted or specifically designed for persons with physical disabilities, or persons with co-occurring disorders of mental illness and substance abuse/dependence or mental retardation/developmental disability and substance abuse/dependence. Phone: [phone]     [facility]: [phone]  [place]: [phone]
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
## 2021-03-20 ENCOUNTER — Telehealth (HOSPITAL_COMMUNITY): Payer: Self-pay

## 2021-03-20 NOTE — BH Assessment (Signed)
Care Management - Follow Up [facility]UC Discharges   Writer attempted to make contact with patient today and was unsuccessful.  Writer left a HIPPA compliant voice message.   

## 2021-04-08 ENCOUNTER — Other Ambulatory Visit: Payer: Self-pay

## 2021-04-08 DIAGNOSIS — R0602 Shortness of breath: Secondary | ICD-10-CM | POA: Insufficient documentation

## 2021-04-08 DIAGNOSIS — Z5321 Procedure and treatment not carried out due to patient leaving prior to being seen by health care provider: Secondary | ICD-10-CM | POA: Insufficient documentation

## 2021-04-08 DIAGNOSIS — R0789 Other chest pain: Secondary | ICD-10-CM | POA: Insufficient documentation

## 2021-04-08 NOTE — ED Triage Notes (Addendum)
Patient arrives via EMS with complaints of chest pain and shortness of breath. Per EMS, patient has been uncooperative and not answering questions. Per mother, patient has been acting erratically at home. Patient during triage also acting bizarre, getting up and down from wheelchair, throwing his bag into the wheelchair and stomping off down the hall. Patient refusing to answer questions at this time. Patient is responding to internal stimuli, whispering and restless.

## 2021-04-08 NOTE — ED Provider Notes (Signed)
Emergency Medicine Provider Triage Evaluation Note  Joseph Nunez , a 25 y.o. male  was evaluated in triage.  Pt complains of chest pain.  Patient presents with EMS with complaints of chest pain.  Throughout the entire encounter with EMS patient has been agitated, responding to internal stimuli.  Family reports patient is without prior psych history.  He does have history of polysubstance abuse.  He does not engage in interview.  Abruptly answers yes when asked about chest pain.   Review of Systems  Positive: Chest pain Negative: Unable to obtain  Physical Exam  BP 125/79   Pulse 85   Temp 97.8 F (36.6 C) (Oral)   Resp (!) 22   SpO2 100%  Gen:   Awake, no distress   Resp:  Normal effort  MSK:   Moves extremities without difficulty  Other:  Appears agitated  Medical Decision Making  Medically screening exam initiated at 11:58 PM.  Appropriate orders placed.  Braden Drzewiecki was informed that the remainder of the evaluation will be completed by another provider, this initial triage assessment does not replace that evaluation, and the importance of remaining in the ED until their evaluation is complete.     Evlyn Courier, PA-C 04/09/21 0000    Quintella Reichert, MD 04/09/21 [phone]

## 2021-04-09 ENCOUNTER — Encounter (HOSPITAL_COMMUNITY): Payer: Self-pay | Admitting: Emergency Medicine

## 2021-04-09 ENCOUNTER — Emergency Department (HOSPITAL_COMMUNITY)
Admission: EM | Admit: 2021-04-09 | Discharge: 2021-04-09 | Disposition: A | Discharge status: Home / Self Care | Payer: Self-pay | Attending: Emergency Medicine | Admitting: Emergency Medicine

## 2021-04-09 ENCOUNTER — Emergency Department (HOSPITAL_COMMUNITY): Payer: Self-pay

## 2021-04-09 DIAGNOSIS — R079 Chest pain, unspecified: Secondary | ICD-10-CM

## 2021-04-09 NOTE — ED Notes (Addendum)
After telling patient that we were going to draw blood work, patient grabbed his bag and went to go out of the double doors. Asked patient if he was staying to be seen, to which patient stopped at the double doors and paused. Asked patient again, and he did not answer again and proceeded to go out of the double doors. Discussed with patient multiple times that if he leaves, that we will have to discharge him out of the system, to which patient did verbalize understanding. Patient informed that he will be removed from the system.

## 2021-04-20 ENCOUNTER — Emergency Department (HOSPITAL_COMMUNITY)
Admission: EM | Admit: 2021-04-20 | Discharge: 2021-04-21 | Disposition: A | Discharge status: Home / Self Care | Payer: Self-pay | Attending: Physician Assistant | Admitting: Physician Assistant

## 2021-04-20 ENCOUNTER — Other Ambulatory Visit: Payer: Self-pay

## 2021-04-20 ENCOUNTER — Emergency Department (HOSPITAL_COMMUNITY): Payer: Self-pay

## 2021-04-20 ENCOUNTER — Encounter (HOSPITAL_COMMUNITY): Payer: Self-pay | Admitting: Emergency Medicine

## 2021-04-20 DIAGNOSIS — R509 Fever, unspecified: Secondary | ICD-10-CM | POA: Insufficient documentation

## 2021-04-20 DIAGNOSIS — L97929 Non-pressure chronic ulcer of unspecified part of left lower leg with unspecified severity: Secondary | ICD-10-CM | POA: Insufficient documentation

## 2021-04-20 DIAGNOSIS — Z5321 Procedure and treatment not carried out due to patient leaving prior to being seen by health care provider: Secondary | ICD-10-CM | POA: Insufficient documentation

## 2021-04-20 LAB — CBC WITH DIFFERENTIAL/PLATELET
Abs Immature Granulocytes: 0.04 10*3/uL (ref 0.00–0.07)
Basophils Absolute: 0 10*3/uL (ref 0.0–0.1)
Basophils Relative: 0 %
Eosinophils Absolute: 0.1 10*3/uL (ref 0.0–0.5)
Eosinophils Relative: 1 %
HCT: 41.4 % (ref 39.0–52.0)
Hemoglobin: 13.3 g/dL (ref 13.0–17.0)
Immature Granulocytes: 0 %
Lymphocytes Relative: 19 %
Lymphs Abs: 2.4 10*3/uL (ref 0.7–4.0)
MCH: 29.6 pg (ref 26.0–34.0)
MCHC: 32.1 g/dL (ref 30.0–36.0)
MCV: 92 fL (ref 80.0–100.0)
Monocytes Absolute: 0.7 10*3/uL (ref 0.1–1.0)
Monocytes Relative: 6 %
Neutro Abs: 9.4 10*3/uL — ABNORMAL HIGH (ref 1.7–7.7)
Neutrophils Relative %: 74 %
Platelets: 375 10*3/uL (ref 150–400)
RBC: 4.5 MIL/uL (ref 4.22–5.81)
RDW: 13.6 % (ref 11.5–15.5)
WBC: 12.7 10*3/uL — ABNORMAL HIGH (ref 4.0–10.5)
nRBC: 0 % (ref 0.0–0.2)

## 2021-04-20 LAB — COMPREHENSIVE METABOLIC PANEL
ALT: 40 U/L (ref 0–44)
AST: 17 U/L (ref 15–41)
Albumin: 3.4 g/dL — ABNORMAL LOW (ref 3.5–5.0)
Alkaline Phosphatase: 75 U/L (ref 38–126)
Anion gap: 6 (ref 5–15)
BUN: 11 mg/dL (ref 6–20)
CO2: 31 mmol/L (ref 22–32)
Calcium: 9.2 mg/dL (ref 8.9–10.3)
Chloride: 101 mmol/L (ref 98–111)
Creatinine, Ser: 0.82 mg/dL (ref 0.61–1.24)
GFR, Estimated: >60 mL/min (ref >60)
Glucose, Bld: 114 mg/dL — ABNORMAL HIGH (ref 70–99)
Potassium: 4.1 mmol/L (ref 3.5–5.1)
Sodium: 138 mmol/L (ref 135–145)
Total Bilirubin: 0.2 mg/dL — ABNORMAL LOW (ref 0.3–1.2)
Total Protein: 7.5 g/dL (ref 6.5–8.1)

## 2021-04-20 NOTE — ED Triage Notes (Signed)
Pt presents with sores to BLE. Hx IV drug use, pt also asking to be started on methadone.

## 2021-04-20 NOTE — ED Provider Notes (Signed)
Emergency Medicine Provider Triage Evaluation Note  Joseph Nunez , a 25 y.o. male  was evaluated in triage.  Pt complains of ulcerations to left lower extremity.  History of IV drug use.  Patient states that he started developing these about a week ago, has been running fevers. He has ulcerations to his left knee as well, has been able to bend his knee.  Review of Systems  Positive: As above Negative: As above  Physical Exam  There were no vitals taken for this visit. Gen:   Awake, no distress   Resp:  Normal effort  MSK:   Moves extremities without difficulty  Other:  Multiple ulcerations to the left lower extremity and left knee.  Full flexion extension of left knee.  No surrounding swelling or warmth of the left knee.  Medical Decision Making  Medically screening exam initiated at 10:31 PM.  Appropriate orders placed.  Ledell Serio was informed that the remainder of the evaluation will be completed by another provider, this initial triage assessment does not replace that evaluation, and the importance of remaining in the ED until their evaluation is complete.     Lyndel Safe 04/20/21 2232    Lacretia Leigh, MD 04/24/21 [phone]

## 2021-04-21 LAB — LACTIC ACID, PLASMA: Lactic Acid, Venous: 2.5 mmol/L (ref 0.5–1.9)

## 2021-04-21 LAB — RAPID URINE DRUG SCREEN, HOSP PERFORMED
Amphetamines: POSITIVE — AB
Barbiturates: NOT DETECTED
Benzodiazepines: NOT DETECTED
Cocaine: POSITIVE — AB
Opiates: NOT DETECTED
Tetrahydrocannabinol: POSITIVE — AB

## 2021-04-21 LAB — URINALYSIS, ROUTINE W REFLEX MICROSCOPIC
Bilirubin Urine: NEGATIVE
Glucose, UA: NEGATIVE mg/dL
Hgb urine dipstick: NEGATIVE
Ketones, ur: NEGATIVE mg/dL
Leukocytes,Ua: NEGATIVE
Nitrite: NEGATIVE
Protein, ur: NEGATIVE mg/dL
Specific Gravity, Urine: >1.03 — ABNORMAL HIGH (ref 1.005–1.030)
pH: 6 (ref 5.0–8.0)

## 2021-04-21 NOTE — ED Notes (Signed)
Attempted to stick pt and unsuccessful. Pt rocking back and forth and talking to himself. Pt will not respond when spoken too.

## 2021-04-21 NOTE — ED Notes (Signed)
To add to last note.... When called to obtain the 2nd lactic pt was found coming out of restroom. Pt then walked behind Ayna, Phlebotomist several times back and forth in a strange manner with no meaning.

## 2021-04-26 LAB — CULTURE, BLOOD (ROUTINE X 2)
Culture: NO GROWTH
Culture: NO GROWTH
Special Requests: ADEQUATE
Special Requests: ADEQUATE

## 2021-11-07 ENCOUNTER — Emergency Department (HOSPITAL_COMMUNITY): Payer: Self-pay

## 2021-11-07 ENCOUNTER — Inpatient Hospital Stay (HOSPITAL_COMMUNITY)
Admission: EM | Admit: 2021-11-07 | Discharge: 2021-11-13 | DRG: 917 | Disposition: A | Discharge status: Home / Self Care | Payer: Self-pay | Attending: Internal Medicine | Admitting: Internal Medicine

## 2021-11-07 ENCOUNTER — Other Ambulatory Visit: Payer: Self-pay

## 2021-11-07 DIAGNOSIS — F1123 Opioid dependence with withdrawal: Secondary | ICD-10-CM | POA: Diagnosis not present

## 2021-11-07 DIAGNOSIS — J9601 Acute respiratory failure with hypoxia: Secondary | ICD-10-CM | POA: Diagnosis present

## 2021-11-07 DIAGNOSIS — F1920 Other psychoactive substance dependence, uncomplicated: Secondary | ICD-10-CM

## 2021-11-07 DIAGNOSIS — Z91199 Patient's noncompliance with other medical treatment and regimen due to unspecified reason: Secondary | ICD-10-CM

## 2021-11-07 DIAGNOSIS — F1414 Cocaine abuse with cocaine-induced mood disorder: Secondary | ICD-10-CM | POA: Diagnosis present

## 2021-11-07 DIAGNOSIS — R410 Disorientation, unspecified: Secondary | ICD-10-CM

## 2021-11-07 DIAGNOSIS — R451 Restlessness and agitation: Secondary | ICD-10-CM | POA: Diagnosis present

## 2021-11-07 DIAGNOSIS — X58XXXA Exposure to other specified factors, initial encounter: Secondary | ICD-10-CM | POA: Diagnosis present

## 2021-11-07 DIAGNOSIS — T405X1A Poisoning by cocaine, accidental (unintentional), initial encounter: Principal | ICD-10-CM | POA: Diagnosis present

## 2021-11-07 DIAGNOSIS — S0990XA Unspecified injury of head, initial encounter: Principal | ICD-10-CM

## 2021-11-07 DIAGNOSIS — S0101XA Laceration without foreign body of scalp, initial encounter: Secondary | ICD-10-CM | POA: Diagnosis present

## 2021-11-07 DIAGNOSIS — T50901A Poisoning by unspecified drugs, medicaments and biological substances, accidental (unintentional), initial encounter: Secondary | ICD-10-CM | POA: Diagnosis present

## 2021-11-07 DIAGNOSIS — F3113 Bipolar disorder, current episode manic without psychotic features, severe: Secondary | ICD-10-CM

## 2021-11-07 DIAGNOSIS — F319 Bipolar disorder, unspecified: Secondary | ICD-10-CM | POA: Diagnosis present

## 2021-11-07 DIAGNOSIS — R7401 Elevation of levels of liver transaminase levels: Secondary | ICD-10-CM | POA: Diagnosis present

## 2021-11-07 DIAGNOSIS — F19231 Other psychoactive substance dependence with withdrawal delirium: Secondary | ICD-10-CM | POA: Diagnosis not present

## 2021-11-07 DIAGNOSIS — E876 Hypokalemia: Secondary | ICD-10-CM | POA: Diagnosis present

## 2021-11-07 DIAGNOSIS — G928 Other toxic encephalopathy: Secondary | ICD-10-CM | POA: Diagnosis present

## 2021-11-07 DIAGNOSIS — J9600 Acute respiratory failure, unspecified whether with hypoxia or hypercapnia: Secondary | ICD-10-CM

## 2021-11-07 DIAGNOSIS — Z23 Encounter for immunization: Secondary | ICD-10-CM

## 2021-11-07 DIAGNOSIS — Z781 Physical restraint status: Secondary | ICD-10-CM

## 2021-11-07 DIAGNOSIS — F1424 Cocaine dependence with cocaine-induced mood disorder: Secondary | ICD-10-CM

## 2021-11-07 DIAGNOSIS — G9340 Encephalopathy, unspecified: Secondary | ICD-10-CM

## 2021-11-07 HISTORY — DX: Other psychoactive substance abuse, uncomplicated: F19.10

## 2021-11-07 LAB — CBC WITH DIFFERENTIAL/PLATELET
Abs Immature Granulocytes: 0.05 10*3/uL (ref 0.00–0.07)
Basophils Absolute: 0 10*3/uL (ref 0.0–0.1)
Basophils Relative: 0 %
Eosinophils Absolute: 0.1 10*3/uL (ref 0.0–0.5)
Eosinophils Relative: 1 %
HCT: 39.9 % (ref 39.0–52.0)
Hemoglobin: 13.7 g/dL (ref 13.0–17.0)
Immature Granulocytes: 0 %
Lymphocytes Relative: 18 %
Lymphs Abs: 2.2 10*3/uL (ref 0.7–4.0)
MCH: 30.9 pg (ref 26.0–34.0)
MCHC: 34.3 g/dL (ref 30.0–36.0)
MCV: 89.9 fL (ref 80.0–100.0)
Monocytes Absolute: 0.8 10*3/uL (ref 0.1–1.0)
Monocytes Relative: 6 %
Neutro Abs: 9.3 10*3/uL — ABNORMAL HIGH (ref 1.7–7.7)
Neutrophils Relative %: 75 %
Platelets: 257 10*3/uL (ref 150–400)
RBC: 4.44 MIL/uL (ref 4.22–5.81)
RDW: 12.3 % (ref 11.5–15.5)
WBC: 12.5 10*3/uL — ABNORMAL HIGH (ref 4.0–10.5)
nRBC: 0 % (ref 0.0–0.2)

## 2021-11-07 LAB — ETHANOL: Alcohol, Ethyl (B): <10 mg/dL (ref <10)

## 2021-11-07 LAB — PROTIME-INR
INR: 1 (ref 0.8–1.2)
Prothrombin Time: 12.6 seconds (ref 11.4–15.2)

## 2021-11-07 LAB — CBG MONITORING, ED: Glucose-Capillary: 89 mg/dL (ref 70–99)

## 2021-11-07 LAB — MAGNESIUM: Magnesium: 2.1 mg/dL (ref 1.7–2.4)

## 2021-11-07 MED ORDER — HALOPERIDOL LACTATE 5 MG/ML IJ SOLN
10.0000 mg | Freq: Once | INTRAMUSCULAR | Status: AC
  Administered 2021-11-07: 10 mg via INTRAVENOUS
  Filled 2021-11-07: qty 2

## 2021-11-07 MED ORDER — LACTATED RINGERS IV BOLUS
1000.0000 mL | Freq: Once | INTRAVENOUS | Status: AC
  Administered 2021-11-07: 1000 mL via INTRAVENOUS

## 2021-11-07 MED ORDER — LORAZEPAM 2 MG/ML IJ SOLN
2.0000 mg | Freq: Once | INTRAMUSCULAR | Status: AC
  Administered 2021-11-07: 2 mg via INTRAVENOUS
  Filled 2021-11-07: qty 1

## 2021-11-07 MED ORDER — LORAZEPAM 2 MG/ML IJ SOLN
2.0000 mg | Freq: Once | INTRAMUSCULAR | Status: AC
  Administered 2021-11-08: 2 mg via INTRAVENOUS
  Filled 2021-11-07: qty 1

## 2021-11-07 NOTE — ED Triage Notes (Signed)
PT BIB EMS from street. Pt found walking down street by bystander bleeding from back of head, pt not answering questions appropriately. On EMS arrival pt not giving any information, pt combative. EMS gave 5 of versed and pt restrained. PT has lac to back of head, bleeding controlled at this time. VS with EMS  116/50 HR 110 O296% RA

## 2021-11-07 NOTE — ED Notes (Signed)
Unable to get IV on pt - MD notified - Ativan to be given IM At this time

## 2021-11-07 NOTE — ED Notes (Signed)
Pt transported to CT with this RN 

## 2021-11-07 NOTE — ED Provider Notes (Signed)
[facility] EMERGENCY DEPARTMENT Provider Note   CSN: [id number] Arrival date & time: 11/07/21  2147     History  Chief Complaint  Patient presents with   Head Injury   Aggressive Behavior    restrained    Johnny Hughes is a 26 y.o. male.   Head Injury Patient presents for altered mental status.  EMS was called when bystanders witnessed this patient walking down the road with blood running down the back of his head.  When they arrived on scene, he was confused.  He would answer "no" to questions but would not respond other than that.  No other preceding information is available.  EMS noted dilated pupils on scene.  EMS gave him intramuscular Versed.  Since that time, he has been less agitated.  He remains confused.  Patient is not able to provide any history.     Home Medications Prior to Admission medications   Not on File      Allergies    Patient has no allergy information on record.    Review of Systems   Review of Systems  Unable to perform ROS: Mental status change    Physical Exam Updated Vital Signs BP 119/67   Pulse 66   Temp 97.9 F (36.6 C) (Axillary)   Resp (!) 21   Ht 5\' 8"  (1.727 m)   Wt 59 kg   SpO2 99%   BMI 19.77 kg/m  Physical Exam Vitals and nursing note reviewed.  Constitutional:      General: He is not in acute distress.    Appearance: He is well-developed and normal weight. He is diaphoretic. He is not ill-appearing or toxic-appearing.  HENT:     Head: Normocephalic.     Comments: 2 cm laceration to occipital scalp.  Hemostatic.    Right Ear: External ear normal.     Left Ear: External ear normal.     Nose: Nose normal.     Mouth/Throat:     Mouth: Mucous membranes are moist.  Eyes:     Conjunctiva/sclera: Conjunctivae normal.     Comments: Pupils 3 mm bilaterally and sluggish  Cardiovascular:     Rate and Rhythm: Normal rate and regular rhythm.     Heart sounds: No murmur heard. Pulmonary:     Effort:  Pulmonary effort is normal. No respiratory distress.     Breath sounds: Normal breath sounds. No wheezing or rales.  Chest:     Chest wall: No tenderness.  Abdominal:     General: There is no distension.     Palpations: Abdomen is soft.     Tenderness: There is no abdominal tenderness.  Musculoskeletal:        General: No swelling. Normal range of motion.     Cervical back: Normal range of motion and neck supple. No rigidity.     Right lower leg: No edema.     Left lower leg: No edema.  Skin:    General: Skin is warm.     Capillary Refill: Capillary refill takes less than 2 seconds.     Coloration: Skin is not jaundiced or pale.     Comments: Circular skin lesions to lower extremities  Neurological:     General: No focal deficit present.     Mental Status: He is alert. He is disoriented.     Motor: No weakness.  Psychiatric:        Speech: He is noncommunicative.  Behavior: Behavior is uncooperative and agitated.     ED Results / Procedures / Treatments   Labs (all labs ordered are listed, but only abnormal results are displayed) Labs Reviewed  CBC WITH DIFFERENTIAL/PLATELET - Abnormal; Notable for the following components:      Result Value   WBC 12.5 (*)    Neutro Abs 9.3 (*)    All other components within normal limits  PROTIME-INR  COMPREHENSIVE METABOLIC PANEL  RAPID URINE DRUG SCREEN, HOSP PERFORMED  ETHANOL  URINALYSIS, ROUTINE W REFLEX MICROSCOPIC  MAGNESIUM  CBG MONITORING, ED    EKG EKG Interpretation  Date/Time:  Tuesday November 07 2021 22:30:13 EDT Ventricular Rate:  73 PR Interval:  165 QRS Duration: 84 QT Interval:  368 QTC Calculation: 406 R Axis:   78 Text Interpretation: Sinus rhythm Atrial premature complex Confirmed by Godfrey Pick ([id number]) on 11/07/2021 10:55:45 PM  Radiology No results found.  Procedures Ultrasound ED Peripheral IV (Provider)  Date/Time: 11/07/2021 11:36 PM  Performed by: Godfrey Pick, MD Authorized by: Godfrey Pick,  MD   Procedure details:    Indications: multiple failed IV attempts     Skin Prep: chlorhexidine gluconate     Location:  Right AC   Angiocath:  20 G   Bedside Ultrasound Guided: Yes     Images: not archived     Patient tolerated procedure without complications: Yes     Dressing applied: Yes       Medications Ordered in ED Medications  lactated ringers bolus 1,000 mL (1,000 mLs Intravenous New Bag/Given 11/07/21 2315)  LORazepam (ATIVAN) injection 2 mg (2 mg Intravenous Given 11/07/21 2240)    ED Course/ Medical Decision Making/ A&P                           Medical Decision Making Amount and/or Complexity of Data Reviewed Labs: ordered. Radiology: ordered.  Risk Prescription drug management.   This patient presents to the ED for concern of altered mental status, this involves an extensive number of treatment options, and is a complaint that carries with it a high risk of complications and morbidity.  The differential diagnosis includes concussion, ICH, intoxication, polysubstance abuse, metabolic derangements   Co morbidities that complicate the patient evaluation  Unknown   Additional history obtained:  Additional history obtained from EMS External records from outside source obtained and reviewed including N/A   Lab Tests:  I Ordered, and personally interpreted labs.  The pertinent results include: Glucose, mild leukocytosis, (remaining lab work pending at time of signout)   Imaging Studies ordered:  I ordered imaging studies including CT of head and cervical spine, chest x-ray I independently visualized and interpreted imaging which showed (pending at time of signout) I agree with the radiologist interpretation   Cardiac Monitoring: / EKG:  The patient was maintained on a cardiac monitor.  I personally viewed and interpreted the cardiac monitored which showed an underlying rhythm of: Sinus rhythm   Problem List / ED Course / Critical interventions /  Medication management  Patient is a young adult male who presents to the emergency department by EMS for altered mental status and head injury.  911 was called by bystanders when patient was observed walking on the street with blood running down the back of his head.  He was unable to provide history to EMS.  EMS did give intramuscular Versed for agitation.  On arrival in the ED, patient is drowsy but arousable.  When awakened, he tells people to "get off of him".  He remains confused.  Patient has a 2 cm laceration to the back of his head which is hemostatic at this time.  He has no other external evidence of acute injuries.  He does have nonspecific skin lesions throughout his lower extremities.  His appearance is unkempt.  I suspect that the etiology of his altered mental state is intoxication and head injury.  CT scan of head to be obtained as soon as possible to rule out intracranial injury.  Additional 2 mg of Ativan given to assist in acquisition of CT scan.  Care of patient was signed out to oncoming ED provider. I ordered medication including Ativan for agitation Reevaluation of the patient after these medicines showed that the patient improved I have reviewed the patients home medicines and have made adjustments as needed   Social Determinants of Health:  Unknown  CRITICAL CARE Performed by: Godfrey Pick   Total critical care time: 34 minutes  Critical care time was exclusive of separately billable procedures and treating other patients.  Critical care was necessary to treat or prevent imminent or life-threatening deterioration.  Critical care was time spent personally by me on the following activities: development of treatment plan with patient and/or surrogate as well as nursing, discussions with consultants, evaluation of patient's response to treatment, examination of patient, obtaining history from patient or surrogate, ordering and performing treatments and interventions, ordering  and review of laboratory studies, ordering and review of radiographic studies, pulse oximetry and re-evaluation of patient's condition.         Final Clinical Impression(s) / ED Diagnoses Final diagnoses:  Injury of head, initial encounter  Disorientation    Rx / DC Orders ED Discharge Orders     None         Godfrey Pick, MD 11/07/21 2338

## 2021-11-07 NOTE — ED Notes (Signed)
Pt will not sit still for CT - MD asked for more meds - awaiting orders at this time

## 2021-11-08 ENCOUNTER — Inpatient Hospital Stay (HOSPITAL_COMMUNITY): Payer: Self-pay

## 2021-11-08 ENCOUNTER — Emergency Department (HOSPITAL_COMMUNITY): Payer: Self-pay

## 2021-11-08 DIAGNOSIS — R451 Restlessness and agitation: Secondary | ICD-10-CM | POA: Insufficient documentation

## 2021-11-08 DIAGNOSIS — T50904A Poisoning by unspecified drugs, medicaments and biological substances, undetermined, initial encounter: Secondary | ICD-10-CM

## 2021-11-08 DIAGNOSIS — G9340 Encephalopathy, unspecified: Secondary | ICD-10-CM

## 2021-11-08 DIAGNOSIS — J9600 Acute respiratory failure, unspecified whether with hypoxia or hypercapnia: Secondary | ICD-10-CM

## 2021-11-08 DIAGNOSIS — T50901A Poisoning by unspecified drugs, medicaments and biological substances, accidental (unintentional), initial encounter: Secondary | ICD-10-CM | POA: Diagnosis present

## 2021-11-08 DIAGNOSIS — S0990XA Unspecified injury of head, initial encounter: Secondary | ICD-10-CM

## 2021-11-08 LAB — CBC
HCT: 38.4 % — ABNORMAL LOW (ref 39.0–52.0)
Hemoglobin: 13.4 g/dL (ref 13.0–17.0)
MCH: 30.5 pg (ref 26.0–34.0)
MCHC: 34.9 g/dL (ref 30.0–36.0)
MCV: 87.5 fL (ref 80.0–100.0)
Platelets: 179 10*3/uL (ref 150–400)
RBC: 4.39 MIL/uL (ref 4.22–5.81)
RDW: 12.6 % (ref 11.5–15.5)
WBC: 7.9 10*3/uL (ref 4.0–10.5)
nRBC: 0 % (ref 0.0–0.2)

## 2021-11-08 LAB — GLUCOSE, CAPILLARY: Glucose-Capillary: 113 mg/dL — ABNORMAL HIGH (ref 70–99)

## 2021-11-08 LAB — POCT I-STAT 7, (LYTES, BLD GAS, ICA,H+H)
Acid-base deficit: 1 mmol/L (ref 0.0–2.0)
Bicarbonate: 24.6 mmol/L (ref 20.0–28.0)
Calcium, Ion: 1.29 mmol/L (ref 1.15–1.40)
HCT: 37 % — ABNORMAL LOW (ref 39.0–52.0)
Hemoglobin: 12.6 g/dL — ABNORMAL LOW (ref 13.0–17.0)
O2 Saturation: 96 %
Potassium: 3.2 mmol/L — ABNORMAL LOW (ref 3.5–5.1)
Sodium: 140 mmol/L (ref 135–145)
TCO2: 26 mmol/L (ref 22–32)
pCO2 arterial: 41.8 mmHg (ref 32–48)
pH, Arterial: 7.378 (ref 7.35–7.45)
pO2, Arterial: 86 mmHg (ref 83–108)

## 2021-11-08 LAB — URINALYSIS, ROUTINE W REFLEX MICROSCOPIC
Bacteria, UA: NONE SEEN
Bilirubin Urine: NEGATIVE
Glucose, UA: NEGATIVE mg/dL
Hgb urine dipstick: NEGATIVE
Ketones, ur: NEGATIVE mg/dL
Leukocytes,Ua: NEGATIVE
Nitrite: NEGATIVE
Protein, ur: NEGATIVE mg/dL
Specific Gravity, Urine: 1.025 (ref 1.005–1.030)
pH: 6 (ref 5.0–8.0)

## 2021-11-08 LAB — MAGNESIUM: Magnesium: 2.1 mg/dL (ref 1.7–2.4)

## 2021-11-08 LAB — I-STAT ARTERIAL BLOOD GAS, ED
Acid-Base Excess: 0 mmol/L (ref 0.0–2.0)
Bicarbonate: 24.5 mmol/L (ref 20.0–28.0)
Calcium, Ion: 1.25 mmol/L (ref 1.15–1.40)
HCT: 37 % — ABNORMAL LOW (ref 39.0–52.0)
Hemoglobin: 12.6 g/dL — ABNORMAL LOW (ref 13.0–17.0)
O2 Saturation: 100 %
Patient temperature: 96.7
Potassium: 3.3 mmol/L — ABNORMAL LOW (ref 3.5–5.1)
Sodium: 138 mmol/L (ref 135–145)
TCO2: 26 mmol/L (ref 22–32)
pCO2 arterial: 37.9 mmHg (ref 32–48)
pH, Arterial: 7.414 (ref 7.35–7.45)
pO2, Arterial: 197 mmHg — ABNORMAL HIGH (ref 83–108)

## 2021-11-08 LAB — RAPID URINE DRUG SCREEN, HOSP PERFORMED
Amphetamines: NOT DETECTED
Barbiturates: NOT DETECTED
Benzodiazepines: POSITIVE — AB
Cocaine: POSITIVE — AB
Opiates: NOT DETECTED
Tetrahydrocannabinol: NOT DETECTED

## 2021-11-08 LAB — PHOSPHORUS: Phosphorus: 3.5 mg/dL (ref 2.5–4.6)

## 2021-11-08 LAB — LACTIC ACID, PLASMA: Lactic Acid, Venous: 0.7 mmol/L (ref 0.5–1.9)

## 2021-11-08 MED ORDER — PANTOPRAZOLE 2 MG/ML SUSPENSION
40.0000 mg | Freq: Every day | ORAL | Status: DC
  Administered 2021-11-08: 40 mg
  Filled 2021-11-08 (×2): qty 20

## 2021-11-08 MED ORDER — MIDAZOLAM-SODIUM CHLORIDE 100-0.9 MG/100ML-% IV SOLN
0.5000 mg/h | INTRAVENOUS | Status: DC
  Administered 2021-11-08: 7 mg/h via INTRAVENOUS
  Administered 2021-11-08: 2 mg/h via INTRAVENOUS
  Administered 2021-11-08: 8 mg/h via INTRAVENOUS
  Filled 2021-11-08: qty 100

## 2021-11-08 MED ORDER — ORAL CARE MOUTH RINSE: 15.0000 mL | OROMUCOSAL | Status: DC | PRN

## 2021-11-08 MED ORDER — PANTOPRAZOLE SODIUM 40 MG PO TBEC: 40.0000 mg | DELAYED_RELEASE_TABLET | Freq: Every day | ORAL | Status: DC

## 2021-11-08 MED ORDER — POTASSIUM CHLORIDE 10 MEQ/100ML IV SOLN
10.0000 meq | INTRAVENOUS | Status: AC
  Filled 2021-11-08: qty 100

## 2021-11-08 MED ORDER — MIDAZOLAM HCL 2 MG/2ML IJ SOLN
2.0000 mg | INTRAMUSCULAR | Status: DC | PRN
  Administered 2021-11-09 (×2): 4 mg via INTRAVENOUS
  Filled 2021-11-08 (×2): qty 4

## 2021-11-08 MED ORDER — ONDANSETRON HCL 4 MG/2ML IJ SOLN: 4.0000 mg | Freq: Four times a day (QID) | INTRAMUSCULAR | Status: DC | PRN

## 2021-11-08 MED ORDER — PROPOFOL 1000 MG/100ML IV EMUL
0.0000 ug/kg/min | INTRAVENOUS | Status: DC
  Administered 2021-11-08: 50 ug/kg/min via INTRAVENOUS
  Administered 2021-11-08: 30 ug/kg/min via INTRAVENOUS
  Administered 2021-11-08: 15 ug/kg/min via INTRAVENOUS
  Administered 2021-11-08 – 2021-11-09 (×3): 50 ug/kg/min via INTRAVENOUS
  Filled 2021-11-08 (×5): qty 100

## 2021-11-08 MED ORDER — STERILE WATER FOR INJECTION IJ SOLN
INTRAMUSCULAR | Status: AC
  Filled 2021-11-08: qty 10

## 2021-11-08 MED ORDER — POLYETHYLENE GLYCOL 3350 17 G PO PACK: 17.0000 g | PACK | Freq: Every day | ORAL | Status: DC | PRN

## 2021-11-08 MED ORDER — POTASSIUM CHLORIDE 10 MEQ/100ML IV SOLN
10.0000 meq | INTRAVENOUS | Status: AC
  Administered 2021-11-08 (×4): 10 meq via INTRAVENOUS
  Filled 2021-11-08 (×3): qty 100

## 2021-11-08 MED ORDER — POTASSIUM CHLORIDE 20 MEQ PO PACK
20.0000 meq | PACK | ORAL | Status: AC
  Administered 2021-11-08 (×2): 20 meq
  Filled 2021-11-08 (×2): qty 1

## 2021-11-08 MED ORDER — DOCUSATE SODIUM 50 MG/5ML PO LIQD
100.0000 mg | Freq: Two times a day (BID) | ORAL | Status: DC
  Administered 2021-11-08: 100 mg
  Filled 2021-11-08: qty 10

## 2021-11-08 MED ORDER — ORAL CARE MOUTH RINSE
15.0000 mL | OROMUCOSAL | Status: DC
  Administered 2021-11-08 – 2021-11-09 (×8): 15 mL via OROMUCOSAL

## 2021-11-08 MED ORDER — DROPERIDOL 2.5 MG/ML IJ SOLN
2.5000 mg | Freq: Once | INTRAMUSCULAR | Status: AC
  Administered 2021-11-08: 2.5 mg via INTRAVENOUS
  Filled 2021-11-08: qty 2

## 2021-11-08 MED ORDER — DEXMEDETOMIDINE BOLUS VIA INFUSION
1.0000 ug/kg | Freq: Once | INTRAVENOUS | Status: AC
  Administered 2021-11-08: 59 ug via INTRAVENOUS
  Filled 2021-11-08 (×2): qty 59

## 2021-11-08 MED ORDER — DOCUSATE SODIUM 100 MG PO CAPS: 100.0000 mg | ORAL_CAPSULE | Freq: Two times a day (BID) | ORAL | Status: DC | PRN

## 2021-11-08 MED ORDER — MIDAZOLAM-SODIUM CHLORIDE 100-0.9 MG/100ML-% IV SOLN
2.0000 mg/h | INTRAVENOUS | Status: DC
  Administered 2021-11-08: 2 mg/h via INTRAVENOUS

## 2021-11-08 MED ORDER — ETOMIDATE 2 MG/ML IV SOLN
INTRAVENOUS | Status: AC | PRN
  Administered 2021-11-08 (×2): 20 mg via INTRAVENOUS

## 2021-11-08 MED ORDER — VECURONIUM BROMIDE 10 MG IV SOLR
10.0000 mg | Freq: Once | INTRAVENOUS | Status: AC
  Administered 2021-11-08: 10 mg via INTRAVENOUS
  Filled 2021-11-08: qty 10

## 2021-11-08 MED ORDER — POLYETHYLENE GLYCOL 3350 17 G PO PACK
17.0000 g | PACK | Freq: Every day | ORAL | Status: DC
Start: 2021-11-08 — End: 2021-11-09

## 2021-11-08 MED ORDER — MIDAZOLAM-SODIUM CHLORIDE 100-0.9 MG/100ML-% IV SOLN
INTRAVENOUS | Status: AC
  Filled 2021-11-08: qty 100

## 2021-11-08 MED ORDER — DEXMEDETOMIDINE HCL IN NACL 400 MCG/100ML IV SOLN
0.4000 ug/kg/h | INTRAVENOUS | Status: DC
  Administered 2021-11-08: 0.4 ug/kg/h via INTRAVENOUS
  Filled 2021-11-08 (×2): qty 100

## 2021-11-08 MED ORDER — SUCCINYLCHOLINE CHLORIDE 20 MG/ML IJ SOLN
INTRAMUSCULAR | Status: AC | PRN
  Administered 2021-11-08: 100 mg via INTRAVENOUS

## 2021-11-08 MED ORDER — FENTANYL 2500MCG IN NS 250ML (10MCG/ML) PREMIX INFUSION
0.0000 ug/h | INTRAVENOUS | Status: DC
  Administered 2021-11-08: 25 ug/h via INTRAVENOUS
  Filled 2021-11-08: qty 250

## 2021-11-08 MED ORDER — ENOXAPARIN SODIUM 40 MG/0.4ML IJ SOSY
40.0000 mg | PREFILLED_SYRINGE | INTRAMUSCULAR | Status: DC
  Administered 2021-11-08 – 2021-11-12 (×3): 40 mg via SUBCUTANEOUS
  Filled 2021-11-08 (×4): qty 0.4

## 2021-11-08 MED ORDER — CHLORHEXIDINE GLUCONATE CLOTH 2 % EX PADS
6.0000 | MEDICATED_PAD | Freq: Every day | CUTANEOUS | Status: DC
  Administered 2021-11-08 – 2021-11-12 (×6): 6 via TOPICAL

## 2021-11-08 NOTE — ED Provider Notes (Signed)
Signed out to me to follow-up on imaging.  Physical Exam  BP 119/84   Pulse 62   Temp 97.9 F (36.6 C) (Axillary)   Resp 16   Ht 5\' 8"  (1.727 m)   Wt 59 kg   SpO2 100%   BMI 19.77 kg/m   Physical Exam Constitutional:      Appearance: He is ill-appearing and diaphoretic.  HENT:     Head:     Comments: Laceration R occipital scalp Eyes:     Pupils: Pupils are equal, round, and reactive to light.  Cardiovascular:     Rate and Rhythm: Normal rate and regular rhythm.  Pulmonary:     Effort: Pulmonary effort is normal.     Breath sounds: Normal breath sounds.  Abdominal:     Palpations: Abdomen is soft.  Musculoskeletal:        General: Normal range of motion.     Cervical back: Neck supple.  Skin:    Comments: Multiple scabs over body   Neurological:     Comments: Agititated, fighting, not-redirectable      Procedures  .Marland KitchenLaceration Repair  Date/Time: 11/08/2021 3:37 AM  Performed by: Orpah Greek, MD Authorized by: Orpah Greek, MD   Consent:    Consent obtained:  Emergent situation Universal protocol:    Site/side marked: yes     Immediately prior to procedure, a time out was called: yes     Patient identity confirmed:  Hospital-assigned identification number Anesthesia:    Anesthesia method:  None Laceration details:    Location:  Scalp   Scalp location:  Occipital   Length (cm):  2 Pre-procedure details:    Preparation:  Patient was prepped and draped in usual sterile fashion and imaging obtained to evaluate for foreign bodies Treatment:    Irrigation solution:  Sterile saline Skin repair:    Repair method:  Staples   Number of staples:  3 Approximation:    Approximation:  Close Repair type:    Repair type:  Simple Post-procedure details:    Dressing:  Open (no dressing)   Procedure completion:  Tolerated well, no immediate complications .Critical Care  Performed by: Orpah Greek, MD Authorized by: Orpah Greek, MD   Critical care provider statement:    Critical care time (minutes):  30   Critical care was time spent personally by me on the following activities:  Development of treatment plan with patient or surrogate, discussions with consultants, evaluation of patient's response to treatment, examination of patient, ordering and review of laboratory studies, ordering and review of radiographic studies, ordering and performing treatments and interventions, pulse oximetry, re-evaluation of patient's condition and review of old charts   ED Course / MDM    Medical Decision Making Amount and/or Complexity of Data Reviewed Labs: ordered. Radiology: ordered.  Risk Prescription drug management. Decision regarding hospitalization.   Presented to the emergency department after being found walking along the road with a laceration to the back of his head.  He was unable to provide information.  Previous ER team had tried several agents to sedate him unsuccessfully.  Patient given Haldol with minimal effect.  He was given droperidol with minimal effect.  He was then started on Precedex drip with multiple boluses, still agitated fighting.  He was not able to be sedated enough to perform CT to rule out significant head injury.  Patient was therefore intubated and paralyzed for imaging which was negative.  Orpah Greek, MD 11/08/21 [phone]

## 2021-11-08 NOTE — ED Notes (Signed)
Critical care at bedside  

## 2021-11-08 NOTE — Progress Notes (Signed)
Patient hooked up to LTM with non MRI leads. [facility] monitoring.

## 2021-11-08 NOTE — ED Notes (Signed)
Pt continuous to thrash around despite being given numerous meds. MD Pollina at bedside - Decision to intubate at this time

## 2021-11-08 NOTE — Progress Notes (Signed)
RT and RN transported vent patient from ED to [place]. Vital signs stable through out. Report given to unit RT.

## 2021-11-08 NOTE — H&P (Signed)
NAME:  Johnny Hughes, MRN:  [id number], DOB:  05/21/1874, LOS: 0 ADMISSION DATE:  11/07/2021, CONSULTATION DATE:  11/08/21 REFERRING MD:  EDP, CHIEF COMPLAINT:  Agitation   History of Present Illness:  Young male found by bystanders wandering down the street bleeding from the head brought in by EMS.  He was not answering questions and was combative in the ED.  This did not improve with Versed and Ativan and so was intubated.  Labs showed WBC 12.5, K 3.3 and very mildly elevated AST/ALT, UDS cocaine and benzo +, ETOH <10.  PCCM consulted for admission  Pertinent  Medical History  Unknown  Significant Hospital Events: Including procedures, antibiotic start and stop dates in addition to other pertinent events   6/21 admit to PCCM, intubated for combative behavior  Interim History / Subjective:  Still intermittently agitated and bradycardic on Precedex  Objective   Blood pressure 114/81, pulse 61, temperature 97.9 F (36.6 C), temperature source Axillary, resp. rate 18, height 5\' 8"  (1.727 m), weight 59 kg, SpO2 100 %.    Vent Mode: PRVC FiO2 (%):  [40 %] 40 % Set Rate:  [16 bmp] 16 bmp Vt Set:  [540 mL] 540 mL PEEP:  [5 cmH20] 5 cmH20 Plateau Pressure:  [14 cmH20] 14 cmH20  No intake or output data in the 24 hours ending 11/08/21 0305 Filed Weights   11/07/21 2206  Weight: 59 kg   General:  young, disheveled-appearing M intubated and sedated HEENT: MM pink/moist, lac to head stapled, not bleeding Neuro: examined on versed and precedex, unresponsive to voice, withdraws to pain CV: s1s2 bradycardic, no m/r/g PULM:  mechanical breath sounds bilaterally with equal chest rise and no rhonchi or wheezing GI: soft, non-distended  Extremities: warm/dry, no edema  Skin: circular plaques on LE, multiple track marks on arms   Resolved Hospital Problem list     Assessment & Plan:    Acute Encephalopathy and agitation requiring intubation and IV sedation CTH and C-spine  negative Suspect substance OD, cocaine + -still agitated Precedex and Versed, transition to Propofol -Maintain full vent support with SAT/SBT as tolerated -titrate Vent setting to maintain SpO2 greater than or equal to 90%. -HOB elevated 30 degrees. -Plateau pressures less than 30 cm H20.  -Follow chest x-ray, ABG prn.   -Bronchial hygiene and RT/bronchodilator protocol. -no known name or emergency contacts, bottom of shoe had Johnny Hughes written on it   Hypokalemia -replete with 27meqK -repeat labs in AM   Best Practice (right click and "Reselect all SmartList Selections" daily)   Diet/type: NPO DVT prophylaxis: LMWH GI prophylaxis: PPI Lines: N/A Foley:  N/A Code Status:  full code Last date of multidisciplinary goals of care discussion [pending]  Labs   CBC: Recent Labs  Lab 11/07/21 2306 11/08/21 0202  WBC 12.5*  --   NEUTROABS 9.3*  --   HGB 13.7 12.6*  HCT 39.9 37.0*  MCV 89.9  --   PLT 257  --     Basic Metabolic Panel: Recent Labs  Lab 11/07/21 2306 11/08/21 0202  NA 137 138  K 3.6 3.3*  CL 104  --   CO2 24  --   GLUCOSE 96  --   BUN 19  --   CREATININE 0.74  --   CALCIUM 8.4*  --   MG 2.1  --    GFR: Estimated Creatinine Clearance: -6.1 mL/min (by C-G formula based on SCr of 0.74 mg/dL). Recent Labs  Lab 11/07/21 2306  WBC  12.5*    Liver Function Tests: Recent Labs  Lab 11/07/21 2306  AST 47*  ALT 55*  ALKPHOS 57  BILITOT 0.6  PROT 6.2*  ALBUMIN 3.6   No results for input(s): "LIPASE", "AMYLASE" in the last 168 hours. No results for input(s): "AMMONIA" in the last 168 hours.  ABG    Component Value Date/Time   PHART 7.414 11/08/2021 0202   PCO2ART 37.9 11/08/2021 0202   PO2ART 197 (H) 11/08/2021 0202   HCO3 24.5 11/08/2021 0202   TCO2 26 11/08/2021 0202   O2SAT 100 11/08/2021 0202     Coagulation Profile: Recent Labs  Lab 11/07/21 2306  INR 1.0    Cardiac Enzymes: No results for input(s): "CKTOTAL", "CKMB",  "CKMBINDEX", "TROPONINI" in the last 168 hours.  HbA1C: No results found for: "HGBA1C"  CBG: Recent Labs  Lab 11/07/21 2247  GLUCAP 89    Review of Systems:   Unable to obtain  Past Medical History:  He,  has no past medical history on file.   Surgical History:    Social History:      Family History:  His family history is not on file.   Allergies Not on File   Home Medications  Prior to Admission medications   Not on File     Critical care time:  35 minutes    CRITICAL CARE Performed by: Otilio Carpen Cole [place]   Total critical care time: 35 minutes  Critical care time was exclusive of separately billable procedures and treating other patients.  Critical care was necessary to treat or prevent imminent or life-threatening deterioration.  Critical care was time spent personally by me on the following activities: development of treatment plan with patient and/or surrogate as well as nursing, discussions with consultants, evaluation of patient's response to treatment, examination of patient, obtaining history from patient or surrogate, ordering and performing treatments and interventions, ordering and review of laboratory studies, ordering and review of radiographic studies, pulse oximetry and re-evaluation of patient's condition.  Otilio Carpen Yui Mulvaney, PA-C [facility] See Amion for pager If no response to pager , please call [phone] until 7pm After 7:00 pm call Elink  [phone]?[place]

## 2021-11-08 NOTE — ED Notes (Signed)
LR bolus does not need to be completed at this time per MD Pollina

## 2021-11-08 NOTE — Progress Notes (Signed)
EEG complete - results pending 

## 2021-11-08 NOTE — ED Notes (Signed)
Pt continues to be agitated and restless - Propofol titrated at this time

## 2021-11-08 NOTE — ED Notes (Addendum)
Pt head to be stapled my MD Pollina, pt started to get agitated and vomited - Pt suctioned and meds bolused by Stanton Kidney RN verbal per MD Pollina - RT called to come and suction ET tube - Pt OG still having output.

## 2021-11-08 NOTE — ED Notes (Signed)
Verified with Jeneen Rinks at pharmacy that propofol and versed are compatible

## 2021-11-08 NOTE — TOC Progression Note (Signed)
Transition of Care [facility]) - Progression Note    Patient Details  Name: Romilda Joy MRN: [id number] Date of Birth: 05/21/1874  [facility] of [facility]) CM/SW Contact  Zenon Mayo, RN Phone Number: 11/08/2021, 10:50 AM  Clinical Narrative:    Patient here with acute encephalopathy and agitation requiring intubation and IV sedation. TOC will continue to follow.         Expected Discharge Plan and Services                                                 Social Determinants of Health (SDOH) Interventions    Readmission Risk Interventions     No data to display

## 2021-11-08 NOTE — Procedures (Signed)
Patient Name: Johnny Hughes  MRN: [id number]  Epilepsy Attending: Lora Havens  Referring Physician/Provider: Jacky Kindle, MD  Date: 11/08/2021 Duration: 29.41 mins  Patient history: Unidentified male presented with altered mental status with acute intoxication.  UDS positive for cocaine and benzos. EEG to evaluate for seizure.  Level of alertness: comatose  AEDs during EEG study: Propofol, Versed  Technical aspects: This EEG study was done with scalp electrodes positioned according to the 10-20 International system of electrode placement. Electrical activity was acquired at a sampling rate of 500Hz  and reviewed with a high frequency filter of 70Hz  and a low frequency filter of 1Hz . EEG data were recorded continuously and digitally stored.   Description: EEG showed continuous generalized 2 to 3 Hz delta slowing admixed with an excessive amount of 15 to 18 Hz beta activity  distributed symmetrically and diffusely. Hyperventilation and photic stimulation were not performed.     ABNORMALITY - Continuous slow, generalized - Excessive beta, generalized  IMPRESSION: This study is suggestive of severe diffuse encephalopathy, nonspecific etiology but likely related to sedation. The excessive beta activity seen in the background is most likely due to the effect of benzodiazepine and is a benign EEG pattern. No seizures or epileptiform discharges were seen throughout the recording.  Johnny Hughes

## 2021-11-08 NOTE — Progress Notes (Signed)
eLink Physician-Brief Progress Note Patient Name: Peggye Ley Doe DOB: 05/21/1874 MRN: [id number]   Date of Service  11/08/2021  HPI/Events of Note  Unidentified male presented with altered mental status with acute intoxication.  UDS positive for cocaine and benzos.  Required intubation for combativeness.  eICU Interventions  Chart reviewed     Intervention Category Evaluation Type: New Patient Evaluation  Mauri Brooklyn, P 11/08/2021, 5:14 AM

## 2021-11-08 NOTE — Sedation Documentation (Signed)
Pt tubed by PA Sanders - 7.5 tube 23 at lip - positive color change - breath sounds clear  Precedex restarted

## 2021-11-08 NOTE — ED Notes (Addendum)
Pt taken to CT with this RN and RT - Restraints removed at this time

## 2021-11-08 NOTE — ED Notes (Signed)
Pt continues to be combative with staff - despite meds and multiple staff members holding him down - Multiple boluses of Precedex given per verbal orders of MD Pollina, given by Stanton Kidney RN - RSI meds to be given so that pt can be sedated

## 2021-11-08 NOTE — ED Notes (Signed)
Per verbal orders of MD Pollina max pts Precedex gtt rate

## 2021-11-08 NOTE — ED Notes (Signed)
Precedex started by [facility] RN

## 2021-11-08 NOTE — ED Notes (Addendum)
Pt occasionally bradycardic down to 31 - MD Pollina notified - Precedex titrated down

## 2021-11-08 NOTE — Progress Notes (Signed)
eLink Physician-Brief Progress Note Patient Name: Johnny Hughes DOB: 05/21/1874 MRN: [id number]   Date of Service  11/08/2021  HPI/Events of Note  Patient with extreme agitation, he came very close to self-extubation despite 4 point restraints. Patient vomited about the same time.  eICU Interventions  Fentanyl gtt added to Propofol gtt, and PRN Versed ordered for agitation / delirium. Stat portable CXR ordered to check ET tube position and r/o aspiration.        Frederik Pear 11/08/2021, 7:54 PM

## 2021-11-08 NOTE — Progress Notes (Signed)
RT called to room by RN due to patient waking up and pulling ETT out a couple cm; patient vomited. RT advanced ETT, suctioned copious amount of vomit out of mouth ~50cc from ETT .  RT advanced ETT with bilateral breath sounds auscultated.  CXR pending.

## 2021-11-08 NOTE — ED Provider Notes (Signed)
INTUBATION Performed by: Larene Pickett  Required items: required blood products, implants, devices, and special equipment available Patient identity confirmed: provided demographic data and hospital-assigned identification number Time out: Immediately prior to procedure a "time out" was called to verify the correct patient, procedure, equipment, support staff and site/side marked as required.  Indications: airway protection, combative  Intubation method: Glidescope Laryngoscopy   Preoxygenation: BVM  Sedatives: Etomidate Paralytic: Succinylcholine  Tube Size: 7.5 cuffed  Post-procedure assessment: chest rise and ETCO2 monitor Breath sounds: equal and absent over the epigastrium Tube secured with: ETT holder Chest x-ray interpreted by radiologist and me.  Chest x-ray findings: endotracheal tube in appropriate position  Patient tolerated the procedure well with no immediate complications.     Larene Pickett, PA-C 11/08/21 0142    Orpah Greek, MD 11/08/21 [phone]

## 2021-11-08 NOTE — ED Notes (Signed)
Pt maxed on propofol with no changes in agitation - pt continues to move around in bed - asked for propofol max to be increased, awaiting to hear from provider
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
## 2021-11-08 NOTE — Progress Notes (Signed)
[facility] ADULT ICU REPLACEMENT PROTOCOL   The patient does apply for the [facility] Adult ICU Electrolyte Replacment Protocol based on the criteria listed below:   1.Exclusion criteria: TCTS patients, ECMO patients, and Dialysis patients 2. Is GFR >/= 30 ml/min? Yes.    Patient's GFR today is >60 3. Is SCr </= 2? Yes.   Patient's SCr is 0.74 mg/dL 4. Did SCr increase >/= 0.5 in 24 hours? No. 5.Pt's weight >40kg  Yes.   6. Abnormal electrolyte(s): K+ 3.2  7. Electrolytes replaced per protocol 8.  Call MD STAT for K+ </= 2.5, Phos </= 1, or Mag </= 1 Physician:  n/a  Johnny Hughes 11/08/2021 6:54 AM

## 2021-11-08 NOTE — ED Notes (Signed)
MD Pollina trying to obtain second IV on pt - when pt is stuck with needle pt flails around in bed and tries to pull at medical equipment. Since pt is intermittently bradycardic have Precedex remain at same rate but reapply restraints per MD Pollina

## 2021-11-08 NOTE — Progress Notes (Signed)
Patients identity currently unknown. RN Altha Harm informed CSW [employer] currently finger printing patient to help identify patient. TOC will continue to follow.

## 2021-11-08 NOTE — Progress Notes (Signed)
Transition of Care [facility]) - CAGE-AID Screening   Patient Details  Name: Johnny Hughes MRN: [id number] Date of Birth: 05/21/1874  Transition of Care [facility]) CM/SW Contact:    Elvina Sidle, RN Trauma Response Nurse Phone Number: [phone] 11/08/2021, 4:05 PM   CAGE-AID Screening: Substance Abuse Screening unable to be completed due to: : Patient unable to participate (pt is intubated, unable to participate)

## 2021-11-09 DIAGNOSIS — N186 End stage renal disease: Secondary | ICD-10-CM

## 2021-11-09 LAB — COMPREHENSIVE METABOLIC PANEL
ALT: 55 U/L — ABNORMAL HIGH (ref 0–44)
ALT: 55 U/L — ABNORMAL HIGH (ref 0–44)
AST: 47 U/L — ABNORMAL HIGH (ref 15–41)
AST: 25 U/L (ref 15–41)
Albumin: 3.6 g/dL (ref 3.5–5.0)
Albumin: 3.7 g/dL (ref 3.5–5.0)
Alkaline Phosphatase: 57 U/L (ref 38–126)
Alkaline Phosphatase: 52 U/L (ref 38–126)
Anion gap: 9 (ref 5–15)
Anion gap: 11 (ref 5–15)
BUN: 19 mg/dL (ref 6–20)
BUN: 10 mg/dL (ref 6–20)
CO2: 24 mmol/L (ref 22–32)
CO2: 22 mmol/L (ref 22–32)
Calcium: 8.4 mg/dL — ABNORMAL LOW (ref 8.9–10.3)
Calcium: 9.1 mg/dL (ref 8.9–10.3)
Chloride: 104 mmol/L (ref 98–111)
Chloride: 102 mmol/L (ref 98–111)
Creatinine, Ser: 0.74 mg/dL (ref 0.61–1.24)
Creatinine, Ser: 0.63 mg/dL (ref 0.61–1.24)
GFR, Estimated: >60 mL/min (ref >60)
GFR, Estimated: >60 mL/min (ref >60)
Glucose, Bld: 96 mg/dL (ref 70–99)
Glucose, Bld: 108 mg/dL — ABNORMAL HIGH (ref 70–99)
Potassium: 3.6 mmol/L (ref 3.5–5.1)
Potassium: 3.5 mmol/L (ref 3.5–5.1)
Sodium: 137 mmol/L (ref 135–145)
Sodium: 135 mmol/L (ref 135–145)
Total Bilirubin: 0.6 mg/dL (ref 0.3–1.2)
Total Bilirubin: 1.4 mg/dL — ABNORMAL HIGH (ref 0.3–1.2)
Total Protein: 6.2 g/dL — ABNORMAL LOW (ref 6.5–8.1)
Total Protein: 6.9 g/dL (ref 6.5–8.1)

## 2021-11-09 LAB — BASIC METABOLIC PANEL
Anion gap: 8 (ref 5–15)
BUN: 12 mg/dL (ref 6–20)
CO2: 21 mmol/L — ABNORMAL LOW (ref 22–32)
Calcium: 9.1 mg/dL (ref 8.9–10.3)
Chloride: 110 mmol/L (ref 98–111)
Creatinine, Ser: 0.56 mg/dL — ABNORMAL LOW (ref 0.61–1.24)
GFR, Estimated: >60 mL/min (ref >60)
Glucose, Bld: 90 mg/dL (ref 70–99)
Potassium: 3.6 mmol/L (ref 3.5–5.1)
Sodium: 139 mmol/L (ref 135–145)

## 2021-11-09 LAB — CBC WITH DIFFERENTIAL/PLATELET
Abs Immature Granulocytes: 0.05 10*3/uL (ref 0.00–0.07)
Basophils Absolute: 0 10*3/uL (ref 0.0–0.1)
Basophils Relative: 0 %
Eosinophils Absolute: 0 10*3/uL (ref 0.0–0.5)
Eosinophils Relative: 0 %
HCT: 43.4 % (ref 39.0–52.0)
Hemoglobin: 15.1 g/dL (ref 13.0–17.0)
Immature Granulocytes: 0 %
Lymphocytes Relative: 7 %
Lymphs Abs: 0.9 10*3/uL (ref 0.7–4.0)
MCH: 30.6 pg (ref 26.0–34.0)
MCHC: 34.8 g/dL (ref 30.0–36.0)
MCV: 88 fL (ref 80.0–100.0)
Monocytes Absolute: 0.5 10*3/uL (ref 0.1–1.0)
Monocytes Relative: 4 %
Neutro Abs: 11.9 10*3/uL — ABNORMAL HIGH (ref 1.7–7.7)
Neutrophils Relative %: 89 %
Platelets: 283 10*3/uL (ref 150–400)
RBC: 4.93 MIL/uL (ref 4.22–5.81)
RDW: 12.5 % (ref 11.5–15.5)
WBC: 13.3 10*3/uL — ABNORMAL HIGH (ref 4.0–10.5)
nRBC: 0 % (ref 0.0–0.2)

## 2021-11-09 LAB — TRIGLYCERIDES: Triglycerides: 78 mg/dL (ref <150)

## 2021-11-09 MED ORDER — POTASSIUM CHLORIDE 20 MEQ PO PACK: 40.0000 meq | PACK | Freq: Once | ORAL | Status: DC

## 2021-11-09 MED ORDER — DEXMEDETOMIDINE HCL IN NACL 400 MCG/100ML IV SOLN
0.4000 ug/kg/h | INTRAVENOUS | Status: DC
  Administered 2021-11-09: 0.4 ug/kg/h via INTRAVENOUS
  Filled 2021-11-09: qty 100

## 2021-11-09 MED ORDER — DOCUSATE SODIUM 100 MG PO CAPS
100.0000 mg | ORAL_CAPSULE | Freq: Two times a day (BID) | ORAL | Status: DC
  Administered 2021-11-09 – 2021-11-12 (×3): 100 mg via ORAL
  Filled 2021-11-09 (×3): qty 1

## 2021-11-09 MED ORDER — ZIPRASIDONE MESYLATE 20 MG IM SOLR
20.0000 mg | Freq: Once | INTRAMUSCULAR | Status: AC
  Administered 2021-11-09: 20 mg via INTRAMUSCULAR
  Filled 2021-11-09: qty 20

## 2021-11-09 MED ORDER — PANTOPRAZOLE SODIUM 40 MG PO TBEC
40.0000 mg | DELAYED_RELEASE_TABLET | Freq: Every day | ORAL | Status: DC
  Administered 2021-11-09 – 2021-11-13 (×5): 40 mg via ORAL
  Filled 2021-11-09 (×5): qty 1

## 2021-11-09 MED ORDER — ORAL CARE MOUTH RINSE: 15.0000 mL | OROMUCOSAL | Status: DC | PRN

## 2021-11-09 MED ORDER — POLYETHYLENE GLYCOL 3350 17 G PO PACK: 17.0000 g | PACK | Freq: Every day | ORAL | Status: DC | PRN

## 2021-11-09 MED ORDER — POLYETHYLENE GLYCOL 3350 17 G PO PACK
17.0000 g | PACK | Freq: Every day | ORAL | Status: DC
  Filled 2021-11-09: qty 1

## 2021-11-09 MED ORDER — POTASSIUM CHLORIDE CRYS ER 20 MEQ PO TBCR
40.0000 meq | EXTENDED_RELEASE_TABLET | Freq: Once | ORAL | Status: AC
  Administered 2021-11-09: 40 meq via ORAL
  Filled 2021-11-09: qty 2

## 2021-11-09 NOTE — Plan of Care (Signed)
  Problem: Safety: Goal: Violent Restraint(s) Outcome: Progressing   Problem: Education: Goal: Knowledge of General Education information will improve Description: Including pain rating scale, medication(s)/side effects and non-pharmacologic comfort measures Outcome: Progressing   Problem: Health Behavior/Discharge Planning: Goal: Ability to manage health-related needs will improve Outcome: Progressing   Problem: Clinical Measurements: Goal: Ability to maintain clinical measurements within normal limits will improve Outcome: Progressing Goal: Will remain free from infection Outcome: Progressing Goal: Diagnostic test results will improve Outcome: Progressing Goal: Respiratory complications will improve Outcome: Progressing Goal: Cardiovascular complication will be avoided Outcome: Progressing   Problem: Activity: Goal: Risk for activity intolerance will decrease Outcome: Progressing   Problem: Nutrition: Goal: Adequate nutrition will be maintained Outcome: Progressing   Problem: Coping: Goal: Level of anxiety will decrease Outcome: Progressing   Problem: Elimination: Goal: Will not experience complications related to bowel motility Outcome: Progressing Goal: Will not experience complications related to urinary retention Outcome: Progressing   Problem: Pain Managment: Goal: General experience of comfort will improve Outcome: Progressing   Problem: Safety: Goal: Ability to remain free from injury will improve Outcome: Progressing   Problem: Skin Integrity: Goal: Risk for impaired skin integrity will decrease Outcome: Progressing   Problem: Safety: Goal: Non-violent Restraint(s) Outcome: Progressing
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
## 2021-11-09 NOTE — Procedures (Signed)
Extubation Procedure Note  Patient Details:   Name: Johnny Hughes DOB: 05/21/1874 MRN: [id number]   Airway Documentation:    Vent end date: 11/09/21 Vent end time: 0855   Evaluation  O2 sats: stable throughout Complications: No apparent complications Patient did tolerate procedure well. Bilateral Breath Sounds: Clear   Yes Patient extubated per order to 4L [place] with no apparent complications. Positive cuff leak noted prior to extubation. Patient is alert, has strong cough, and is able to weakly speak. Vitals are stable and RT will continue to monitor.    Quentin Ore 11/09/2021, 9:01 AM

## 2021-11-09 NOTE — Progress Notes (Signed)
Patient did not leave AMA. Patient was in the shower. RN asked if I would attempt to re-hook patient. I will try if he's calm after the Geodon shot kicks in.

## 2021-11-09 NOTE — Progress Notes (Signed)
Per RN, patient let AMA. [facility] and [facility] Ramal have been notified.
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
## 2021-11-09 NOTE — Progress Notes (Signed)
eLink Physician-Brief Progress Note Patient Name: Joseph Nunez DOB: March 22, 1996 MRN: [id number]   Date of Service  11/09/2021  HPI/Events of Note  Patient much calmer following the dose of Geodon, bedside RN was able to verify that the iv is working.  eICU Interventions  Precedex gtt added to the [facility] for use if agitation recurs.        Migdalia Dk 11/09/2021, 9:26 PM

## 2021-11-09 NOTE — Procedures (Addendum)
Patient Name: Burman Blacksmith  MRN: [id number]  Epilepsy Attending: Charlsie Quest  Referring Physician/Provider: Cheri Fowler, MD  Duration: 11/08/2021 1036 to 11/09/2021 1036   Patient history: Unidentified male presented with altered mental status with acute intoxication.  UDS positive for cocaine and benzos. EEG to evaluate for seizure.   Level of alertness: comatose   AEDs during EEG study: Propofol   Technical aspects: This EEG study was done with scalp electrodes positioned according to the 10-20 International system of electrode placement. Electrical activity was acquired at a sampling rate of 500Hz  and reviewed with a high frequency filter of 70Hz  and a low frequency filter of 1Hz . EEG data were recorded continuously and digitally stored.    Description: EEG showed continuous generalized 2 to 3 Hz delta slowing admixed with an excessive amount of 15 to 18 Hz beta activity  distributed symmetrically and diffusely. Hyperventilation and photic stimulation were not performed.      ABNORMALITY - Continuous slow, generalized - Excessive beta, generalized   IMPRESSION: This study is suggestive of severe diffuse encephalopathy, nonspecific etiology but likely related to sedation. The excessive beta activity seen in the background is most likely due to the effect of medications like benzodiazepine and is a benign EEG pattern. No seizures or epileptiform discharges were seen throughout the recording.   Elani Delph Annabelle Harman

## 2021-11-09 NOTE — Progress Notes (Signed)
vLTM maintenance  all impedances below 10kohms  No skin breakdown

## 2021-11-09 NOTE — Procedures (Deleted)
Central Venous Catheter Insertion Procedure Note  Joseph Nunez  [id number]  08-01-1995  Date:11/09/21  Time:6:27 PM   Provider Performing:Liany Mumpower   Procedure: Insertion of Non-tunneled Central Venous Catheter(36556)with US guidance (37445)    Indication(s) Hemodialysis  Consent Risks of the procedure as well as the alternatives and risks of each were explained to the patient and/or caregiver.  Consent for the procedure was obtained and is signed in the bedside chart  Anesthesia Topical only with 1% lidocaine   Timeout Verified patient identification, verified procedure, site/side was marked, verified correct patient position, special equipment/implants available, medications/allergies/relevant history reviewed, required imaging and test results available.  Sterile Technique Maximal sterile technique including full sterile barrier drape, hand hygiene, sterile gown, sterile gloves, mask, hair covering, sterile ultrasound probe cover (if used).  Procedure Description Area of catheter insertion was cleaned with chlorhexidine and draped in sterile fashion.   With real-time ultrasound guidance a HD catheter was placed into the right femoral vein.  Nonpulsatile blood flow and easy flushing noted in all ports.  The catheter was sutured in place and sterile dressing applied.  Complications/Tolerance None; patient tolerated the procedure well. Chest X-ray is ordered to verify placement for internal jugular or subclavian cannulation.  Chest x-ray is not ordered for femoral cannulation.  EBL Minimal  Specimen(s) None

## 2021-11-10 MED ORDER — HYDROXYZINE HCL 25 MG PO TABS
25.0000 mg | ORAL_TABLET | Freq: Four times a day (QID) | ORAL | Status: DC | PRN
  Administered 2021-11-10 – 2021-11-13 (×8): 25 mg via ORAL
  Filled 2021-11-10 (×8): qty 1

## 2021-11-10 MED ORDER — FOLIC ACID 1 MG PO TABS
1.0000 mg | ORAL_TABLET | Freq: Every day | ORAL | Status: DC
Start: 2021-11-10 — End: 2021-11-13
  Administered 2021-11-10 – 2021-11-13 (×4): 1 mg via ORAL
  Filled 2021-11-10 (×4): qty 1

## 2021-11-10 MED ORDER — CLONIDINE HCL 0.1 MG PO TABS: 0.1000 mg | ORAL_TABLET | Freq: Every day | ORAL | Status: DC

## 2021-11-10 MED ORDER — CLONIDINE HCL 0.1 MG PO TABS
0.1000 mg | ORAL_TABLET | ORAL | Status: DC
  Administered 2021-11-12 – 2021-11-13 (×3): 0.1 mg via ORAL
  Filled 2021-11-10 (×3): qty 1

## 2021-11-10 MED ORDER — LORAZEPAM 1 MG PO TABS
1.0000 mg | ORAL_TABLET | ORAL | Status: AC | PRN
  Administered 2021-11-10: 4 mg via ORAL
  Administered 2021-11-10 (×2): 1 mg via ORAL
  Administered 2021-11-10: 2 mg via ORAL
  Administered 2021-11-11: 3 mg via ORAL
  Administered 2021-11-11: 4 mg via ORAL
  Administered 2021-11-11 (×2): 2 mg via ORAL
  Administered 2021-11-11 – 2021-11-12 (×2): 4 mg via ORAL
  Administered 2021-11-12 (×2): 3 mg via ORAL
  Administered 2021-11-12: 2 mg via ORAL
  Administered 2021-11-13: 4 mg via ORAL
  Filled 2021-11-10: qty 4
  Filled 2021-11-10: qty 3
  Filled 2021-11-10: qty 1
  Filled 2021-11-10: qty 4
  Filled 2021-11-10: qty 1
  Filled 2021-11-10: qty 2
  Filled 2021-11-10 (×3): qty 4
  Filled 2021-11-10: qty 3
  Filled 2021-11-10: qty 2
  Filled 2021-11-10: qty 4
  Filled 2021-11-10: qty 2
  Filled 2021-11-10 (×2): qty 4
  Filled 2021-11-10: qty 2

## 2021-11-10 MED ORDER — THIAMINE HCL 100 MG PO TABS
100.0000 mg | ORAL_TABLET | Freq: Every day | ORAL | Status: DC
Start: 2021-11-10 — End: 2021-11-13
  Administered 2021-11-10 – 2021-11-13 (×4): 100 mg via ORAL
  Filled 2021-11-10 (×4): qty 1

## 2021-11-10 MED ORDER — DICYCLOMINE HCL 20 MG PO TABS: 20.0000 mg | ORAL_TABLET | Freq: Four times a day (QID) | ORAL | Status: DC | PRN

## 2021-11-10 MED ORDER — ADULT MULTIVITAMIN W/MINERALS CH
1.0000 | ORAL_TABLET | Freq: Every day | ORAL | Status: DC
Start: 2021-11-10 — End: 2021-11-13
  Administered 2021-11-10 – 2021-11-13 (×4): 1 via ORAL
  Filled 2021-11-10 (×4): qty 1

## 2021-11-10 MED ORDER — ONDANSETRON 4 MG PO TBDP: 4.0000 mg | ORAL_TABLET | Freq: Four times a day (QID) | ORAL | Status: DC | PRN

## 2021-11-10 MED ORDER — THIAMINE HCL 100 MG/ML IJ SOLN
100.0000 mg | Freq: Every day | INTRAMUSCULAR | Status: DC
Start: 2021-11-10 — End: 2021-11-13

## 2021-11-10 MED ORDER — HALOPERIDOL 5 MG PO TABS
5.0000 mg | ORAL_TABLET | Freq: Three times a day (TID) | ORAL | Status: DC | PRN
  Administered 2021-11-11 – 2021-11-12 (×2): 5 mg via ORAL
  Filled 2021-11-10 (×3): qty 1

## 2021-11-10 MED ORDER — METHOCARBAMOL 500 MG PO TABS
500.0000 mg | ORAL_TABLET | Freq: Three times a day (TID) | ORAL | Status: DC | PRN
  Administered 2021-11-12: 500 mg via ORAL
  Filled 2021-11-10: qty 1

## 2021-11-10 MED ORDER — CLONIDINE HCL 0.1 MG PO TABS
0.1000 mg | ORAL_TABLET | Freq: Four times a day (QID) | ORAL | Status: AC
  Administered 2021-11-10 – 2021-11-11 (×5): 0.1 mg via ORAL
  Filled 2021-11-10 (×5): qty 1

## 2021-11-10 MED ORDER — HALOPERIDOL LACTATE 5 MG/ML IJ SOLN
5.0000 mg | Freq: Three times a day (TID) | INTRAMUSCULAR | Status: DC | PRN
  Administered 2021-11-11: 5 mg via INTRAMUSCULAR
  Filled 2021-11-10: qty 1

## 2021-11-10 MED ORDER — NAPROXEN 250 MG PO TABS: 500.0000 mg | ORAL_TABLET | Freq: Two times a day (BID) | ORAL | Status: DC | PRN

## 2021-11-10 MED ORDER — LORAZEPAM 2 MG/ML IJ SOLN: 0.0000 mg | Freq: Three times a day (TID) | INTRAMUSCULAR | Status: DC

## 2021-11-10 MED ORDER — LOPERAMIDE HCL 2 MG PO CAPS
2.0000 mg | ORAL_CAPSULE | ORAL | Status: DC | PRN
  Administered 2021-11-10 (×2): 4 mg via ORAL
  Filled 2021-11-10 (×2): qty 2

## 2021-11-10 MED ORDER — LORAZEPAM 2 MG/ML IJ SOLN
2.0000 mg | Freq: Four times a day (QID) | INTRAMUSCULAR | Status: DC | PRN
  Administered 2021-11-10: 2 mg via INTRAVENOUS
  Filled 2021-11-10: qty 1

## 2021-11-10 MED ORDER — LORAZEPAM 2 MG/ML IJ SOLN
0.0000 mg | INTRAMUSCULAR | Status: DC
  Filled 2021-11-10: qty 1

## 2021-11-10 MED ORDER — NICOTINE 21 MG/24HR TD PT24
21.0000 mg | MEDICATED_PATCH | Freq: Every day | TRANSDERMAL | Status: DC
  Administered 2021-11-10 – 2021-11-12 (×3): 21 mg via TRANSDERMAL
  Filled 2021-11-10 (×4): qty 1

## 2021-11-10 MED ORDER — LORAZEPAM 2 MG/ML IJ SOLN: 1.0000 mg | INTRAMUSCULAR | Status: AC | PRN

## 2021-11-10 NOTE — Plan of Care (Signed)
Pt remains intermittently agitated, voicing frustration of wanting to leave to smoke.  Nicotine patch is in place.  At times pacing in room and removing self from monitoring system.  Switched back to portable telemetry box with more success.  EEG leads remain on head due to pt agitation when trying to remove them.  When not agitated pt rest comfortably in bed w/ VSS.      While this Joseph Nunez began to push IV ativan for CIWA protocol pt pulled only PIV out.  MD Ghimire made aware and orders placed for  PRN haldol to help aid in line placement if needed.  At this time pt remains compliant with taking all PO medications.    Problem: Education: Goal: Knowledge of General Education information will improve Description: Including pain rating scale, medication(s)/side effects and non-pharmacologic comfort measures Outcome: Progressing

## 2021-11-10 NOTE — Progress Notes (Signed)
At 2051 Nurse notified Dr. Imogene Burn with [facility] that pt is now refusing blood pressure cuff readings to be taken and IV placement even after nurse educated pt.  Will continue to monitor and update team on any changes

## 2021-11-10 NOTE — Procedures (Signed)
Patient Name: Joseph Nunez  MRN: [id number]  Epilepsy Attending: Charlsie Quest  Referring Physician/Provider: Cheri Fowler, MD  Duration: 11/09/2021 1036 to 11/09/2021 2015   Patient history: Unidentified male presented with altered mental status with acute intoxication.  UDS positive for cocaine and benzos. EEG to evaluate for seizure.   Level of alertness: awake, asleep   AEDs during EEG study: None   Technical aspects: This EEG study was done with scalp electrodes positioned according to the 10-20 International system of electrode placement. Electrical activity was acquired at a sampling rate of 500Hz  and reviewed with a high frequency filter of 70Hz  and a low frequency filter of 1Hz . EEG data were recorded continuously and digitally stored.    Description: EEG showed continuous generalized 2 to 3 Hz delta slowing admixed with an excessive amount of 15 to 18 Hz beta activity  distributed symmetrically and diffusely.  Gradually as sedation was weaned off, EEG improved and showed posterior dominant rhythm of 8-9 Hz activity of moderate voltage (25-35 uV) seen predominantly in posterior head regions, symmetric and reactive to eye opening and eye closing. Sleep was characterized by vertex waves, sleep spindles (12 to 14 Hz), maximal frontocentral region. Hyperventilation and photic stimulation were not performed.      ABNORMALITY - Continuous slow, generalized - Excessive beta, generalized   IMPRESSION: This study was initially suggestive of severe diffuse encephalopathy, nonspecific etiology but likely related to sedation. The excessive beta activity seen in the background is most likely due to the effect of medications like benzodiazepine and is a benign EEG pattern.  As sedation was weaned, EEG improved and was within normal limits.  No seizures or epileptiform discharges were seen throughout the recording.   Octavian Godek Annabelle Harman

## 2021-11-10 NOTE — Progress Notes (Signed)
PT Cancellation Note  Patient Details Name: Joseph Nunez MRN: [id number] DOB: 07/13/1995   Cancelled Treatment:    Reason Eval/Treat Not Completed: Patient not medically ready (pt at foot of bed with blanket over head. Per RN not appropriate to attempt yet as confused and combative and staff trying to keep pt calm. Will check on next date)   Cristine Polio 11/10/2021, 7:59 AM Merryl Hacker, PT Acute Rehabilitation Services Office: [phone]

## 2021-11-10 NOTE — Progress Notes (Addendum)
PROGRESS NOTE        PATIENT DETAILS Name: Umberto Egle Age: 26 y.o. Sex: male Date of Birth: 1995/11/03 Admit Date: 11/07/2021 Admitting Physician Oretha Milch, MD PCP:No primary care provider on file.  Brief Summary: Patient is a 26 y.o.  male with reported history of polysubstance abuse-who was brought to the ED with altered mental status-intubated in the ED for airway protection-and subsequently admitted by PCCM to the ICU.  Extubated-and subsequently transferred to [facility] on 6/23.     Significant events: 6/20>> brought to ED by EMS-combative/agitated-intubated-admitted by PCCM. 6/22>> extubated-but severely agitated/confused-requiring Geodon 6/23>> transfer to [facility]-remains confused/agitated.  Significant studies: 6/20>> CXR: No PNA 6/21>> UDS: Positive for cocaine/benzos. 6/21>> CT head: No acute abnormality 6/21>> CT C-spine: No acute abnormality 6/21>>Spot EEG: No seizures-severe diffuse encephalopathy. 6/22>> LTM EEG: No seizures.  Significant microbiology data: None  Procedures: None  Consults: PCCM  Subjective: Answers some questions appropriately-appears confused-agitated-seems to be hallucinating.  Objective: Vitals: Blood pressure 118/81, pulse (!) 214, temperature 98.6 F (37 C), temperature source Axillary, resp. rate (!) 29, height 5\' 8"  (1.727 m), weight 59 kg, SpO2 96 %.   Exam: Gen Exam:not in any distress.  Confused. HEENT:atraumatic, normocephalic Chest: B/L clear to auscultation anteriorly CVS:S1S2 regular Abdomen:soft non tender, non distended Extremities:no edema Neurology: Not cooperative but appears nonfocal. Skin: no rash  Pertinent Labs/Radiology:    Latest Ref Rng & Units 11/09/2021    7:10 AM 11/08/2021    9:07 AM 11/08/2021    5:19 AM  CBC  WBC 4.0 - 10.5 K/uL 13.3  7.9    Hemoglobin 13.0 - 17.0 g/dL 82.9  56.2  13.0   Hematocrit 39.0 - 52.0 % 43.4  38.4  37.0   Platelets 150 - 400 K/uL 283  179       Lab Results  Component Value Date   NA 135 11/09/2021   K 3.5 11/09/2021   CL 102 11/09/2021   CO2 22 11/09/2021      Assessment/Plan: Acute encephalopathy: Initially felt to be due to toxic encephalopathy from cocaine/benzodiazepine intoxication-now likely withdrawing.  Very confused-Per nursing staff-has been spitting and throwing stools at nursing staff.  Has been violent as well.  Required Geodon overnight.  This morning remains confused-seems to be hallucinating.  Given that UDS was positive for cocaine/benzo-concern for withdrawal symptoms-we will start Ativan per CIWA protocol.  If he continues to be severely agitated and not responsive to Ativan-we will need to reconsult PCCM for Precedex infusion.  Acute hypoxic respiratory failure: In the setting of encephalopathy-intubated on admission-extubated on 6/22.  Currently protecting airway.  Watch closely.  BMI: Estimated body mass index is 19.77 kg/m as calculated from the following:   Height as of this encounter: 5\' 8"  (1.727 m).   Weight as of this encounter: 59 kg.   Code status:   Code Status: Full Code   DVT Prophylaxis: enoxaparin (LOVENOX) injection 40 mg Start: 11/08/21 1000 SCDs Start: 11/08/21 0302   Family Communication: Called mother-[phone]-updated over the phone on 6/23  Disposition Plan: Status is: Inpatient Remains inpatient appropriate because: Encephalopathic/delirious-not yet stable for discharge.   Planned Discharge Destination:Home   Diet: Diet Order             Diet regular Room service appropriate? Yes; Fluid consistency: Thin  Diet effective now  Antimicrobial agents: Anti-infectives (From admission, onward)    None        MEDICATIONS: Scheduled Meds:  Chlorhexidine Gluconate Cloth  6 each Topical Daily   docusate sodium  100 mg Oral BID   enoxaparin (LOVENOX) injection  40 mg Subcutaneous Q24H   folic acid  1 mg Oral Daily   LORazepam   0-4 mg Intravenous Q4H   Followed by   Melene Muller ON 11/12/2021] LORazepam  0-4 mg Intravenous Q8H   multivitamin with minerals  1 tablet Oral Daily   pantoprazole  40 mg Oral Daily   thiamine  100 mg Oral Daily   Or   thiamine  100 mg Intravenous Daily   Continuous Infusions:  dexmedetomidine (PRECEDEX) IV infusion 0.4 mcg/kg/hr (11/09/21 2150)   PRN Meds:.dicyclomine, hydrOXYzine, loperamide, LORazepam **OR** LORazepam, LORazepam, methocarbamol, naproxen, ondansetron (ZOFRAN) IV, mouth rinse, polyethylene glycol   I have personally reviewed following labs and imaging studies  LABORATORY DATA: CBC: Recent Labs  Lab 11/07/21 2306 11/08/21 0202 11/08/21 0519 11/08/21 0907 11/09/21 0710  WBC 12.5*  --   --  7.9 13.3*  NEUTROABS 9.3*  --   --   --  11.9*  HGB 13.7 12.6* 12.6* 13.4 15.1  HCT 39.9 37.0* 37.0* 38.4* 43.4  MCV 89.9  --   --  87.5 88.0  PLT 257  --   --  179 283    Basic Metabolic Panel: Recent Labs  Lab 11/07/21 2306 11/08/21 0202 11/08/21 0519 11/08/21 0907 11/09/21 0710  NA 137 138 140 139 135  K 3.6 3.3* 3.2* 3.6 3.5  CL 104  --   --  110 102  CO2 24  --   --  21* 22  GLUCOSE 96  --   --  90 108*  BUN 19  --   --  12 10  CREATININE 0.74  --   --  0.56* 0.63  CALCIUM 8.4*  --   --  9.1 9.1  MG 2.1  --   --  2.1  --   PHOS  --   --   --  3.5  --     GFR: Estimated Creatinine Clearance: 118.8 mL/min (by C-G formula based on SCr of 0.63 mg/dL).  Liver Function Tests: Recent Labs  Lab 11/07/21 2306 11/09/21 0710  AST 47* 25  ALT 55* 55*  ALKPHOS 57 52  BILITOT 0.6 1.4*  PROT 6.2* 6.9  ALBUMIN 3.6 3.7   No results for input(s): "LIPASE", "AMYLASE" in the last 168 hours. No results for input(s): "AMMONIA" in the last 168 hours.  Coagulation Profile: Recent Labs  Lab 11/07/21 2306  INR 1.0    Cardiac Enzymes: No results for input(s): "CKTOTAL", "CKMB", "CKMBINDEX", "TROPONINI" in the last 168 hours.  BNP (last 3 results) No results  for input(s): "PROBNP" in the last 8760 hours.  Lipid Profile: Recent Labs    11/09/21 0710  TRIG 78    Thyroid Function Tests: No results for input(s): "TSH", "T4TOTAL", "FREET4", "T3FREE", "THYROIDAB" in the last 72 hours.  Anemia Panel: No results for input(s): "VITAMINB12", "FOLATE", "FERRITIN", "TIBC", "IRON", "RETICCTPCT" in the last 72 hours.  Urine analysis:    Component Value Date/Time   COLORURINE YELLOW 11/08/2021 0117   APPEARANCEUR HAZY (A) 11/08/2021 0117   LABSPEC 1.025 11/08/2021 0117   PHURINE 6.0 11/08/2021 0117   GLUCOSEU NEGATIVE 11/08/2021 0117   HGBUR NEGATIVE 11/08/2021 0117   BILIRUBINUR NEGATIVE 11/08/2021 0117   KETONESUR NEGATIVE 11/08/2021 0117  PROTEINUR NEGATIVE 11/08/2021 0117   NITRITE NEGATIVE 11/08/2021 0117   LEUKOCYTESUR NEGATIVE 11/08/2021 0117    Sepsis Labs: Lactic Acid, Venous    Component Value Date/Time   LATICACIDVEN 0.7 11/08/2021 0907    MICROBIOLOGY: No results found for this or any previous visit (from the past 240 hour(s)).  RADIOLOGY STUDIES/RESULTS: Overnight EEG with video  Result Date: 11/09/2021 Charlsie Quest, MD     11/10/2021  8:54 AM Patient Name: Vito Backers Doe MRN: [id number] Epilepsy Attending: Charlsie Quest Referring Physician/Provider: Cheri Fowler, MD Duration: 11/08/2021 1036 to 11/09/2021 1036  Patient history: Unidentified male presented with altered mental status with acute intoxication.  UDS positive for cocaine and benzos. EEG to evaluate for seizure.  Level of alertness: comatose  AEDs during EEG study: Propofol  Technical aspects: This EEG study was done with scalp electrodes positioned according to the 10-20 International system of electrode placement. Electrical activity was acquired at a sampling rate of 500Hz  and reviewed with a high frequency filter of 70Hz  and a low frequency filter of 1Hz . EEG data were recorded continuously and digitally stored.  Description: EEG showed continuous generalized  2 to 3 Hz delta slowing admixed with an excessive amount of 15 to 18 Hz beta activity  distributed symmetrically and diffusely. Hyperventilation and photic stimulation were not performed.    ABNORMALITY - Continuous slow, generalized - Excessive beta, generalized  IMPRESSION: This study is suggestive of severe diffuse encephalopathy, nonspecific etiology but likely related to sedation. The excessive beta activity seen in the background is most likely due to the effect of medications like benzodiazepine and is a benign EEG pattern. No seizures or epileptiform discharges were seen throughout the recording.  Charlsie Quest    DG Chest Port 1 View  Result Date: 11/08/2021 CLINICAL DATA:  Recent aspiration and respiratory difficulty, initial encounter EXAM: PORTABLE CHEST 1 VIEW COMPARISON:  Film from earlier in the same day. FINDINGS: Endotracheal tube and gastric catheter are noted in satisfactory position. Cardiac shadow is within normal limits. The lungs are clear bilaterally stable from the prior exam. IMPRESSION: No active disease. Electronically Signed   By: Alcide Clever M.D.   On: 11/08/2021 20:00   EEG adult  Result Date: 11/08/2021 Charlsie Quest, MD     11/08/2021 11:11 AM Patient Name: Burman Blacksmith MRN: [id number] Epilepsy Attending: Charlsie Quest Referring Physician/Provider: Cheri Fowler, MD Date: 11/08/2021 Duration: 29.41 mins Patient history: Unidentified male presented with altered mental status with acute intoxication.  UDS positive for cocaine and benzos. EEG to evaluate for seizure. Level of alertness: comatose AEDs during EEG study: Propofol, Versed Technical aspects: This EEG study was done with scalp electrodes positioned according to the 10-20 International system of electrode placement. Electrical activity was acquired at a sampling rate of 500Hz  and reviewed with a high frequency filter of 70Hz  and a low frequency filter of 1Hz . EEG data were recorded continuously and digitally  stored. Description: EEG showed continuous generalized 2 to 3 Hz delta slowing admixed with an excessive amount of 15 to 18 Hz beta activity  distributed symmetrically and diffusely. Hyperventilation and photic stimulation were not performed.   ABNORMALITY - Continuous slow, generalized - Excessive beta, generalized IMPRESSION: This study is suggestive of severe diffuse encephalopathy, nonspecific etiology but likely related to sedation. The excessive beta activity seen in the background is most likely due to the effect of benzodiazepine and is a benign EEG pattern. No seizures or epileptiform discharges were seen throughout  the recording. Priyanka Annabelle Harman     LOS: 2 days   Jeoffrey Massed, MD  [facility]    To contact the attending provider between 7A-7P or the covering provider during after hours 7P-7A, please log into the web site [email or web address] and access using universal [place] password for that web site. If you do not have the password, please call the hospital operator.  11/10/2021, 8:55 AM

## 2021-11-10 NOTE — Progress Notes (Signed)
On 11/09/21, was called and told this patient left AMA. Patient did not leave AMA; patient was in the shower. Patient was hostile and aggressive with staff to the point security was called. Patient smeared feces all over room. I collected our EEG (originally assuming patient had left) and left. RN staff was preparing Geodon shot prior to me leaving.   I notified Melynda Ripple and [facility] that patient left AMA (before I learned patient hadn't left). I placed a d/c order on patient.   A few minutes ago, I returned upstairs to see if I could restart patient's study. Despite Geodon, patient was still uncooperative and would not let me touch him. Patient kept covering head with blanket. Given patient's earlier aggressive/hostile behavior, I did not feel comfortable trying to fix wires that came off. I made the decision to leave patient d/c.   Attempted to remove wires; patient would not let me touch him still. Wires are still on patient head and RN was informed. Acetone is at bedside but told RN that if patient ripped wires off, that it would not cause harm to patient.   Unable to assess skin check due to patient not letting me remove wires.

## 2021-11-10 NOTE — Progress Notes (Signed)
Team is aware pt does not have IV access.   Nurse educated pt on need for IV access for safety reasons and medications, pt refused.  Continuing to monitor pt.

## 2021-11-11 LAB — COMPREHENSIVE METABOLIC PANEL
ALT: 39 U/L (ref 0–44)
AST: 22 U/L (ref 15–41)
Albumin: 3.6 g/dL (ref 3.5–5.0)
Alkaline Phosphatase: 47 U/L (ref 38–126)
Anion gap: 12 (ref 5–15)
BUN: 9 mg/dL (ref 6–20)
CO2: 20 mmol/L — ABNORMAL LOW (ref 22–32)
Calcium: 8.8 mg/dL — ABNORMAL LOW (ref 8.9–10.3)
Chloride: 105 mmol/L (ref 98–111)
Creatinine, Ser: 0.79 mg/dL (ref 0.61–1.24)
GFR, Estimated: >60 mL/min (ref >60)
Glucose, Bld: 187 mg/dL — ABNORMAL HIGH (ref 70–99)
Potassium: 3.4 mmol/L — ABNORMAL LOW (ref 3.5–5.1)
Sodium: 137 mmol/L (ref 135–145)
Total Bilirubin: 0.4 mg/dL (ref 0.3–1.2)
Total Protein: 6.6 g/dL (ref 6.5–8.1)

## 2021-11-11 LAB — MAGNESIUM: Magnesium: 1.8 mg/dL (ref 1.7–2.4)

## 2021-11-11 LAB — PHOSPHORUS: Phosphorus: 2.6 mg/dL (ref 2.5–4.6)

## 2021-11-11 MED ORDER — MAGNESIUM OXIDE -MG SUPPLEMENT 400 (240 MG) MG PO TABS
400.0000 mg | ORAL_TABLET | Freq: Every day | ORAL | Status: AC
Start: 2021-11-11 — End: 2021-11-12
  Administered 2021-11-11 – 2021-11-12 (×2): 400 mg via ORAL
  Filled 2021-11-11 (×2): qty 1

## 2021-11-11 MED ORDER — POTASSIUM CHLORIDE CRYS ER 20 MEQ PO TBCR
40.0000 meq | EXTENDED_RELEASE_TABLET | Freq: Once | ORAL | Status: AC
  Administered 2021-11-11: 40 meq via ORAL
  Filled 2021-11-11: qty 2

## 2021-11-11 NOTE — Consult Note (Signed)
Patient seen as requested but unable to participate in a comprehensive psychiatric evaluation required to determine if he has an underlying psychiatric disorder due to being combative and lethargic but arousable.  Plan/Recommendations: -Continue Haldol 5 mg PO/IM every 8 hr as needed for agitation -Continue Lorazepam detox protocol -Will see patient again on 11/12/21  Thedore Mins, MD Attending psychiatrist

## 2021-11-11 NOTE — Progress Notes (Signed)
PT Cancellation Note  Patient Details Name: Joseph Nunez MRN: [id number] DOB: 04-Aug-1995   Cancelled Treatment:    Reason Eval/Treat Not Completed: Fatigue/lethargy limiting ability to participate. Pt just got back in bed and fell asleep upon PT attempt. RN and PT coordinated to allow pt to sleep at this time and RN can contact PT if pt becomes ready later if time permits.   Raymond Gurney, PT, DPT Acute Rehabilitation Services  Office: [phone]    Jewel Baize 11/11/2021, 3:17 PM

## 2021-11-11 NOTE — Progress Notes (Signed)
Pt still refusing bp cuff and continues to remove/refuse EKG wires/stickers. Pt also refuses bed alarm. Dr. Imogene Burn ([facility]) notified and nurse requested for patient to have psyc consult.

## 2021-11-11 NOTE — Progress Notes (Signed)
PROGRESS NOTE        PATIENT DETAILS Name: Joseph Nunez Age: 26 y.o. Sex: male Date of Birth: March 24, 1996 Admit Date: 11/07/2021 Admitting Physician Oretha Milch, MD PCP:No primary care provider on file.  Brief Summary: Patient is a 26 y.o.  male with reported history of polysubstance abuse-who was brought to the ED with altered mental status-intubated in the ED for airway protection-and subsequently admitted by PCCM to the ICU.  Extubated-and subsequently transferred to [facility] on 6/23.    Significant events: 6/20>> brought to ED by EMS-combative/agitated-intubated-admitted by PCCM. 6/22>> extubated-but severely agitated/confused-requiring Geodon 6/23>> transfer to [facility]-remains confused/agitated.  Significant studies: 6/20>> CXR: No PNA 6/21>> UDS: Positive for cocaine/benzos. 6/21>> CT head: No acute abnormality 6/21>> CT C-spine: No acute abnormality 6/21>>Spot EEG: No seizures-severe diffuse encephalopathy. 6/22>> LTM EEG: No seizures.  Significant microbiology data: None  Procedures: None  Consults: PCCM Psychiatry  Subjective: Less agitated compared to yesterday-answering more questions compared to yesterday.  Consented for blood work.  Per nursing staff-continues to have.'s of agitation/confusion.  Objective: Vitals: Blood pressure 106/67, pulse 65, temperature 98.9 F (37.2 C), temperature source Axillary, resp. rate (!) 26, height 5\' 8"  (1.727 m), weight 59 kg, SpO2 98 %.   Exam: Gen Exam: Still somewhat confused but better than yesterday-not in any distress. HEENT:atraumatic, normocephalic Chest: B/L clear to auscultation anteriorly CVS:S1S2 regular Abdomen:soft non tender, non distended Extremities:no edema Neurology: Non focal Skin: no rash   Pertinent Labs/Radiology:    Latest Ref Rng & Units 11/09/2021    7:10 AM 11/08/2021    9:07 AM 11/08/2021    5:19 AM  CBC  WBC 4.0 - 10.5 K/uL 13.3  7.9    Hemoglobin 13.0 - 17.0  g/dL 56.3  87.5  64.3   Hematocrit 39.0 - 52.0 % 43.4  38.4  37.0   Platelets 150 - 400 K/uL 283  179      Lab Results  Component Value Date   NA 137 11/11/2021   K 3.4 (L) 11/11/2021   CL 105 11/11/2021   CO2 20 (L) 11/11/2021      Assessment/Plan: Acute encephalopathy: Initially felt to be due to toxic encephalopathy from cocaine/benzodiazepine intoxication-currently suspect encephalopathy is mostly from drug withdrawal.  Per his mother (who has talked to yesterday) and patient does confirm-he uses a lot of fentanyl.  UDS was positive for cocaine and benzos.  He is currently on supportive care with tapering Ativan per CIWA protocol and clonidine taper.  He is still having episodes of confusion and agitation but seems to be somewhat better than yesterday.  Patient's mother yesterday claimed that he has been having very erratic behavior for the past few months-and feels that he may have a underlying psychiatric illness.  I have consulted psychiatry for assistance as well.  Hypokalemia: Replete and recheck.  Acute hypoxic respiratory failure: In the setting of encephalopathy-intubated on admission-extubated on 6/22.  Currently protecting airway.  Watch closely.  BMI: Estimated body mass index is 19.77 kg/m as calculated from the following:   Height as of this encounter: 5\' 8"  (1.727 m).   Weight as of this encounter: 59 kg.   Code status:   Code Status: Full Code   DVT Prophylaxis: enoxaparin (LOVENOX) injection 40 mg Start: 11/08/21 1000 SCDs Start: 11/08/21 0302   Family Communication: Mother-Christine-[phone]-updated over the phone on 6/23  Disposition Plan: Status is: Inpatient Remains inpatient appropriate because: Encephalopathic/delirious-not yet stable for discharge.   Planned Discharge Destination:Home   Diet: Diet Order             Diet regular Room service appropriate? Yes; Fluid consistency: Thin  Diet effective now                      Antimicrobial agents: Anti-infectives (From admission, onward)    None        MEDICATIONS: Scheduled Meds:  Chlorhexidine Gluconate Cloth  6 each Topical Daily   cloNIDine  0.1 mg Oral QID   Followed by   Melene Muller ON 11/12/2021] cloNIDine  0.1 mg Oral BH-qamhs   Followed by   Melene Muller ON 11/14/2021] cloNIDine  0.1 mg Oral QAC breakfast   docusate sodium  100 mg Oral BID   enoxaparin (LOVENOX) injection  40 mg Subcutaneous Q24H   folic acid  1 mg Oral Daily   LORazepam  0-4 mg Intravenous Q4H   Followed by   Melene Muller ON 11/12/2021] LORazepam  0-4 mg Intravenous Q8H   multivitamin with minerals  1 tablet Oral Daily   nicotine  21 mg Transdermal Daily   pantoprazole  40 mg Oral Daily   thiamine  100 mg Oral Daily   Or   thiamine  100 mg Intravenous Daily   Continuous Infusions:  dexmedetomidine (PRECEDEX) IV infusion 0.4 mcg/kg/hr (11/09/21 2150)   PRN Meds:.dicyclomine, haloperidol **OR** haloperidol lactate, hydrOXYzine, loperamide, LORazepam **OR** LORazepam, methocarbamol, naproxen, ondansetron (ZOFRAN) IV, mouth rinse, polyethylene glycol   I have personally reviewed following labs and imaging studies  LABORATORY DATA: CBC: Recent Labs  Lab 11/07/21 2306 11/08/21 0202 11/08/21 0519 11/08/21 0907 11/09/21 0710  WBC 12.5*  --   --  7.9 13.3*  NEUTROABS 9.3*  --   --   --  11.9*  HGB 13.7 12.6* 12.6* 13.4 15.1  HCT 39.9 37.0* 37.0* 38.4* 43.4  MCV 89.9  --   --  87.5 88.0  PLT 257  --   --  179 283     Basic Metabolic Panel: Recent Labs  Lab 11/07/21 2306 11/08/21 0202 11/08/21 0519 11/08/21 0907 11/09/21 0710 11/11/21 0747  NA 137 138 140 139 135 137  K 3.6 3.3* 3.2* 3.6 3.5 3.4*  CL 104  --   --  110 102 105  CO2 24  --   --  21* 22 20*  GLUCOSE 96  --   --  90 108* 187*  BUN 19  --   --  12 10 9   CREATININE 0.74  --   --  0.56* 0.63 0.79  CALCIUM 8.4*  --   --  9.1 9.1 8.8*  MG 2.1  --   --  2.1  --  1.8  PHOS  --   --   --  3.5  --  2.6      GFR: Estimated Creatinine Clearance: 118.8 mL/min (by C-G formula based on SCr of 0.79 mg/dL).  Liver Function Tests: Recent Labs  Lab 11/07/21 2306 11/09/21 0710 11/11/21 0747  AST 47* 25 22  ALT 55* 55* 39  ALKPHOS 57 52 47  BILITOT 0.6 1.4* 0.4  PROT 6.2* 6.9 6.6  ALBUMIN 3.6 3.7 3.6    No results for input(s): "LIPASE", "AMYLASE" in the last 168 hours. No results for input(s): "AMMONIA" in the last 168 hours.  Coagulation Profile: Recent Labs  Lab 11/07/21 2306  INR 1.0  Cardiac Enzymes: No results for input(s): "CKTOTAL", "CKMB", "CKMBINDEX", "TROPONINI" in the last 168 hours.  BNP (last 3 results) No results for input(s): "PROBNP" in the last 8760 hours.  Lipid Profile: Recent Labs    11/09/21 0710  TRIG 78     Thyroid Function Tests: No results for input(s): "TSH", "T4TOTAL", "FREET4", "T3FREE", "THYROIDAB" in the last 72 hours.  Anemia Panel: No results for input(s): "VITAMINB12", "FOLATE", "FERRITIN", "TIBC", "IRON", "RETICCTPCT" in the last 72 hours.  Urine analysis:    Component Value Date/Time   COLORURINE YELLOW 11/08/2021 0117   APPEARANCEUR HAZY (A) 11/08/2021 0117   LABSPEC 1.025 11/08/2021 0117   PHURINE 6.0 11/08/2021 0117   GLUCOSEU NEGATIVE 11/08/2021 0117   HGBUR NEGATIVE 11/08/2021 0117   BILIRUBINUR NEGATIVE 11/08/2021 0117   KETONESUR NEGATIVE 11/08/2021 0117   PROTEINUR NEGATIVE 11/08/2021 0117   NITRITE NEGATIVE 11/08/2021 0117   LEUKOCYTESUR NEGATIVE 11/08/2021 0117    Sepsis Labs: Lactic Acid, Venous    Component Value Date/Time   LATICACIDVEN 0.7 11/08/2021 0907    MICROBIOLOGY: No results found for this or any previous visit (from the past 240 hour(s)).  RADIOLOGY STUDIES/RESULTS: No results found.   LOS: 3 days   Jeoffrey Massed, MD  [facility]    To contact the attending provider between 7A-7P or the covering provider during after hours 7P-7A, please log into the web site  [email or web address] and access using universal [place] password for that web site. If you do not have the password, please call the hospital operator.  11/11/2021, 10:35 AM

## 2021-11-11 NOTE — Progress Notes (Signed)
Nurse notified Dr. Carollee Herter at 2100 that pt is refusing all cardiac/vital monitoring and has been educated on the need for these.  Pt correctly answers alert and orientated questions to person, place, situation, time and date.   Pt continues to refuse care, medications, and safety measures. Team is aware.

## 2021-11-12 ENCOUNTER — Encounter (HOSPITAL_COMMUNITY): Payer: Self-pay | Admitting: Pulmonary Disease

## 2021-11-12 ENCOUNTER — Other Ambulatory Visit: Payer: Self-pay

## 2021-11-12 DIAGNOSIS — F3113 Bipolar disorder, current episode manic without psychotic features, severe: Secondary | ICD-10-CM

## 2021-11-12 DIAGNOSIS — F1424 Cocaine dependence with cocaine-induced mood disorder: Secondary | ICD-10-CM

## 2021-11-12 DIAGNOSIS — F1920 Other psychoactive substance dependence, uncomplicated: Secondary | ICD-10-CM

## 2021-11-12 LAB — BASIC METABOLIC PANEL
Anion gap: 9 (ref 5–15)
BUN: 7 mg/dL (ref 6–20)
CO2: 25 mmol/L (ref 22–32)
Calcium: 9.3 mg/dL (ref 8.9–10.3)
Chloride: 103 mmol/L (ref 98–111)
Creatinine, Ser: 0.82 mg/dL (ref 0.61–1.24)
GFR, Estimated: >60 mL/min (ref >60)
Glucose, Bld: 98 mg/dL (ref 70–99)
Potassium: 4.3 mmol/L (ref 3.5–5.1)
Sodium: 137 mmol/L (ref 135–145)

## 2021-11-12 LAB — HEPATITIS C ANTIBODY: HCV Ab: REACTIVE — AB

## 2021-11-12 LAB — HIV ANTIBODY (ROUTINE TESTING W REFLEX): HIV Screen 4th Generation wRfx: NONREACTIVE

## 2021-11-12 LAB — HEPATITIS B SURFACE ANTIGEN: Hepatitis B Surface Ag: NONREACTIVE

## 2021-11-12 LAB — MRSA NEXT GEN BY PCR, NASAL: MRSA by PCR Next Gen: DETECTED — AB

## 2021-11-12 MED ORDER — HALOPERIDOL LACTATE 5 MG/ML IJ SOLN: 5.0000 mg | Freq: Four times a day (QID) | INTRAMUSCULAR | Status: DC | PRN

## 2021-11-12 MED ORDER — PNEUMOCOCCAL 20-VAL CONJ VACC 0.5 ML IM SUSY
0.5000 mL | PREFILLED_SYRINGE | INTRAMUSCULAR | Status: DC
  Filled 2021-11-12: qty 0.5

## 2021-11-12 MED ORDER — GABAPENTIN 600 MG PO TABS
300.0000 mg | ORAL_TABLET | Freq: Three times a day (TID) | ORAL | Status: DC
  Administered 2021-11-12 – 2021-11-13 (×3): 300 mg via ORAL
  Filled 2021-11-12 (×3): qty 1

## 2021-11-12 MED ORDER — TETANUS-DIPHTH-ACELL PERTUSSIS 5-2.5-18.5 LF-MCG/0.5 IM SUSY
0.5000 mL | PREFILLED_SYRINGE | Freq: Once | INTRAMUSCULAR | Status: AC
  Administered 2021-11-12: 0.5 mL via INTRAMUSCULAR
  Filled 2021-11-12: qty 0.5

## 2021-11-12 MED ORDER — ENSURE ENLIVE PO LIQD
237.0000 mL | Freq: Two times a day (BID) | ORAL | Status: DC
  Administered 2021-11-12 – 2021-11-13 (×2): 237 mL via ORAL

## 2021-11-12 MED ORDER — HALOPERIDOL 5 MG PO TABS
5.0000 mg | ORAL_TABLET | Freq: Four times a day (QID) | ORAL | Status: DC | PRN
  Administered 2021-11-12 – 2021-11-13 (×2): 5 mg via ORAL
  Filled 2021-11-12 (×3): qty 1

## 2021-11-12 NOTE — Progress Notes (Signed)
Nurse, charge nurse Alinda Money) and [facility] Hospitalist Dr. Carollee Herter read the Psychiatric note from today reading that the pt is a 1-1 IVC, and only a threat to self d/t trying to leave the hospital.  Nurse questioned the pt using the suicide screen questions and pt was negative for any thought of self harm, plan or past thoughts of self harm or plan to self harm.  Dr. Carollee Herter agrees pt should be 1-1 IVC and is negative for suicide   Nurse will continue to monitor pt.   Pt continues to refuse cardiac monitoring, vitals, and care. Dr. Imogene Burn aware.

## 2021-11-12 NOTE — Progress Notes (Signed)
PT Cancellation Note  Patient Details Name: Joseph Nunez MRN: [id number] DOB: 07-29-1995   Cancelled Treatment:    Reason Eval/Treat Not Completed: Patient not medically ready.   11/12/2021  Jacinto Halim., PT Acute Rehabilitation Services [phone]  (pager) [phone]  (office)   [name] [name] 11/12/2021, 3:43 PM

## 2021-11-12 NOTE — TOC Progression Note (Signed)
Transition of Care (TOC) - Progression Note   CSW asked to assist with IVC paperwork as pt is a danger to self/others. CSW assisted Dr Jerral Ralph and completed/notarized IVC papers sent to Magistrate and Custody Orders received back from from [place].  CSW has contacted non-emergency/ [employer] to serve. Original papers on paper chart.   Patient Details  Name: Joseph Nunez MRN: [id number] Date of Birth: 01/23/1996  Transition of Care [facility]) CM/SW Contact  Elijah Birk Joesph Fill[place], [place] Phone Number: 11/12/2021, 4:35 PM  Clinical Narrative:            Expected Discharge Plan and Services                                                 Social Determinants of Health (SDOH) Interventions Food Insecurity Interventions: [id number] Referral Housing Interventions: [id number] Referral  Readmission Risk Interventions     No data to display

## 2021-11-12 NOTE — Progress Notes (Addendum)
PROGRESS NOTE        PATIENT DETAILS Name: Joseph Nunez Age: 26 y.o. Sex: male Date of Birth: 10-01-1995 Admit Date: 11/07/2021 Admitting Physician Oretha Milch, MD PCP:No primary care provider on file.  Brief Summary: Patient is a 26 y.o.  male with reported history of polysubstance abuse-who was brought to the ED with altered mental status-intubated in the ED for airway protection-and subsequently admitted by PCCM to the ICU.  Extubated-and subsequently transferred to [facility] on 6/23.    Significant events: 6/20>> brought to ED by EMS-combative/agitated-intubated-admitted by PCCM. 6/22>> extubated-but severely agitated/confused-requiring Geodon 6/23>> transfer to [facility]-remains confused/agitated.  Significant studies: 6/20>> CXR: No PNA 6/21>> UDS: Positive for cocaine/benzos. 6/21>> CT head: No acute abnormality 6/21>> CT C-spine: No acute abnormality 6/21>>Spot EEG: No seizures-severe diffuse encephalopathy. 6/22>> LTM EEG: No seizures.  Significant microbiology data: None  Procedures: None  Consults: PCCM Psychiatry  Subjective: Shivering this morning-asking for pain medications.  Per nursing staff-he was mastrubating last night.  Still pretty uncooperative but much more awake and alert.   Objective: Vitals: Blood pressure 114/83, pulse 92, temperature 98.9 F (37.2 C), temperature source Axillary, resp. rate (!) 36, height 5\' 8"  (1.727 m), weight 59 kg, SpO2 100 %.   Exam: Gen Exam:Alert awake-not in any distress HEENT:atraumatic, normocephalic Chest: B/L clear to auscultation anteriorly CVS:S1S2 regular Abdomen:soft non tender, non distended Extremities:no edema Neurology: Non focal Skin: no rash   Pertinent Labs/Radiology:    Latest Ref Rng & Units 11/09/2021    7:10 AM 11/08/2021    9:07 AM 11/08/2021    5:19 AM  CBC  WBC 4.0 - 10.5 K/uL 13.3  7.9    Hemoglobin 13.0 - 17.0 g/dL 62.9  52.8  41.3   Hematocrit 39.0 - 52.0 %  43.4  38.4  37.0   Platelets 150 - 400 K/uL 283  179      Lab Results  Component Value Date   NA 137 11/12/2021   K 4.3 11/12/2021   CL 103 11/12/2021   CO2 25 11/12/2021      Assessment/Plan: Acute encephalopathy: On initial presentation to the hospital-this was thought to be due to drug intoxication (known history of fentanyl use-UDS positive for cocaine/benzodiazepine).  Postextubation-he remained very confused-this was probably due to drug withdrawals.  He is overall improved-more awake/alert but still with significant behavioral issues (spitting/throwing poop/mastrubating)-have asked psychiatry to reassess today.  Nursing staff to hold sedative medications as much as possible for psychiatric evaluation.  Per patient's mother-patient has had very erratic behavior over the past several months.  In the meantime-continue with detox protocol with Ativan CIWA protocol/clonidine taper and other supportive care.   Hypokalemia: Repleted we will recheck when patient allows phlebotomy.  He has been uncooperative with nursing staff  Acute hypoxic respiratory failure: In the setting of encephalopathy-intubated on admission-extubated on 6/22.  Currently protecting airway.  Watch closely.  Laceration to right occipital scalp: Stapled in the emergency room-spoke with patient-he is not up-to-date with Tdap vaccination-spoke with pharmacy-Tdap to be ordered.  Will need staple removed 1 week from 6/21.  BMI: Estimated body mass index is 19.77 kg/m as calculated from the following:   Height as of this encounter: 5\' 8"  (1.727 m).   Weight as of this encounter: 59 kg.   Code status:   Code Status: Full Code   DVT Prophylaxis:  enoxaparin (LOVENOX) injection 40 mg Start: 11/08/21 1000 SCDs Start: 11/08/21 0302   Family Communication: Mother-Christine-[phone]-updated over the phone on 6/23  Disposition Plan: Status is: Inpatient Remains inpatient appropriate because:  Encephalopathic/delirious-not yet stable for discharge.   Planned Discharge Destination:Home   Diet: Diet Order             Diet regular Room service appropriate? Yes; Fluid consistency: Thin  Diet effective now                     Antimicrobial agents: Anti-infectives (From admission, onward)    None        MEDICATIONS: Scheduled Meds:  Chlorhexidine Gluconate Cloth  6 each Topical Daily   cloNIDine  0.1 mg Oral BH-qamhs   Followed by   Melene Muller ON 11/14/2021] cloNIDine  0.1 mg Oral QAC breakfast   docusate sodium  100 mg Oral BID   enoxaparin (LOVENOX) injection  40 mg Subcutaneous Q24H   folic acid  1 mg Oral Daily   LORazepam  0-4 mg Intravenous Q8H   multivitamin with minerals  1 tablet Oral Daily   nicotine  21 mg Transdermal Daily   pantoprazole  40 mg Oral Daily   Tdap  0.5 mL Intramuscular Once   thiamine  100 mg Oral Daily   Or   thiamine  100 mg Intravenous Daily   Continuous Infusions:   PRN Meds:.dicyclomine, haloperidol **OR** haloperidol lactate, hydrOXYzine, loperamide, LORazepam **OR** LORazepam, methocarbamol, naproxen, ondansetron (ZOFRAN) IV, mouth rinse, polyethylene glycol   I have personally reviewed following labs and imaging studies  LABORATORY DATA: CBC: Recent Labs  Lab 11/07/21 2306 11/08/21 0202 11/08/21 0519 11/08/21 0907 11/09/21 0710  WBC 12.5*  --   --  7.9 13.3*  NEUTROABS 9.3*  --   --   --  11.9*  HGB 13.7 12.6* 12.6* 13.4 15.1  HCT 39.9 37.0* 37.0* 38.4* 43.4  MCV 89.9  --   --  87.5 88.0  PLT 257  --   --  179 283     Basic Metabolic Panel: Recent Labs  Lab 11/07/21 2306 11/08/21 0202 11/08/21 0519 11/08/21 0907 11/09/21 0710 11/11/21 0747 11/12/21 0815  NA 137   < > 140 139 135 137 137  K 3.6   < > 3.2* 3.6 3.5 3.4* 4.3  CL 104  --   --  110 102 105 103  CO2 24  --   --  21* 22 20* 25  GLUCOSE 96  --   --  90 108* 187* 98  BUN 19  --   --  12 10 9 7   CREATININE 0.74  --   --  0.56* 0.63 0.79  0.82  CALCIUM 8.4*  --   --  9.1 9.1 8.8* 9.3  MG 2.1  --   --  2.1  --  1.8  --   PHOS  --   --   --  3.5  --  2.6  --    < > = values in this interval not displayed.     GFR: Estimated Creatinine Clearance: 114.9 mL/min (by C-G formula based on SCr of 0.82 mg/dL).  Liver Function Tests: Recent Labs  Lab 11/07/21 2306 11/09/21 0710 11/11/21 0747  AST 47* 25 22  ALT 55* 55* 39  ALKPHOS 57 52 47  BILITOT 0.6 1.4* 0.4  PROT 6.2* 6.9 6.6  ALBUMIN 3.6 3.7 3.6    No results for input(s): "LIPASE", "AMYLASE" in the last  168 hours. No results for input(s): "AMMONIA" in the last 168 hours.  Coagulation Profile: Recent Labs  Lab 11/07/21 2306  INR 1.0     Cardiac Enzymes: No results for input(s): "CKTOTAL", "CKMB", "CKMBINDEX", "TROPONINI" in the last 168 hours.  BNP (last 3 results) No results for input(s): "PROBNP" in the last 8760 hours.  Lipid Profile: No results for input(s): "CHOL", "HDL", "LDLCALC", "TRIG", "CHOLHDL", "LDLDIRECT" in the last 72 hours.   Thyroid Function Tests: No results for input(s): "TSH", "T4TOTAL", "FREET4", "T3FREE", "THYROIDAB" in the last 72 hours.  Anemia Panel: No results for input(s): "VITAMINB12", "FOLATE", "FERRITIN", "TIBC", "IRON", "RETICCTPCT" in the last 72 hours.  Urine analysis:    Component Value Date/Time   COLORURINE YELLOW 11/08/2021 0117   APPEARANCEUR HAZY (A) 11/08/2021 0117   LABSPEC 1.025 11/08/2021 0117   PHURINE 6.0 11/08/2021 0117   GLUCOSEU NEGATIVE 11/08/2021 0117   HGBUR NEGATIVE 11/08/2021 0117   BILIRUBINUR NEGATIVE 11/08/2021 0117   KETONESUR NEGATIVE 11/08/2021 0117   PROTEINUR NEGATIVE 11/08/2021 0117   NITRITE NEGATIVE 11/08/2021 0117   LEUKOCYTESUR NEGATIVE 11/08/2021 0117    Sepsis Labs: Lactic Acid, Venous    Component Value Date/Time   LATICACIDVEN 0.7 11/08/2021 0907    MICROBIOLOGY: No results found for this or any previous visit (from the past 240 hour(s)).  RADIOLOGY  STUDIES/RESULTS: No results found.   LOS: 4 days   Jeoffrey Massed, MD  [facility]    To contact the attending provider between 7A-7P or the covering provider during after hours 7P-7A, please log into the web site [email or web address] and access using universal [place] password for that web site. If you do not have the password, please call the hospital operator.  11/12/2021, 10:28 AM

## 2021-11-12 NOTE — Consult Note (Addendum)
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 male patient admitted with aggressive behavior and altered mental status.  HPI:  Patient is a 26 y.o. male who reports history of  anxiety, bipolar disorder, polysubstance abuse(opioid, cocaine, ICE, Ecstasy, Xanax, Bath salt, Cannabis) who was brought to the ED with aggressive behavior and altered mental status-intubated in the ED for airway protection-and subsequently admitted by PCCM to the ICU. Patient is partially cooperative with evaluation but he is alert enough to answer questions today. He states that he is poorly compliant with psychiatric medications, he prefers to self medicate with illicit drugs. Patient states that '' I use any illicit drug I can lay my hand on''. Staff nurse reports that patient has attempted to elope from the hospital today. Patient is combative, easily agitated, aggressive, labile, irritable, anxious and tremulous. However, he is requesting to be discharged from the hospital even though he is in withdrawal(Utox is positive for Benzodiazepine and Cocaine on admission). He is clearly craving for  more drugs and he appears to be a danger to himself. Currently, he denies psychosis, delusions and homicidal thoughts. Patient unable to recollect any past history of drug and alcohol rehabilitations.    Past Psychiatric History: as above  Risk to Self:  yes, attempting to leave hospital even though he is in withdrawal Risk to Others:  denies Prior Inpatient Therapy:  multiple in [place] per patient's report Prior Outpatient Therapy:  non-compliant  Past Medical History: No past medical history on file.  The histories are not reviewed yet. Please review them in the "History" navigator section and refresh this SmartLink. Family History: No family history on file.  Family Psychiatric  History: pt refused to answer Social History:  Social History   Substance and Sexual Activity  Alcohol Use Yes     Social History   Substance and Sexual Activity  Drug Use Yes   Types: Amphetamines, Fentanyl, IV, Marijuana, MDMA (Ecstacy), Methamphetamines, Cocaine, Benzodiazepines    Social History   Socioeconomic History   Marital status: Single    Spouse name: Not on file   Number of children: Not on file   Years of education: Not on file   Highest education level: High school graduate  Occupational History   Not on file  Tobacco Use   Smoking status: Every Day    Types: Cigarettes   Smokeless tobacco: Not on file  Substance and Sexual Activity   Alcohol use: Yes   Drug use: Yes    Types: Amphetamines, Fentanyl, IV, Marijuana, MDMA (Ecstacy), Methamphetamines, Cocaine, Benzodiazepines   Sexual activity: Not Currently  Other Topics Concern   Not on file  Social History Narrative   Not on file   Social Determinants of Health   Financial Resource  Strain: Not on file  Food Insecurity: Food Insecurity Present (11/12/2021)   Hunger Vital Sign    Worried About Running Out of Food in the Last Year: Often true    Ran Out of Food in the Last Year: Often true  Transportation Needs: Not on  file  Physical Activity: Not on file  Stress: Not on file  Social Connections: Not on file   Additional Social History:    Allergies:   Allergies  Allergen Reactions   Fish Allergy Hives    Catfish   Suboxone [Buprenorphine Hcl-Naloxone Hcl] Anaphylaxis    Labs:  Results for orders placed or performed during the hospital encounter of 11/07/21 (from the past 48 hour(s))  Comprehensive metabolic panel     Status: Abnormal   Collection Time: 11/11/21  7:47 AM  Result Value Ref Range   Sodium 137 135 - 145 mmol/L   Potassium 3.4 (L) 3.5 - 5.1 mmol/L   Chloride 105 98 - 111 mmol/L   CO2 20 (L) 22 - 32 mmol/L   Glucose, Bld 187 (H) 70 - 99 mg/dL    Comment: Glucose reference range applies only to samples taken after fasting for at least 8 hours.   BUN 9 6 - 20 mg/dL   Creatinine, Ser 4.03 0.61 - 1.24 mg/dL   Calcium 8.8 (L) 8.9 - 10.3 mg/dL   Total Protein 6.6 6.5 - 8.1 g/dL   Albumin 3.6 3.5 - 5.0 g/dL   AST 22 15 - 41 U/L   ALT 39 0 - 44 U/L   Alkaline Phosphatase 47 38 - 126 U/L   Total Bilirubin 0.4 0.3 - 1.2 mg/dL   GFR, Estimated >47 >42 mL/min    Comment: (NOTE) Calculated using the CKD-EPI Creatinine Equation (2021)    Anion gap 12 5 - 15    Comment: Performed at [facility], [address] [place], [place] [address]  Phosphorus     Status: None   Collection Time: 11/11/21  7:47 AM  Result Value Ref Range   Phosphorus 2.6 2.5 - 4.6 mg/dL    Comment: Performed at [facility], [address], [place], [place] [address]  Magnesium     Status: None   Collection Time: 11/11/21  7:47 AM  Result Value Ref Range   Magnesium 1.8 1.7 - 2.4 mg/dL    Comment: Performed at [facility], [address] [place], [place] [address]  Basic metabolic panel     Status: None   Collection Time: 11/12/21  8:15 AM  Result Value Ref Range   Sodium 137 135 - 145 mmol/L   Potassium 4.3 3.5 - 5.1 mmol/L    Comment: DELTA CHECK NOTED   Chloride 103 98 - 111 mmol/L    CO2 25 22 - 32 mmol/L   Glucose, Bld 98 70 - 99 mg/dL    Comment: Glucose reference range applies only to samples taken after fasting for at least 8 hours.   BUN 7 6 - 20 mg/dL   Creatinine, Ser 1.88 0.61 - 1.24 mg/dL   Calcium 9.3 8.9 - 41.6 mg/dL   GFR, Estimated >60 >63 mL/min    Comment: (NOTE) Calculated using the CKD-EPI Creatinine Equation (2021)    Anion gap 9 5 - 15    Comment: Performed at [facility], [address], [place], [place] [address]    Current Facility-Administered Medications  Medication Dose Route Frequency Provider Last Rate Last Admin  Chlorhexidine Gluconate Cloth 2 % PADS 6 each  6 each Topical Daily Cheri Fowler, MD   6 each at 11/12/21 0800   cloNIDine (CATAPRES) tablet 0.1 mg  0.1 mg Oral BH-qamhs Ghimire, Shanker M, MD   0.1 mg at 11/12/21 1007   Followed by   Melene Muller ON 11/14/2021] cloNIDine (CATAPRES) tablet 0.1 mg  0.1 mg Oral QAC breakfast Ghimire, Werner Lean, MD       dicyclomine (BENTYL) tablet 20 mg  20 mg Oral Q6H PRN Ghimire, Werner Lean, MD       docusate sodium (COLACE) capsule 100 mg  100 mg Oral BID Cheri Fowler, MD   100 mg at 11/12/21 1006   enoxaparin (LOVENOX) injection 40 mg  40 mg Subcutaneous Q24H Gleason, Darcella Gasman, PA-C   40 mg at 11/12/21 1007   feeding supplement (ENSURE ENLIVE / ENSURE PLUS) liquid 237 mL  237 mL Oral BID BM Ghimire, Werner Lean, MD       folic acid (FOLVITE) tablet 1 mg  1 mg Oral Daily Ghimire, Shanker M, MD   1 mg at 11/12/21 1006   gabapentin (NEURONTIN) tablet 300 mg  300 mg Oral TID Thedore Mins, MD       haloperidol (HALDOL) tablet 5 mg  5 mg Oral Q8H PRN Maretta Bees, MD   5 mg at 11/12/21 1007   Or   haloperidol lactate (HALDOL) injection 5 mg  5 mg Intramuscular Q8H PRN Maretta Bees, MD   5 mg at 11/11/21 0759   hydrOXYzine (ATARAX) tablet 25 mg  25 mg Oral Q6H PRN Maretta Bees, MD   25 mg at 11/11/21 1947   loperamide (IMODIUM) capsule 2-4 mg  2-4 mg Oral PRN Maretta Bees, MD   4 mg at 11/10/21 2039   LORazepam (ATIVAN) injection 0-4 mg  0-4 mg Intravenous Q8H Ghimire, Werner Lean, MD       LORazepam (ATIVAN) tablet 1-4 mg  1-4 mg Oral Q1H PRN Maretta Bees, MD   2 mg at 11/12/21 8295   Or   LORazepam (ATIVAN) injection 1-4 mg  1-4 mg Intravenous Q1H PRN Maretta Bees, MD       methocarbamol (ROBAXIN) tablet 500 mg  500 mg Oral Q8H PRN Maretta Bees, MD       multivitamin with minerals tablet 1 tablet  1 tablet Oral Daily Maretta Bees, MD   1 tablet at 11/12/21 1006   naproxen (NAPROSYN) tablet 500 mg  500 mg Oral BID PRN Maretta Bees, MD       nicotine (NICODERM CQ - dosed in mg/24 hours) patch 21 mg  21 mg Transdermal Daily Maretta Bees, MD   21 mg at 11/12/21 1006   ondansetron (ZOFRAN) injection 4 mg  4 mg Intravenous Q6H PRN Gleason, Darcella Gasman, PA-C       Oral care mouth rinse  15 mL Mouth Rinse PRN Cheri Fowler, MD       pantoprazole (PROTONIX) EC tablet 40 mg  40 mg Oral Daily Cheri Fowler, MD   40 mg at 11/12/21 1006   [START ON 11/13/2021] pneumococcal 20-valent conjugate vaccine (PREVNAR 20) injection 0.5 mL  0.5 mL Intramuscular Tomorrow-1000 Ghimire, Werner Lean, MD       polyethylene glycol (MIRALAX / GLYCOLAX) packet 17 g  17 g Oral Daily PRN Cheri Fowler, MD       thiamine tablet 100 mg  100 mg Oral Daily Ghimire, Shanker M,  MD   100 mg at 11/12/21 1006   Or   thiamine (B-1) injection 100 mg  100 mg Intravenous Daily Ghimire, Werner Lean, MD        Musculoskeletal: Strength & Muscle Tone: within normal limits Gait & Station: unsteady Patient leans: Front    Psychiatric Specialty Exam:  Presentation  General Appearance: Appropriate for Environment  Eye Contact:Fleeting; Minimal  Speech:Clear and Coherent  Speech Volume:Increased  Handedness:Right   Mood and Affect  Mood:Anxious; Irritable; Labile  Affect:Congruent   Thought Process  Thought Processes:Coherent  Descriptions of  Associations:Intact  Orientation:Full (Time, Place and Person)  Thought Content:Logical  History of Schizophrenia/Schizoaffective disorder:No data recorded Duration of Psychotic Symptoms:No data recorded Hallucinations:Hallucinations: None  Ideas of Reference:None  Suicidal Thoughts:Suicidal Thoughts: -- (patient appears to be a danger to self-trying to elope from hospital even though he appears to be withdrawin from illicit drug use)  Homicidal Thoughts:Homicidal Thoughts: No   Sensorium  Memory:Immediate Good; Recent Fair; Remote Fair  Judgment:Poor  Insight:Poor   Executive Functions  Concentration:Poor  Attention Span:Poor  Recall:Fair  Fund of Knowledge:Fair  Language:Good   Psychomotor Activity  Psychomotor Activity:Psychomotor Activity: Restlessness; Increased   Assets  Assets:Social Support   Sleep  Sleep:Sleep: Poor   Physical Exam: Physical Exam Review of Systems  Psychiatric/Behavioral:  Positive for substance abuse. The patient is nervous/anxious and has insomnia.    Blood pressure 114/83, pulse 92, temperature 98.9 F (37.2 C), temperature source Axillary, resp. rate (!) 36, height 5\' 8"  (1.727 m), weight 59 kg, SpO2 100 %. Body mass index is 19.77 kg/m.  Treatment Plan Summary: 26 year old male with long history of polysubstance dependence and mental issues who is non-complaint with his treatment. Patient has been admitted due to aggressive behavior and altered mental status. Currently, he is in withdrawal from illicit drug used before this  admission. He will benefit from inpatient admission to address his mental health and drug use after he is medically stabilized.  Plan/Recommendations: -Needs 1:1 sitter for safety -Continue Lorazepam, CIWA withdrawal protocol -Continue Haldol 5 mg PO/IM every 8 hr as needed for agitation -Pls monitor EKG  for Qtc prolongation-patient on psychotropics -Add Gabapentin 300 mg TID for  agitation/anxiety/cocaine induced mood disorder -Consider  social worker consult to facilitate inpatient psychiatric admission after patient is medically stabilized -Consider IVC patient due to being a danger to self-attempting to elope from the hospital while he is in withdrawal  Disposition: Recommend psychiatric Inpatient admission when medically cleared. Psychiatric service will continue to follow up with the patient  Thedore Mins, MD 11/12/2021 12:02 PM

## 2021-11-13 DIAGNOSIS — F1424 Cocaine dependence with cocaine-induced mood disorder: Secondary | ICD-10-CM

## 2021-11-13 MED ORDER — LORAZEPAM 1 MG PO TABS
0.0000 mg | ORAL_TABLET | Freq: Three times a day (TID) | ORAL | Status: DC
  Administered 2021-11-13: 2 mg via ORAL
  Filled 2021-11-13: qty 2

## 2021-11-13 MED ORDER — ALPRAZOLAM 0.5 MG PO TABS
0.5000 mg | ORAL_TABLET | Freq: Three times a day (TID) | ORAL | Status: DC | PRN
Start: 2021-11-13 — End: 2021-11-13
  Administered 2021-11-13 (×2): 0.5 mg via ORAL
  Filled 2021-11-13 (×2): qty 1

## 2021-11-13 MED ORDER — NICOTINE 14 MG/24HR TD PT24
28.0000 mg | MEDICATED_PATCH | Freq: Every day | TRANSDERMAL | Status: DC
  Administered 2021-11-13: 28 mg via TRANSDERMAL
  Filled 2021-11-13: qty 2

## 2021-11-13 NOTE — Progress Notes (Signed)
PT Cancellation Note  Patient Details Name: Sakib Bradway MRN: [id number] DOB: [birth year]   Cancelled Treatment:    Reason Eval/Treat Not Completed: PT screened, no needs identified, will sign off. Pt remains combative, but moving independently. MD aware.  Ina Homes, PT, DPT Acute Rehabilitation Services  Pager [phone] Office [phone]  Malachy Chamber 11/13/2021, 8:41 AM

## 2021-11-13 NOTE — Discharge Summary (Addendum)
PATIENT DETAILS Name: Joseph Nunez Age: 26 y.o. Sex: male Date of Birth: Jan 07, 1996 MRN: [id number]. Admitting Physician: Oretha Milch, MD PCP:No primary care provider on file.  Admit Date: 11/07/2021 Discharge date: 11/13/2021  Recommendations for Outpatient Follow-up:  Follow up with PCP in 1-2 weeks Please obtain CMP/CBC in one week Please ensure follow up with outpatient psychiatry HCV Viral load pending-please follow  Admitted From:  Home  Disposition: Home   Discharge Condition: fair  CODE STATUS:   Code Status: Full Code   Diet recommendation:  Diet Order             Diet general           Diet regular Room service appropriate? Yes; Fluid consistency: Thin  Diet effective now                    Brief Summary: Patient is a 26 y.o.  male with reported history of polysubstance abuse, bipolar disorder-who was brought to the ED with altered mental status-intubated in the ED for airway protection-and subsequently admitted by PCCM to the ICU.  Extubated-and subsequently transferred to [facility] on 6/23.     Significant events: 6/20>> brought to ED by EMS-combative/agitated-intubated-admitted by PCCM. 6/22>> extubated-but severely agitated/confused-requiring Geodon 6/23>> transfer to [facility]-remains confused/agitated.   Significant studies: 6/20>> CXR: No PNA 6/21>> UDS: Positive for cocaine/benzos. 6/21>> CT head: No acute abnormality 6/21>> CT C-spine: No acute abnormality 6/21>>Spot EEG: No seizures-severe diffuse encephalopathy. 6/22>> LTM EEG: No seizures.   Significant microbiology data: None   Procedures: None   Consults: PCCM Psychiatry  Brief Hospital Course: Acute toxic encephalopathy-history of polysubstance abuse (fentanyl/cocaine/benzos): Initially felt to be due to intoxication-over the past few days this was likely due to drug withdrawals.  He has improved with supportive care-much more awake and alert compared to the past few  days-but still with significant behavioral issues (spitting/throwing poop/mastrubating).  Evaluated by psychiatry-apparently has bipolar disorder and self medicates with illicit drugs.  Due to risk for self-harm--one-to-one sitter recommended-has been IVC'd after psych eval on 6/25. He was managed with Ativan per [facility]WA protocol/clonidine taper.  Since overall improved-upon psych re-eval today (d/w  Starkes-Perry FNP)-IVC was rescinded and patient was cleared for d/c, recommendations are for outpatient psych follow up   Bipolar disorder with manic episode/substance abuse disorder: See above. Acknowledges fentanyl use-UDS positive for cocaine/benzos-acknowledges using Xanax that he gets from the streets.  Also acknowledges doing multiple drugs-"what ever I can get my hands on".Recommendations are for outpatient drug rehab-although patient does not seem to have any intention of quitting at this point.   Positive HCV antibody:viral load pending-will need to be followed by his outpatient MD's.  Thankfully HIV/HBsAg negative.   Hypokalemia: Repleted  Acute hypoxic respiratory failure: In the setting of encephalopathy-intubated on admission-extubated on 6/22.  Stable on room air.   Laceration to right occipital scalp: Stapled in the emergency room-Tdap ordered on 6/25.   Will need staple removed 1 week from 6/21.   BMI: Estimated body mass index is 19.77 kg/m as calculated from the following:   Height as of this encounter: 5\' 8"  (1.727 m).   Weight as of this encounter: 59 kg.      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) Active Problems:   Overdose   Acute encephalopathy   Acute respiratory failure (HCC)   Bipolar I disorder, current or most recent episode manic, severe (  HCC)   Polysubstance dependence including opioid type drug with complication, continuous use [facility])   Discharge  Instructions:  Activity:  As tolerated   Discharge Instructions     Diet general   Complete by: As directed    Discharge instructions   Complete by: As directed    Please go to your Primary care practitioner or local urgent care or Local ED to have staples removed around 11/15/21  Your Hepatitis C antibody was positive, we have checked your viral load-please ask your primary care practitioner to follow up on these results. If your have a significant viral load, you may benefit from further treatment of hepatitis c infection  Follow with Primary MD  in 1-2 weeks  Stop doing illicit drugs  Follow with outpatient psychiatry services as instructed  Please get a complete blood count and chemistry panel checked by your Primary MD at your next visit, and again as instructed by your Primary MD.  Get Medicines reviewed and adjusted: Please take all your medications with you for your next visit with your Primary MD  Laboratory/radiological data: Please request your Primary MD to go over all hospital tests and procedure/radiological results at the follow up, please ask your Primary MD to get all Hospital records sent to his/her office.  In some cases, they will be blood work, cultures and biopsy results pending at the time of your discharge. Please request that your primary care M.D. follows up on these results.  Also Note the following: If you experience worsening of your admission symptoms, develop shortness of breath, life threatening emergency, suicidal or homicidal thoughts you must seek medical attention immediately by calling 911 or calling your MD immediately  if symptoms less severe.  You must read complete instructions/literature along with all the possible adverse reactions/side effects for all the Medicines you take and that have been prescribed to you. Take any new Medicines after you have completely understood and accpet all the possible adverse reactions/side effects.   Do not  drive when taking Pain medications or sleeping medications (Benzodaizepines)  Do not take more than prescribed Pain, Sleep and Anxiety Medications. It is not advisable to combine anxiety,sleep and pain medications without talking with your primary care practitioner  Special Instructions: If you have smoked or chewed Tobacco  in the last 2 yrs please stop smoking, stop any regular Alcohol  and or any Recreational drug use.  Wear Seat belts while driving.  Please note: You were cared for by a hospitalist during your hospital stay. Once you are discharged, your primary care physician will handle any further medical issues. Please note that NO REFILLS for any discharge medications will be authorized once you are discharged, as it is imperative that you return to your primary care physician (or establish a relationship with a primary care physician if you do not have one) for your post hospital discharge needs so that they can reassess your need for medications and monitor your lab values.   Increase activity slowly   Complete by: As directed    No wound care   Complete by: As directed       Allergies as of 11/13/2021       Reactions   Fish Allergy Hives   Catfish   Suboxone [buprenorphine Hcl-naloxone Hcl] Anaphylaxis        Medication List     TAKE these medications    acetaminophen 500 MG tablet Commonly known as: TYLENOL Take 1,000 mg by mouth every 6 (six) hours  as needed for moderate pain.   benzocaine 10 % mucosal gel Commonly known as: ORAJEL Use as directed 1 Application in the mouth or throat daily as needed for mouth pain.        Follow-up Information     Services, [facility]. Call today.   Why: Residential Services, For wound re-check Contact information: [address] [place] [address] [phone]         [facility] Follow up.   Why: Crisis center, Hospital follow up Contact information: [address] [place] [address] [phone]        [facility] EMERGENCY DEPARTMENT Follow up on 11/15/2021.   Specialty: Emergency Medicine Why: For suture removal Contact information: [address] [id number] mc [place] [address] [phone]               Allergies  Allergen Reactions   Fish Allergy Hives    Catfish   Suboxone [Buprenorphine Hcl-Naloxone Hcl] Anaphylaxis     Other Procedures/Studies: Overnight EEG with video  Result Date: 11/09/2021 Charlsie Quest, MD     11/10/2021  8:54 AM Patient Name: Vito Backers Doe MRN: [id number] Epilepsy Attending: Charlsie Quest Referring Physician/Provider: Cheri Fowler, MD Duration: 11/08/2021 1036 to 11/09/2021 1036  Patient history: Unidentified male presented with altered mental status with acute intoxication.  UDS positive for cocaine and benzos. EEG to evaluate for seizure.  Level of alertness: comatose  AEDs during EEG study: Propofol  Technical aspects: This EEG study was done with scalp electrodes positioned according to the 10-20 International system of electrode placement. Electrical activity was acquired at a sampling rate of 500Hz  and reviewed with a high frequency filter of 70Hz  and a low frequency filter of 1Hz . EEG data were recorded continuously and digitally stored.  Description: EEG showed continuous generalized 2 to 3 Hz delta slowing admixed with an excessive amount of 15 to 18 Hz beta activity  distributed symmetrically and diffusely. Hyperventilation and photic stimulation were not performed.    ABNORMALITY - Continuous slow, generalized - Excessive beta, generalized  IMPRESSION: This study is suggestive of severe diffuse encephalopathy, nonspecific etiology but likely related to sedation. The excessive beta activity seen in the background is most likely due to the effect of medications like benzodiazepine and is a benign EEG pattern. No seizures or epileptiform  discharges were seen throughout the recording.  Charlsie Quest    DG Chest Port 1 View  Result Date: 11/08/2021 CLINICAL DATA:  Recent aspiration and respiratory difficulty, initial encounter EXAM: PORTABLE CHEST 1 VIEW COMPARISON:  Film from earlier in the same day. FINDINGS: Endotracheal tube and gastric catheter are noted in satisfactory position. Cardiac shadow is within normal limits. The lungs are clear bilaterally stable from the prior exam. IMPRESSION: No active disease. Electronically Signed   By: Alcide Clever M.D.   On: 11/08/2021 20:00   EEG adult  Result Date: 11/08/2021 Charlsie Quest, MD     11/08/2021 11:11 AM Patient Name: Burman Blacksmith MRN: [id number] Epilepsy Attending: Charlsie Quest Referring Physician/Provider: Cheri Fowler, MD Date: 11/08/2021 Duration: 29.41 mins Patient history: Unidentified male presented with altered mental status with acute intoxication.  UDS positive for cocaine and benzos. EEG to evaluate for seizure. Level of alertness: comatose AEDs during EEG study: Propofol, Versed Technical aspects: This EEG study was done with scalp electrodes positioned according to the 10-20 International system of electrode placement. Electrical activity was acquired  at a sampling rate of 500Hz  and reviewed with a high frequency filter of 70Hz  and a low frequency filter of 1Hz . EEG data were recorded continuously and digitally stored. Description: EEG showed continuous generalized 2 to 3 Hz delta slowing admixed with an excessive amount of 15 to 18 Hz beta activity  distributed symmetrically and diffusely. Hyperventilation and photic stimulation were not performed.   ABNORMALITY - Continuous slow, generalized - Excessive beta, generalized IMPRESSION: This study is suggestive of severe diffuse encephalopathy, nonspecific etiology but likely related to sedation. The excessive beta activity seen in the background is most likely due to the effect of benzodiazepine and is a benign EEG  pattern. No seizures or epileptiform discharges were seen throughout the recording. Charlsie Quest   CT Head Wo Contrast  Result Date: 11/08/2021 CLINICAL DATA:  Found walking down Street with posterior scalp bleeding, initial encounter EXAM: CT HEAD WITHOUT CONTRAST CT CERVICAL SPINE WITHOUT CONTRAST TECHNIQUE: Multidetector CT imaging of the head and cervical spine was performed following the standard protocol without intravenous contrast. Multiplanar CT image reconstructions of the cervical spine were also generated. RADIATION DOSE REDUCTION: This exam was performed according to the departmental dose-optimization program which includes automated exposure control, adjustment of the mA and/or kV according to patient size and/or use of iterative reconstruction technique. COMPARISON:  None Available. FINDINGS: CT HEAD FINDINGS Brain: No evidence of acute infarction, hemorrhage, hydrocephalus, extra-axial collection or mass lesion/mass effect. Vascular: No hyperdense vessel or unexpected calcification. Skull: Normal. Negative for fracture or focal lesion. Sinuses/Orbits: No acute finding. Other: None. CT CERVICAL SPINE FINDINGS Alignment: Within normal limits. Skull base and vertebrae: 7 cervical segments are well visualized. Vertebral body height is well maintained. No acute fracture or acute facet abnormality is noted. Soft tissues and spinal canal: Surrounding soft tissue structures are within normal limits. Upper chest: Lung apices are within normal limits. Other: Endotracheal tube and gastric catheter are noted. IMPRESSION: CT of the head: No acute intracranial abnormality noted. CT of the cervical spine: No acute abnormality noted. Electronically Signed   By: Alcide Clever M.D.   On: 11/08/2021 01:57   CT CERVICAL SPINE WO CONTRAST  Result Date: 11/08/2021 CLINICAL DATA:  Found walking down Street with posterior scalp bleeding, initial encounter EXAM: CT HEAD WITHOUT CONTRAST CT CERVICAL SPINE WITHOUT  CONTRAST TECHNIQUE: Multidetector CT imaging of the head and cervical spine was performed following the standard protocol without intravenous contrast. Multiplanar CT image reconstructions of the cervical spine were also generated. RADIATION DOSE REDUCTION: This exam was performed according to the departmental dose-optimization program which includes automated exposure control, adjustment of the mA and/or kV according to patient size and/or use of iterative reconstruction technique. COMPARISON:  None Available. FINDINGS: CT HEAD FINDINGS Brain: No evidence of acute infarction, hemorrhage, hydrocephalus, extra-axial collection or mass lesion/mass effect. Vascular: No hyperdense vessel or unexpected calcification. Skull: Normal. Negative for fracture or focal lesion. Sinuses/Orbits: No acute finding. Other: None. CT CERVICAL SPINE FINDINGS Alignment: Within normal limits. Skull base and vertebrae: 7 cervical segments are well visualized. Vertebral body height is well maintained. No acute fracture or acute facet abnormality is noted. Soft tissues and spinal canal: Surrounding soft tissue structures are within normal limits. Upper chest: Lung apices are within normal limits. Other: Endotracheal tube and gastric catheter are noted. IMPRESSION: CT of the head: No acute intracranial abnormality noted. CT of the cervical spine: No acute abnormality noted. Electronically Signed   By: Alcide Clever M.D.   On: 11/08/2021  01:57   DG Chest Portable 1 View  Result Date: 11/08/2021 CLINICAL DATA:  Check endotracheal tube placement EXAM: PORTABLE CHEST 1 VIEW COMPARISON:  Film from the previous day FINDINGS: Cardiac shadow is stable. Endotracheal tube is noted in satisfactory position. Gastric catheter extends into the stomach. Lungs remain clear. No bony abnormality is noted. IMPRESSION: Tubes and lines in satisfactory position. No other focal abnormality is noted. Electronically Signed   By: Alcide Clever M.D.   On: 11/08/2021  01:22   DG Chest Port 1 View  Result Date: 11/07/2021 CLINICAL DATA:  Altered mental status. EXAM: PORTABLE CHEST 1 VIEW COMPARISON:  None Available. FINDINGS: No focal consolidation, pleural effusion, pneumothorax. Top-normal cardiac size. No acute osseous pathology. IMPRESSION: No active disease. Electronically Signed   By: Elgie Collard M.D.   On: 11/07/2021 23:57     TODAY-DAY OF DISCHARGE:  Subjective:   Joseph Nunez today has no headache,no chest abdominal pain,no new weakness tingling or numbness, feels much better wants to go home today.   Objective:   Blood pressure 102/62, pulse 72, temperature 98.9 F (37.2 C), temperature source Axillary, resp. rate 17, height 5\' 8"  (1.727 m), weight 59 kg, SpO2 98 %.  Intake/Output Summary (Last 24 hours) at 11/13/2021 1443 Last data filed at 11/13/2021 1200 Gross per 24 hour  Intake 2157 ml  Output 3 ml  Net 2154 ml   Filed Weights   11/07/21 2206  Weight: 59 kg    Exam: Awake Alert, Oriented *3, No new F.N deficits, Normal affect Brent.AT,PERRAL Supple Neck,No JVD, No cervical lymphadenopathy appriciated.  Symmetrical Chest wall movement, Good air movement bilaterally, CTAB RRR,No Gallops,Rubs or new Murmurs, No Parasternal Heave +ve B.Sounds, Abd Soft, Non tender, No organomegaly appriciated, No rebound -guarding or rigidity. No Cyanosis, Clubbing or edema, No new Rash or bruise   PERTINENT RADIOLOGIC STUDIES: No results found.   PERTINENT LAB RESULTS: CBC: No results for input(s): "WBC", "HGB", "HCT", "PLT" in the last 72 hours. CMET CMP     Component Value Date/Time   NA 137 11/12/2021 0815   K 4.3 11/12/2021 0815   CL 103 11/12/2021 0815   CO2 25 11/12/2021 0815   GLUCOSE 98 11/12/2021 0815   BUN 7 11/12/2021 0815   CREATININE 0.82 11/12/2021 0815   CALCIUM 9.3 11/12/2021 0815   PROT 6.6 11/11/2021 0747   ALBUMIN 3.6 11/11/2021 0747   AST 22 11/11/2021 0747   ALT 39 11/11/2021 0747   ALKPHOS 47  11/11/2021 0747   BILITOT 0.4 11/11/2021 0747   GFRNONAA >60 11/12/2021 0815    GFR Estimated Creatinine Clearance: 114.9 mL/min (by C-G formula based on SCr of 0.82 mg/dL). No results for input(s): "LIPASE", "AMYLASE" in the last 72 hours. No results for input(s): "CKTOTAL", "CKMB", "CKMBINDEX", "TROPONINI" in the last 72 hours. Invalid input(s): "POCBNP" No results for input(s): "DDIMER" in the last 72 hours. No results for input(s): "HGBA1C" in the last 72 hours. No results for input(s): "CHOL", "HDL", "LDLCALC", "TRIG", "CHOLHDL", "LDLDIRECT" in the last 72 hours. No results for input(s): "TSH", "T4TOTAL", "T3FREE", "THYROIDAB" in the last 72 hours.  Invalid input(s): "FREET3" No results for input(s): "VITAMINB12", "FOLATE", "FERRITIN", "TIBC", "IRON", "RETICCTPCT" in the last 72 hours. Coags: No results for input(s): "INR" in the last 72 hours.  Invalid input(s): "PT" Microbiology: Recent Results (from the past 240 hour(s))  MRSA Next Gen by PCR, Nasal     Status: Abnormal   Collection Time: 11/12/21 12:09 PM   Specimen:  Nasal Mucosa; Nasal Swab  Result Value Ref Range Status   MRSA by PCR Next Gen DETECTED (A) NOT DETECTED Final    Comment: RESULT CALLED TO, READ BACK BY AND VERIFIED WITH: ADC CALLED 11/12/2021 @ 1511, TO RN ROY PREWETT (NOTE) The GeneXpert MRSA Assay (FDA approved for NASAL specimens only), is one component of a comprehensive MRSA colonization surveillance program. It is not intended to diagnose MRSA infection nor to guide or monitor treatment for MRSA infections. Test performance is not FDA approved in patients less than 25 years old. Performed at [facility], [address] [place], [place] [address]     FURTHER DISCHARGE INSTRUCTIONS:  Get Medicines reviewed and adjusted: Please take all your medications with you for your next visit with your Primary MD  Laboratory/radiological data: Please request your Primary MD to go over all  hospital tests and procedure/radiological results at the follow up, please ask your Primary MD to get all Hospital records sent to his/her office.  In some cases, they will be blood work, cultures and biopsy results pending at the time of your discharge. Please request that your primary care M.D. goes through all the records of your hospital data and follows up on these results.  Also Note the following: If you experience worsening of your admission symptoms, develop shortness of breath, life threatening emergency, suicidal or homicidal thoughts you must seek medical attention immediately by calling 911 or calling your MD immediately  if symptoms less severe.  You must read complete instructions/literature along with all the possible adverse reactions/side effects for all the Medicines you take and that have been prescribed to you. Take any new Medicines after you have completely understood and accpet all the possible adverse reactions/side effects.   Do not drive when taking Pain medications or sleeping medications (Benzodaizepines)  Do not take more than prescribed Pain, Sleep and Anxiety Medications. It is not advisable to combine anxiety,sleep and pain medications without talking with your primary care practitioner  Special Instructions: If you have smoked or chewed Tobacco  in the last 2 yrs please stop smoking, stop any regular Alcohol  and or any Recreational drug use.  Wear Seat belts while driving.  Please note: You were cared for by a hospitalist during your hospital stay. Once you are discharged, your primary care physician will handle any further medical issues. Please note that NO REFILLS for any discharge medications will be authorized once you are discharged, as it is imperative that you return to your primary care physician (or establish a relationship with a primary care physician if you do not have one) for your post hospital discharge needs so that they can reassess your need for  medications and monitor your lab values.  Total Time spent coordinating discharge including counseling, education and face to face time equals greater than 30 minutes.  SignedJeoffrey Massed 11/13/2021 2:43 PM

## 2021-11-13 NOTE — Progress Notes (Addendum)
Per psychiatry, IVC to be rescinded. CSW faxed signed Commitment change to magistrate. Will provide bus pass.  Joaquin Courts, MSW, [facility]

## 2021-11-13 NOTE — Consult Note (Addendum)
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 male patient admitted with aggressive behavior and altered mental status.  HPI:  At bedside, patient is alert and well-appearing. He presents with pleasant affect and is linear throughout conversation. States that she has no withdrawal symptoms this morning and has no physical complaints. He specifically denies diaphoresis, tremors, visual hallucinations, anxiety. States that he was feeling like he was in withdrawal this morning and really wanted to smoke a cigarette. Denies ever requiring ICU care for withdrawal. Reports using the following substances since discharge: Methamphetamine, heroin, crack cocaine,"whatever I can shoot up in IV" . States that he has been using substances since the age of 68. Longest sobriety period was 17 months. He is frustrated and feels like a failure due to his relapse and inability to stay away from substances.  In regards to his mood, he reports she has been depressed since the age of 39. States that he has had intermittent SI throughout his life. Denies any specific plan or any thoughts at  this time.  Lastly, he denies auditory and visual hallucinations, when sober. He does admit that he is unable to recall his behaviors when he is under the influence.  I explored effects of substances on his mental health with him. Patient is in denial about the negative effects. Says it helps him cope. No motivation to stay sober. Risks especially on effects of substances with respect to suicide was discussed with him. Effects of substances seems to have worn off. His mood has dramatically improved. Patient is no longer expressing any suicidal thoughts. He is engaging more. Patient has limited insight on the role of substances in his mental health. He does not seem motivated to engage in any relapse preventive measure at this time.  WIll psych clear at this time.    Spoke with mom Aura Fey)- I spoke with patient's mother to obtain collateral information and discuss his treatment plan.  Mother corroborates the history that led to patient's hospitalization.  She reports that patient has been depressed in the context of trauma and his girlfriend.  She corroborates the history of depressive episodes as reported by patient and mentioned above.  She also reports that patient has been very anxious in general and especially in social settings.  I discussed with her the diagnostic impression and recommended inpatient residential and complete cessation of substances. She is encouraged to help him remain free of substances and try to refrain from enabling him.    Past Psychiatric History: as above  Risk to Self:  yes, attempting to leave hospital even though he is in withdrawal Risk to Others:  denies Prior Inpatient Therapy:  multiple in [place] per patient's report Prior Outpatient Therapy:  non-compliant  Past Medical History:  No past medical history on file.  The histories are not reviewed yet. Please review them in the "History" navigator section and refresh this SmartLink. Family History:  Family  History  Problem Relation Age of Onset   Drug abuse Mother     Family Psychiatric  History: pt refused to answer Social History:  Social History   Substance and Sexual Activity  Alcohol Use Yes     Social History   Substance and Sexual Activity  Drug Use Yes   Types: Amphetamines, Fentanyl, IV, Marijuana, MDMA (Ecstacy), Methamphetamines, Cocaine, Benzodiazepines    Social History   Socioeconomic History   Marital status: Single    Spouse name: Not on file   Number of children: Not on file   Years of education: Not on file   Highest education level: High school graduate  Occupational History   Not on file  Tobacco Use   Smoking status: Every Day    Packs/day: 0.25    Types: Cigarettes   Smokeless tobacco: Not on file  Substance and Sexual Activity   Alcohol use: Yes   Drug use: Yes    Types: Amphetamines, Fentanyl, IV, Marijuana, MDMA (Ecstacy), Methamphetamines, Cocaine, Benzodiazepines   Sexual activity: Not Currently  Other Topics Concern   Not on file  Social History Narrative   Not on file   Social Determinants of Health   Financial Resource Strain: Not on file  Food Insecurity: Food Insecurity Present (11/12/2021)   Hunger Vital Sign    Worried About Running Out of Food in the Last Year: Often true    Ran Out of Food in the Last Year: Often true  Transportation Needs: Not on file  Physical Activity: Not on file  Stress: Not on file  Social Connections: Not on file   Additional Social History:    Allergies:   Allergies  Allergen Reactions   Fish Allergy Hives    Catfish   Suboxone [Buprenorphine Hcl-Naloxone Hcl] Anaphylaxis    Labs:  Results for orders placed or performed during the hospital encounter of 11/07/21 (from the past 48 hour(s))  Basic metabolic panel     Status: None   Collection Time: 11/12/21  8:15 AM  Result Value Ref Range   Sodium 137 135 - 145 mmol/L   Potassium 4.3 3.5 - 5.1 mmol/L    Comment: DELTA CHECK NOTED    Chloride 103 98 - 111 mmol/L   CO2 25 22 - 32 mmol/L   Glucose, Bld 98 70 - 99 mg/dL    Comment: Glucose reference range applies only to samples taken after fasting for at least 8 hours.   BUN 7 6 - 20 mg/dL   Creatinine, Ser 1.61 0.61 - 1.24 mg/dL   Calcium 9.3 8.9 - 09.6 mg/dL   GFR, Estimated >04 >54 mL/min    Comment: (NOTE) Calculated using the CKD-EPI Creatinine Equation (2021)    Anion gap 9 5 - 15    Comment: Performed at [facility], [address] [place], [place] [address]  HIV Antibody (routine testing w rflx)     Status: None   Collection Time: 11/12/21  8:15 AM  Result Value Ref Range   HIV Screen 4th Generation wRfx Non Reactive Non Reactive    Comment: Performed at [facility], [address] [place], [place] [address]  Hepatitis C antibody     Status: Abnormal   Collection Time: 11/12/21  8:15 AM  Result Value Ref Range   HCV  Ab Reactive (A) NON REACTIVE    Comment: (NOTE) The CDC recommends that a Reactive HCV antibody result be followed up  with a HCV Nucleic Acid Amplification test.  Performed at [facility], [address] [place], [place] [address]   Hepatitis B surface antigen     Status: None   Collection Time: 11/12/21  8:15 AM  Result Value Ref Range   Hepatitis B Surface Ag NON REACTIVE NON REACTIVE    Comment: Performed at [facility], [address] [place], [place] [address]  MRSA Next Gen by PCR, Nasal     Status: Abnormal   Collection Time: 11/12/21 12:09 PM   Specimen: Nasal Mucosa; Nasal Swab  Result Value Ref Range   MRSA by PCR Next Gen DETECTED (A) NOT DETECTED    Comment: RESULT CALLED TO, READ BACK BY AND VERIFIED WITH: ADC CALLED 11/12/2021 @ 1511, TO RN ROY PREWETT (NOTE) The GeneXpert MRSA Assay (FDA approved for NASAL specimens only), is one component of a comprehensive MRSA colonization surveillance program. It is not intended to diagnose MRSA infection nor to guide or monitor treatment for MRSA  infections. Test performance is not FDA approved in patients less than 56 years old. Performed at [facility], [address] [place], [place] [address]     Current Facility-Administered Medications  Medication Dose Route Frequency Provider Last Rate Last Admin   ALPRAZolam Prudy Feeler) tablet 0.5 mg  0.5 mg Oral TID PRN Maretta Bees, MD   0.5 mg at 11/13/21 1347   Chlorhexidine Gluconate Cloth 2 % PADS 6 each  6 each Topical Daily Cheri Fowler, MD   6 each at 11/12/21 1500   cloNIDine (CATAPRES) tablet 0.1 mg  0.1 mg Oral BH-qamhs Ghimire, Shanker M, MD   0.1 mg at 11/13/21 0757   Followed by   Melene Muller ON 11/14/2021] cloNIDine (CATAPRES) tablet 0.1 mg  0.1 mg Oral QAC breakfast Ghimire, Werner Lean, MD       dicyclomine (BENTYL) tablet 20 mg  20 mg Oral Q6H PRN Ghimire, Werner Lean, MD       docusate sodium (COLACE) capsule 100 mg  100 mg Oral BID Cheri Fowler, MD   100 mg at 11/12/21 2133   enoxaparin (LOVENOX) injection 40 mg  40 mg Subcutaneous Q24H Gleason, Darcella Gasman, PA-C   40 mg at 11/12/21 1007   feeding supplement (ENSURE ENLIVE / ENSURE PLUS) liquid 237 mL  237 mL Oral BID BM Maretta Bees, MD   237 mL at 11/13/21 0913   folic acid (FOLVITE) tablet 1 mg  1 mg Oral Daily Maretta Bees, MD   1 mg at 11/13/21 0756   gabapentin (NEURONTIN) tablet 300 mg  300 mg Oral TID Thedore Mins, MD   300 mg at 11/13/21 0755   haloperidol (HALDOL) tablet 5 mg  5 mg Oral Q6H PRN Maretta Bees, MD   5 mg at 11/13/21 9147   Or   haloperidol lactate (HALDOL) injection 5 mg  5 mg Intramuscular Q6H PRN Maretta Bees, MD       hydrOXYzine (ATARAX) tablet 25 mg  25 mg Oral Q6H PRN Maretta Bees, MD   25 mg at 11/13/21 0527   loperamide (IMODIUM) capsule 2-4 mg  2-4 mg Oral PRN Maretta Bees, MD   4 mg at 11/10/21 2039   LORazepam (ATIVAN) tablet 0-4 mg  0-4 mg Oral Q8H Ghimire, Werner Lean, MD   2  mg at 11/13/21 1347   methocarbamol (ROBAXIN) tablet 500 mg  500 mg Oral  Q8H PRN Maretta Bees, MD   500 mg at 11/12/21 1430   multivitamin with minerals tablet 1 tablet  1 tablet Oral Daily Maretta Bees, MD   1 tablet at 11/13/21 0756   naproxen (NAPROSYN) tablet 500 mg  500 mg Oral BID PRN Maretta Bees, MD       nicotine (NICODERM CQ - dosed in mg/24 hours) patch 28 mg  28 mg Transdermal Daily Maretta Bees, MD   28 mg at 11/13/21 0910   ondansetron (ZOFRAN) injection 4 mg  4 mg Intravenous Q6H PRN Gleason, Darcella Gasman, PA-C       Oral care mouth rinse  15 mL Mouth Rinse PRN Cheri Fowler, MD       pantoprazole (PROTONIX) EC tablet 40 mg  40 mg Oral Daily Cheri Fowler, MD   40 mg at 11/13/21 0757   pneumococcal 20-valent conjugate vaccine (PREVNAR 20) injection 0.5 mL  0.5 mL Intramuscular Tomorrow-1000 Ghimire, Werner Lean, MD       polyethylene glycol (MIRALAX / GLYCOLAX) packet 17 g  17 g Oral Daily PRN Cheri Fowler, MD       thiamine tablet 100 mg  100 mg Oral Daily Maretta Bees, MD   100 mg at 11/13/21 0900   Or   thiamine (B-1) injection 100 mg  100 mg Intravenous Daily Ghimire, Werner Lean, MD        Musculoskeletal: Strength & Muscle Tone: within normal limits Gait & Station: unsteady Patient leans: Front    Psychiatric Specialty Exam:  Presentation  General Appearance: Appropriate for Environment  Eye Contact:Fleeting  Speech:Clear and Coherent  Speech Volume:Normal  Handedness:Right   Mood and Affect  Mood:Anxious; Irritable  Affect:Congruent   Thought Process  Thought Processes:Linear  Descriptions of Associations:Intact  Orientation:Full (Time, Place and Person)  Thought Content:Logical  History of Schizophrenia/Schizoaffective disorder:No data recorded Duration of Psychotic Symptoms:No data recorded Hallucinations:Hallucinations: None  Ideas of Reference:None  Suicidal Thoughts:Suicidal Thoughts: No  Homicidal Thoughts:Homicidal Thoughts: No   Sensorium  Memory:Immediate Fair; Recent Fair;  Remote Fair  Judgment:Poor  Insight:Poor   Executive Functions  Concentration:Poor  Attention Span:Fair  Recall:Fair  Fund of Knowledge:Fair  Language:Fair   Psychomotor Activity  Psychomotor Activity:Psychomotor Activity: Normal   Assets  Assets:Social Support   Sleep  Sleep:Sleep: Poor   Physical Exam: Physical Exam Vitals and nursing note reviewed.  Constitutional:      General: He is sleeping.     Appearance: He is normal weight.  Neurological:     General: No focal deficit present.     Mental Status: He is oriented to person, place, and time and easily aroused. Mental status is at baseline.  Psychiatric:        Attention and Perception: Attention and perception normal.        Mood and Affect: Mood and affect normal.        Speech: Speech is delayed.        Behavior: Behavior normal. Behavior is cooperative.        Thought Content: Thought content normal.        Cognition and Memory: Cognition and memory normal.        Judgment: Judgment is impulsive.    Review of Systems  Psychiatric/Behavioral:  Positive for substance abuse. Negative for hallucinations, memory loss and suicidal ideas. The patient is nervous/anxious. The patient does not have insomnia.  All other systems reviewed and are negative.  Blood pressure 102/62, pulse 72, temperature 98.9 F (37.2 C), temperature source Axillary, resp. rate 17, height 5\' 8"  (1.727 m), weight 59 kg, SpO2 98 %. Body mass index is 19.77 kg/m.   Plan/Recommendations: -DC 1:1 sitter for safety -Continue Lorazepam, CIWA withdrawal protocol -Continue Haldol 5 mg PO/IM every 8 hr as needed for agitation -Pls monitor EKG  for Qtc prolongation-patient on psychotropics -Continue Gabapentin 300 mg TID for agitation/anxiety/cocaine induced mood disorder Rescind IVC at this time.   Disposition: No evidence of imminent risk to self or others at present.   Patient does not meet criteria for psychiatric inpatient  admission. Supportive therapy provided about ongoing stressors. Refer to IOP. Discussed crisis plan, support from social network, calling 911, coming to the Emergency Department, and calling Suicide Hotline.  Maryagnes Amos, FNP 11/13/2021 2:30 PM

## 2021-11-13 NOTE — Progress Notes (Signed)
Hospital security called @0838  for pt trying to push through nursing staff to leave unit.  Security in room.  PRN medications given and pt now resting.

## 2021-11-13 NOTE — Progress Notes (Addendum)
PROGRESS NOTE        PATIENT DETAILS Name: Joseph Nunez Age: 26 y.o. Sex: male Date of Birth: [birth year] Admit Date: 11/07/2021 Admitting Physician Oretha Milch, MD PCP:No primary care provider on file.  Brief Summary: Patient is a 26 y.o.  male with reported history of polysubstance abuse, bipolar disorder-who was brought to the ED with altered mental status-intubated in the ED for airway protection-and subsequently admitted by PCCM to the ICU.  Extubated-and subsequently transferred to [facility] on 6/23.    Significant events: 6/20>> brought to ED by EMS-combative/agitated-intubated-admitted by PCCM. 6/22>> extubated-but severely agitated/confused-requiring Geodon 6/23>> transfer to [facility]-remains confused/agitated.  Significant studies: 6/20>> CXR: No PNA 6/21>> UDS: Positive for cocaine/benzos. 6/21>> CT head: No acute abnormality 6/21>> CT C-spine: No acute abnormality 6/21>>Spot EEG: No seizures-severe diffuse encephalopathy. 6/22>> LTM EEG: No seizures.  Significant microbiology data: None  Procedures: None  Consults: PCCM Psychiatry  Subjective: Per nursing staff-still with significant behavioral issues-attempting to leave the unit, mastrubating multiple times in front of male staff.  Last week-he apparently was spitting and throwing poop as well.  Asking to be let out so he could smoke.  He is much more awake/alert compared to the past few days-but continues to have behavioral outbursts multiple times a day.  Objective: Vitals: Blood pressure 102/62, pulse 70, temperature 98.9 F (37.2 C), temperature source Axillary, resp. rate 17, height 5\' 8"  (1.727 m), weight 59 kg, SpO2 98 %.   Exam: Gen Exam:not in any distress HEENT:atraumatic, normocephalic Chest: B/L clear to auscultation anteriorly CVS:S1S2 regular Abdomen:soft non tender, non distended Extremities:no edema Neurology: Non focal Skin: no rash   Pertinent  Labs/Radiology:    Latest Ref Rng & Units 11/09/2021    7:10 AM 11/08/2021    9:07 AM 11/08/2021    5:19 AM  CBC  WBC 4.0 - 10.5 K/uL 13.3  7.9    Hemoglobin 13.0 - 17.0 g/dL 78.2  95.6  21.3   Hematocrit 39.0 - 52.0 % 43.4  38.4  37.0   Platelets 150 - 400 K/uL 283  179      Lab Results  Component Value Date   NA 137 11/12/2021   K 4.3 11/12/2021   CL 103 11/12/2021   CO2 25 11/12/2021      Assessment/Plan: Acute toxic encephalopathy-history of polysubstance abuse (fentanyl/cocaine/benzos): Initially felt to be due to intoxication-over the past few days this was likely due to drug withdrawals.  He has improved with supportive care-much more awake and alert compared to the past few days-but still with significant behavioral issues (spitting/throwing poop/mastrubating).  Evaluated by psychiatry-apparently has bipolar disorder and self medicates with illicit drugs.  Due to risk for self-harm--one-to-one sitter recommended-has been IVC'd after psych eval on 6/25.  Continue to taper Ativan per CIWA protocol/clonidine taper.  Await further recommendations from psychiatry.  Bipolar disorder with manic episode/substance abuse disorder: See above-await psych eval.  Acknowledges fentanyl use-UDS positive for cocaine/benzos-acknowledges using Xanax that he gets from the streets.  Also acknowledges doing multiple drugs-"what ever I can get my hands on".  Positive HCV antibody: Check viral load if he allows-if not-this can be done in the outpatient setting.  Thankfully HIV/HBsAg negative.  Hypokalemia: Repleted-we will recheck periodically-has been refusing/noncooperative with phlebotomy staff.  Acute hypoxic respiratory failure: In the setting of encephalopathy-intubated on admission-extubated on 6/22.  Stable on room  air.  Laceration to right occipital scalp: Stapled in the emergency room-Tdap ordered on 6/25.   Will need staple removed 1 week from 6/21.  BMI: Estimated body mass index is 19.77  kg/m as calculated from the following:   Height as of this encounter: 5\' 8"  (1.727 m).   Weight as of this encounter: 59 kg.   Code status:   Code Status: Full Code   DVT Prophylaxis: enoxaparin (LOVENOX) injection 40 mg Start: 11/08/21 1000 SCDs Start: 11/08/21 0302   Family Communication: Mother-Christine-[phone]-updated over the phone on 6/23  Disposition Plan: Status is: Inpatient Remains inpatient appropriate because: Resolving encephalopathy-sitter in place-under involuntary commitment-awaiting psych eval for disposition.   Planned Discharge Destination: Possibly behavioral health-depending on psych reevaluation.   Diet: Diet Order             Diet regular Room service appropriate? Yes; Fluid consistency: Thin  Diet effective now                     Antimicrobial agents: Anti-infectives (From admission, onward)    None        MEDICATIONS: Scheduled Meds:  Chlorhexidine Gluconate Cloth  6 each Topical Daily   cloNIDine  0.1 mg Oral BH-qamhs   Followed by   Melene Muller ON 11/14/2021] cloNIDine  0.1 mg Oral QAC breakfast   docusate sodium  100 mg Oral BID   enoxaparin (LOVENOX) injection  40 mg Subcutaneous Q24H   feeding supplement  237 mL Oral BID BM   folic acid  1 mg Oral Daily   gabapentin  300 mg Oral TID   LORazepam  0-4 mg Oral Q8H   multivitamin with minerals  1 tablet Oral Daily   nicotine  28 mg Transdermal Daily   pantoprazole  40 mg Oral Daily   pneumococcal 20-valent conjugate vaccine  0.5 mL Intramuscular Tomorrow-1000   thiamine  100 mg Oral Daily   Or   thiamine  100 mg Intravenous Daily   Continuous Infusions:   PRN Meds:.ALPRAZolam, dicyclomine, haloperidol **OR** haloperidol lactate, hydrOXYzine, loperamide, methocarbamol, naproxen, ondansetron (ZOFRAN) IV, mouth rinse, polyethylene glycol   I have personally reviewed following labs and imaging studies  LABORATORY DATA: CBC: Recent Labs  Lab 11/07/21 2306 11/08/21 0202  11/08/21 0519 11/08/21 0907 11/09/21 0710  WBC 12.5*  --   --  7.9 13.3*  NEUTROABS 9.3*  --   --   --  11.9*  HGB 13.7 12.6* 12.6* 13.4 15.1  HCT 39.9 37.0* 37.0* 38.4* 43.4  MCV 89.9  --   --  87.5 88.0  PLT 257  --   --  179 283     Basic Metabolic Panel: Recent Labs  Lab 11/07/21 2306 11/08/21 0202 11/08/21 0519 11/08/21 0907 11/09/21 0710 11/11/21 0747 11/12/21 0815  NA 137   < > 140 139 135 137 137  K 3.6   < > 3.2* 3.6 3.5 3.4* 4.3  CL 104  --   --  110 102 105 103  CO2 24  --   --  21* 22 20* 25  GLUCOSE 96  --   --  90 108* 187* 98  BUN 19  --   --  12 10 9 7   CREATININE 0.74  --   --  0.56* 0.63 0.79 0.82  CALCIUM 8.4*  --   --  9.1 9.1 8.8* 9.3  MG 2.1  --   --  2.1  --  1.8  --   PHOS  --   --   --  3.5  --  2.6  --    < > = values in this interval not displayed.     GFR: Estimated Creatinine Clearance: 114.9 mL/min (by C-G formula based on SCr of 0.82 mg/dL).  Liver Function Tests: Recent Labs  Lab 11/07/21 2306 11/09/21 0710 11/11/21 0747  AST 47* 25 22  ALT 55* 55* 39  ALKPHOS 57 52 47  BILITOT 0.6 1.4* 0.4  PROT 6.2* 6.9 6.6  ALBUMIN 3.6 3.7 3.6    No results for input(s): "LIPASE", "AMYLASE" in the last 168 hours. No results for input(s): "AMMONIA" in the last 168 hours.  Coagulation Profile: Recent Labs  Lab 11/07/21 2306  INR 1.0     Cardiac Enzymes: No results for input(s): "CKTOTAL", "CKMB", "CKMBINDEX", "TROPONINI" in the last 168 hours.  BNP (last 3 results) No results for input(s): "PROBNP" in the last 8760 hours.  Lipid Profile: No results for input(s): "CHOL", "HDL", "LDLCALC", "TRIG", "CHOLHDL", "LDLDIRECT" in the last 72 hours.   Thyroid Function Tests: No results for input(s): "TSH", "T4TOTAL", "FREET4", "T3FREE", "THYROIDAB" in the last 72 hours.  Anemia Panel: No results for input(s): "VITAMINB12", "FOLATE", "FERRITIN", "TIBC", "IRON", "RETICCTPCT" in the last 72 hours.  Urine analysis:    Component  Value Date/Time   COLORURINE YELLOW 11/08/2021 0117   APPEARANCEUR HAZY (A) 11/08/2021 0117   LABSPEC 1.025 11/08/2021 0117   PHURINE 6.0 11/08/2021 0117   GLUCOSEU NEGATIVE 11/08/2021 0117   HGBUR NEGATIVE 11/08/2021 0117   BILIRUBINUR NEGATIVE 11/08/2021 0117   KETONESUR NEGATIVE 11/08/2021 0117   PROTEINUR NEGATIVE 11/08/2021 0117   NITRITE NEGATIVE 11/08/2021 0117   LEUKOCYTESUR NEGATIVE 11/08/2021 0117    Sepsis Labs: Lactic Acid, Venous    Component Value Date/Time   LATICACIDVEN 0.7 11/08/2021 0907    MICROBIOLOGY: Recent Results (from the past 240 hour(s))  MRSA Next Gen by PCR, Nasal     Status: Abnormal   Collection Time: 11/12/21 12:09 PM   Specimen: Nasal Mucosa; Nasal Swab  Result Value Ref Range Status   MRSA by PCR Next Gen DETECTED (A) NOT DETECTED Final    Comment: RESULT CALLED TO, READ BACK BY AND VERIFIED WITH: ADC CALLED 11/12/2021 @ 1511, TO RN ROY PREWETT (NOTE) The GeneXpert MRSA Assay (FDA approved for NASAL specimens only), is one component of a comprehensive MRSA colonization surveillance program. It is not intended to diagnose MRSA infection nor to guide or monitor treatment for MRSA infections. Test performance is not FDA approved in patients less than 33 years old. Performed at [facility], [address] [place], [place] [address]     RADIOLOGY STUDIES/RESULTS: No results found.   LOS: 5 days   Jeoffrey Massed, MD  [facility]    To contact the attending provider between 7A-7P or the covering provider during after hours 7P-7A, please log into the web site [email or web address] and access using universal [place] password for that web site. If you do not have the password, please call the hospital operator.  11/13/2021, 9:14 AM

## 2021-11-14 ENCOUNTER — Encounter (HOSPITAL_COMMUNITY): Payer: Self-pay | Admitting: Emergency Medicine

## 2021-11-14 LAB — HCV RNA QUANT
HCV Quantitative Log: 5.196 log10 IU/mL (ref >1.70)
HCV Quantitative: 157000 IU/mL (ref >50)

## 2022-01-11 ENCOUNTER — Emergency Department (HOSPITAL_COMMUNITY)
Admission: EM | Admit: 2022-01-11 | Discharge: 2022-01-12 | Disposition: A | Discharge status: Home / Self Care | Payer: Self-pay | Attending: Emergency Medicine | Admitting: Emergency Medicine

## 2022-01-11 ENCOUNTER — Other Ambulatory Visit: Payer: Self-pay

## 2022-01-11 ENCOUNTER — Encounter (HOSPITAL_COMMUNITY): Payer: Self-pay | Admitting: Emergency Medicine

## 2022-01-11 DIAGNOSIS — M25512 Pain in left shoulder: Secondary | ICD-10-CM | POA: Insufficient documentation

## 2022-01-11 DIAGNOSIS — Z5321 Procedure and treatment not carried out due to patient leaving prior to being seen by health care provider: Secondary | ICD-10-CM | POA: Insufficient documentation

## 2022-01-11 LAB — CBC WITH DIFFERENTIAL/PLATELET
Abs Immature Granulocytes: 0.01 10*3/uL (ref 0.00–0.07)
Basophils Absolute: 0 10*3/uL (ref 0.0–0.1)
Basophils Relative: 0 %
Eosinophils Absolute: 0.1 10*3/uL (ref 0.0–0.5)
Eosinophils Relative: 2 %
HCT: 37.9 % — ABNORMAL LOW (ref 39.0–52.0)
Hemoglobin: 12.9 g/dL — ABNORMAL LOW (ref 13.0–17.0)
Immature Granulocytes: 0 %
Lymphocytes Relative: 27 %
Lymphs Abs: 2.1 10*3/uL (ref 0.7–4.0)
MCH: 30.7 pg (ref 26.0–34.0)
MCHC: 34 g/dL (ref 30.0–36.0)
MCV: 90.2 fL (ref 80.0–100.0)
Monocytes Absolute: 0.6 10*3/uL (ref 0.1–1.0)
Monocytes Relative: 8 %
Neutro Abs: 4.9 10*3/uL (ref 1.7–7.7)
Neutrophils Relative %: 63 %
Platelets: 218 10*3/uL (ref 150–400)
RBC: 4.2 MIL/uL — ABNORMAL LOW (ref 4.22–5.81)
RDW: 12.3 % (ref 11.5–15.5)
WBC: 7.7 10*3/uL (ref 4.0–10.5)
nRBC: 0 % (ref 0.0–0.2)

## 2022-01-11 LAB — COMPREHENSIVE METABOLIC PANEL
ALT: 74 U/L — ABNORMAL HIGH (ref 0–44)
AST: 33 U/L (ref 15–41)
Albumin: 3.6 g/dL (ref 3.5–5.0)
Alkaline Phosphatase: 65 U/L (ref 38–126)
Anion gap: 4 — ABNORMAL LOW (ref 5–15)
BUN: 14 mg/dL (ref 6–20)
CO2: 28 mmol/L (ref 22–32)
Calcium: 8.7 mg/dL — ABNORMAL LOW (ref 8.9–10.3)
Chloride: 104 mmol/L (ref 98–111)
Creatinine, Ser: 0.75 mg/dL (ref 0.61–1.24)
GFR, Estimated: >60 mL/min (ref >60)
Glucose, Bld: 135 mg/dL — ABNORMAL HIGH (ref 70–99)
Potassium: 3.9 mmol/L (ref 3.5–5.1)
Sodium: 136 mmol/L (ref 135–145)
Total Bilirubin: 0.3 mg/dL (ref 0.3–1.2)
Total Protein: 6.3 g/dL — ABNORMAL LOW (ref 6.5–8.1)

## 2022-01-11 LAB — ETHANOL: Alcohol, Ethyl (B): <10 mg/dL (ref <10)

## 2022-01-11 NOTE — ED Notes (Signed)
Pt had multi purpose tool out walk around lobby. This EMT took same and it was given to Network engineer at check in.

## 2022-01-11 NOTE — ED Notes (Signed)
Pt called 3x no answer  
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
## 2022-01-11 NOTE — ED Notes (Signed)
Called for pt 3X for Triage and 3X for Xray no answer pt left the Hospital.

## 2022-02-18 ENCOUNTER — Inpatient Hospital Stay (HOSPITAL_COMMUNITY)
Admission: EM | Admit: 2022-02-18 | Discharge: 2022-02-23 | DRG: 091 | Disposition: A | Discharge status: Other Acute Inpatient Hospital | Payer: Commercial Managed Care - HMO | Attending: Internal Medicine | Admitting: Internal Medicine

## 2022-02-18 ENCOUNTER — Emergency Department (HOSPITAL_COMMUNITY): Payer: Commercial Managed Care - HMO

## 2022-02-18 ENCOUNTER — Encounter (HOSPITAL_COMMUNITY): Payer: Self-pay

## 2022-02-18 ENCOUNTER — Other Ambulatory Visit: Payer: Self-pay

## 2022-02-18 DIAGNOSIS — F39 Unspecified mood [affective] disorder: Secondary | ICD-10-CM | POA: Diagnosis present

## 2022-02-18 DIAGNOSIS — E162 Hypoglycemia, unspecified: Secondary | ICD-10-CM | POA: Diagnosis present

## 2022-02-18 DIAGNOSIS — R739 Hyperglycemia, unspecified: Secondary | ICD-10-CM | POA: Diagnosis present

## 2022-02-18 DIAGNOSIS — Z91148 Patient's other noncompliance with medication regimen for other reason: Secondary | ICD-10-CM

## 2022-02-18 DIAGNOSIS — F23 Brief psychotic disorder: Secondary | ICD-10-CM | POA: Diagnosis present

## 2022-02-18 DIAGNOSIS — G928 Other toxic encephalopathy: Principal | ICD-10-CM | POA: Diagnosis present

## 2022-02-18 DIAGNOSIS — Z91013 Allergy to seafood: Secondary | ICD-10-CM

## 2022-02-18 DIAGNOSIS — Z813 Family history of other psychoactive substance abuse and dependence: Secondary | ICD-10-CM | POA: Diagnosis not present

## 2022-02-18 DIAGNOSIS — J9600 Acute respiratory failure, unspecified whether with hypoxia or hypercapnia: Principal | ICD-10-CM

## 2022-02-18 DIAGNOSIS — F17210 Nicotine dependence, cigarettes, uncomplicated: Secondary | ICD-10-CM | POA: Diagnosis present

## 2022-02-18 DIAGNOSIS — E440 Moderate protein-calorie malnutrition: Secondary | ICD-10-CM | POA: Diagnosis present

## 2022-02-18 DIAGNOSIS — G9340 Encephalopathy, unspecified: Secondary | ICD-10-CM | POA: Diagnosis not present

## 2022-02-18 DIAGNOSIS — B182 Chronic viral hepatitis C: Secondary | ICD-10-CM | POA: Diagnosis present

## 2022-02-18 DIAGNOSIS — F419 Anxiety disorder, unspecified: Secondary | ICD-10-CM | POA: Diagnosis present

## 2022-02-18 DIAGNOSIS — J9601 Acute respiratory failure with hypoxia: Secondary | ICD-10-CM | POA: Diagnosis present

## 2022-02-18 DIAGNOSIS — Z8614 Personal history of Methicillin resistant Staphylococcus aureus infection: Secondary | ICD-10-CM

## 2022-02-18 DIAGNOSIS — Z781 Physical restraint status: Secondary | ICD-10-CM | POA: Diagnosis not present

## 2022-02-18 DIAGNOSIS — F1990 Other psychoactive substance use, unspecified, uncomplicated: Secondary | ICD-10-CM | POA: Diagnosis not present

## 2022-02-18 DIAGNOSIS — F1914 Other psychoactive substance abuse with psychoactive substance-induced mood disorder: Secondary | ICD-10-CM | POA: Diagnosis present

## 2022-02-18 DIAGNOSIS — Z1152 Encounter for screening for COVID-19: Secondary | ICD-10-CM

## 2022-02-18 DIAGNOSIS — G929 Unspecified toxic encephalopathy: Secondary | ICD-10-CM

## 2022-02-18 DIAGNOSIS — Z833 Family history of diabetes mellitus: Secondary | ICD-10-CM | POA: Diagnosis not present

## 2022-02-18 DIAGNOSIS — Z888 Allergy status to other drugs, medicaments and biological substances status: Secondary | ICD-10-CM | POA: Diagnosis not present

## 2022-02-18 DIAGNOSIS — Z818 Family history of other mental and behavioral disorders: Secondary | ICD-10-CM | POA: Diagnosis not present

## 2022-02-18 DIAGNOSIS — Z681 Body mass index (BMI) 19 or less, adult: Secondary | ICD-10-CM

## 2022-02-18 DIAGNOSIS — Z91199 Patient's noncompliance with other medical treatment and regimen due to unspecified reason: Secondary | ICD-10-CM

## 2022-02-18 LAB — SALICYLATE LEVEL: Salicylate Lvl: <7 mg/dL — ABNORMAL LOW (ref 7.0–30.0)

## 2022-02-18 LAB — COMPREHENSIVE METABOLIC PANEL
ALT: 51 U/L — ABNORMAL HIGH (ref 0–44)
AST: 30 U/L (ref 15–41)
Albumin: 4.1 g/dL (ref 3.5–5.0)
Alkaline Phosphatase: 73 U/L (ref 38–126)
Anion gap: 6 (ref 5–15)
BUN: 15 mg/dL (ref 6–20)
CO2: 29 mmol/L (ref 22–32)
Calcium: 9 mg/dL (ref 8.9–10.3)
Chloride: 102 mmol/L (ref 98–111)
Creatinine, Ser: 0.85 mg/dL (ref 0.61–1.24)
GFR, Estimated: >60 mL/min (ref >60)
Glucose, Bld: 83 mg/dL (ref 70–99)
Potassium: 5 mmol/L (ref 3.5–5.1)
Sodium: 137 mmol/L (ref 135–145)
Total Bilirubin: 1 mg/dL (ref 0.3–1.2)
Total Protein: 6.9 g/dL (ref 6.5–8.1)

## 2022-02-18 LAB — CBC WITH DIFFERENTIAL/PLATELET
Abs Immature Granulocytes: 0.02 10*3/uL (ref 0.00–0.07)
Basophils Absolute: 0 10*3/uL (ref 0.0–0.1)
Basophils Relative: 1 %
Eosinophils Absolute: 0.1 10*3/uL (ref 0.0–0.5)
Eosinophils Relative: 1 %
HCT: 38.4 % — ABNORMAL LOW (ref 39.0–52.0)
Hemoglobin: 13 g/dL (ref 13.0–17.0)
Immature Granulocytes: 0 %
Lymphocytes Relative: 29 %
Lymphs Abs: 2.3 10*3/uL (ref 0.7–4.0)
MCH: 31.3 pg (ref 26.0–34.0)
MCHC: 33.9 g/dL (ref 30.0–36.0)
MCV: 92.3 fL (ref 80.0–100.0)
Monocytes Absolute: 0.7 10*3/uL (ref 0.1–1.0)
Monocytes Relative: 9 %
Neutro Abs: 4.7 10*3/uL (ref 1.7–7.7)
Neutrophils Relative %: 60 %
Platelets: 251 10*3/uL (ref 150–400)
RBC: 4.16 MIL/uL — ABNORMAL LOW (ref 4.22–5.81)
RDW: 12.9 % (ref 11.5–15.5)
WBC: 7.9 10*3/uL (ref 4.0–10.5)
nRBC: 0 % (ref 0.0–0.2)

## 2022-02-18 LAB — CBC
HCT: 41.7 % (ref 39.0–52.0)
Hemoglobin: 14.3 g/dL (ref 13.0–17.0)
MCH: 31.2 pg (ref 26.0–34.0)
MCHC: 34.3 g/dL (ref 30.0–36.0)
MCV: 91 fL (ref 80.0–100.0)
Platelets: 251 K/uL (ref 150–400)
RBC: 4.58 MIL/uL (ref 4.22–5.81)
RDW: 12.8 % (ref 11.5–15.5)
WBC: 8.7 K/uL (ref 4.0–10.5)
nRBC: 0 % (ref 0.0–0.2)

## 2022-02-18 LAB — BLOOD GAS, ARTERIAL
Acid-Base Excess: 6.4 mmol/L — ABNORMAL HIGH (ref 0.0–2.0)
Bicarbonate: 30.5 mmol/L — ABNORMAL HIGH (ref 20.0–28.0)
Drawn by: 11249
FIO2: 100 %
MECHVT: 480 mL
O2 Saturation: 100 %
PEEP: 5 cmH2O
Patient temperature: 36.6
RATE: 18 resp/min
pCO2 arterial: 40 mmHg (ref 32–48)
pH, Arterial: 7.49 — ABNORMAL HIGH (ref 7.35–7.45)
pO2, Arterial: 473 mmHg — ABNORMAL HIGH (ref 83–108)

## 2022-02-18 LAB — CK: Total CK: 106 U/L (ref 49–397)

## 2022-02-18 LAB — ETHANOL: Alcohol, Ethyl (B): <10 mg/dL (ref <10)

## 2022-02-18 LAB — ACETAMINOPHEN LEVEL: Acetaminophen (Tylenol), Serum: <10 ug/mL — ABNORMAL LOW (ref 10–30)

## 2022-02-18 LAB — TROPONIN I (HIGH SENSITIVITY)
Troponin I (High Sensitivity): <2 ng/L (ref <18)
Troponin I (High Sensitivity): <2 ng/L (ref <18)

## 2022-02-18 LAB — LIPASE, BLOOD: Lipase: 27 U/L (ref 11–51)

## 2022-02-18 LAB — LACTIC ACID, PLASMA: Lactic Acid, Venous: 0.9 mmol/L (ref 0.5–1.9)

## 2022-02-18 MED ORDER — MIDAZOLAM HCL 2 MG/2ML IJ SOLN
2.0000 mg | INTRAMUSCULAR | Status: AC | PRN
  Administered 2022-02-18 (×2): 2 mg via INTRAVENOUS
  Filled 2022-02-18 (×2): qty 2

## 2022-02-18 MED ORDER — FENTANYL CITRATE PF 50 MCG/ML IJ SOSY
50.0000 ug | PREFILLED_SYRINGE | INTRAMUSCULAR | Status: DC | PRN
  Administered 2022-02-18 – 2022-02-19 (×2): 50 ug via INTRAVENOUS
  Filled 2022-02-18: qty 1

## 2022-02-18 MED ORDER — DOCUSATE SODIUM 50 MG/5ML PO LIQD: 100.0000 mg | Freq: Two times a day (BID) | ORAL | Status: DC | PRN

## 2022-02-18 MED ORDER — FENTANYL CITRATE PF 50 MCG/ML IJ SOSY
50.0000 ug | PREFILLED_SYRINGE | INTRAMUSCULAR | Status: DC | PRN
  Administered 2022-02-19 (×2): 200 ug via INTRAVENOUS
  Administered 2022-02-19: 50 ug via INTRAVENOUS
  Filled 2022-02-18 (×2): qty 4

## 2022-02-18 MED ORDER — FENTANYL CITRATE PF 50 MCG/ML IJ SOSY
50.0000 ug | PREFILLED_SYRINGE | INTRAMUSCULAR | Status: DC | PRN
  Filled 2022-02-18: qty 4

## 2022-02-18 MED ORDER — ETOMIDATE 2 MG/ML IV SOLN
INTRAVENOUS | Status: AC
  Administered 2022-02-18: 20 mg
  Filled 2022-02-18: qty 10

## 2022-02-18 MED ORDER — PROPOFOL 10 MG/ML IV BOLUS
INTRAVENOUS | Status: AC
  Filled 2022-02-18: qty 20

## 2022-02-18 MED ORDER — ROCURONIUM BROMIDE 10 MG/ML (PF) SYRINGE
PREFILLED_SYRINGE | INTRAVENOUS | Status: AC
  Administered 2022-02-18: 100 mg
  Filled 2022-02-18: qty 10

## 2022-02-18 MED ORDER — ZIPRASIDONE MESYLATE 20 MG IM SOLR
20.0000 mg | Freq: Once | INTRAMUSCULAR | Status: AC
  Administered 2022-02-18: 20 mg via INTRAMUSCULAR
  Filled 2022-02-18: qty 20

## 2022-02-18 MED ORDER — KETAMINE HCL 50 MG/5ML IJ SOSY
1.0000 mg/kg | PREFILLED_SYRINGE | Freq: Once | INTRAMUSCULAR | Status: AC
  Administered 2022-02-18: 59 mg via INTRAVENOUS
  Filled 2022-02-18: qty 10

## 2022-02-18 MED ORDER — MIDAZOLAM HCL 2 MG/2ML IJ SOLN
2.0000 mg | INTRAMUSCULAR | Status: DC | PRN
  Administered 2022-02-19 – 2022-02-20 (×8): 2 mg via INTRAVENOUS
  Filled 2022-02-18 (×8): qty 2

## 2022-02-18 MED ORDER — POLYETHYLENE GLYCOL 3350 17 G PO PACK
17.0000 g | PACK | Freq: Every day | ORAL | Status: DC
  Administered 2022-02-19: 17 g
  Filled 2022-02-18: qty 1

## 2022-02-18 MED ORDER — LORAZEPAM 2 MG/ML IJ SOLN
2.0000 mg | Freq: Once | INTRAMUSCULAR | Status: AC
  Administered 2022-02-18: 2 mg via INTRAVENOUS
  Filled 2022-02-18: qty 1

## 2022-02-18 MED ORDER — MIDAZOLAM HCL 2 MG/2ML IJ SOLN
INTRAMUSCULAR | Status: AC
  Administered 2022-02-18: 2 mg
  Filled 2022-02-18: qty 2

## 2022-02-18 MED ORDER — POLYETHYLENE GLYCOL 3350 17 G PO PACK: 17.0000 g | PACK | Freq: Every day | ORAL | Status: DC | PRN

## 2022-02-18 MED ORDER — FENTANYL CITRATE PF 50 MCG/ML IJ SOSY
50.0000 ug | PREFILLED_SYRINGE | INTRAMUSCULAR | Status: AC | PRN
  Administered 2022-02-18 – 2022-02-19 (×3): 50 ug via INTRAVENOUS
  Filled 2022-02-18 (×3): qty 1

## 2022-02-18 MED ORDER — PROPOFOL 1000 MG/100ML IV EMUL
INTRAVENOUS | Status: AC
  Filled 2022-02-18: qty 100

## 2022-02-18 MED ORDER — PROPOFOL 1000 MG/100ML IV EMUL
0.0000 ug/kg/min | INTRAVENOUS | Status: DC
  Administered 2022-02-19: 50 ug/kg/min via INTRAVENOUS
  Administered 2022-02-19: 40 ug/kg/min via INTRAVENOUS
  Administered 2022-02-19 – 2022-02-20 (×3): 50 ug/kg/min via INTRAVENOUS
  Filled 2022-02-18 (×5): qty 100

## 2022-02-18 MED ORDER — LACTATED RINGERS IV BOLUS
1000.0000 mL | Freq: Once | INTRAVENOUS | Status: AC
  Administered 2022-02-19: 1000 mL via INTRAVENOUS

## 2022-02-18 MED ORDER — PANTOPRAZOLE 2 MG/ML SUSPENSION
40.0000 mg | Freq: Every day | ORAL | Status: DC
  Administered 2022-02-19: 40 mg
  Filled 2022-02-18: qty 20

## 2022-02-18 MED ORDER — ENOXAPARIN SODIUM 40 MG/0.4ML IJ SOSY
40.0000 mg | PREFILLED_SYRINGE | INTRAMUSCULAR | Status: DC
  Administered 2022-02-19 – 2022-02-22 (×4): 40 mg via SUBCUTANEOUS
  Filled 2022-02-18 (×4): qty 0.4

## 2022-02-18 MED ORDER — PROPOFOL 1000 MG/100ML IV EMUL: 5.0000 ug/kg/min | INTRAVENOUS | Status: DC

## 2022-02-18 MED ORDER — LACTATED RINGERS IV BOLUS
1000.0000 mL | Freq: Once | INTRAVENOUS | Status: AC
  Administered 2022-02-18: 1000 mL via INTRAVENOUS

## 2022-02-18 MED ORDER — DOCUSATE SODIUM 50 MG/5ML PO LIQD
100.0000 mg | Freq: Two times a day (BID) | ORAL | Status: DC
  Administered 2022-02-19 (×2): 100 mg
  Filled 2022-02-18 (×3): qty 10

## 2022-02-18 NOTE — ED Notes (Signed)
Pt given 2mg  versed at 2237 Pt given etomidate at [place] Intubated 2242 22 at the lip
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
## 2022-02-18 NOTE — H&P (Incomplete)
NAME:  Joseph [address] 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 Pt's mother had reported to EMS that he uses bath salts, has been eating spoiled food recently. He was acutely agitated on arrival to ED requiring multiple sedating medications for safety. Ultimately he required intubation.  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 rate (!) Joseph, height 5\' 8"  (1.727 m), weight 59 kg, SpO2 100 %.    Vent Mode: PRVC FiO2 (%):  [100 %] 100 % Set Rate:  [18 bmp] 18 bmp Vt Set:  [480 mL] 480 mL PEEP:  [5 cmH20] 5 cmH20 Plateau Pressure:  [11 cmH20] 11 cmH20  No intake or output data in the Joseph hours ending 02/18/22 2354 Filed Weights   02/18/22 2127  Weight: 59 kg    Examination: General appearance: 26 y.o., male, intubated, dissheveled Eyes: sclera injected, PERRL, tracking appropriately HENT:ETT in place Lungs: coarse bl, equal chest rise CV: tachy RR, no murmur  Abdomen: Soft, non-tender; non-distended, BS present  Extremities: No peripheral edema, warm Skin: Normal turgor and texture; no rash Neuro: moves all ext equally, grossly nonfocal, no clonus   Resolved Hospital Problem list    Assessment & Plan:   Acute  toxic encephalopathy   Acute hypoxic respiratory failure  Chronic HCV  Substance induced mood disorder vs BiPD Polysubstance use   Best Practice (right click and "Reselect all SmartList Selections" daily)   Diet/type: {diet type:25684} DVT prophylaxis: {anticoagulation (Optional):25687} GI prophylaxis: {TM:54650} Lines: {Central Venous Access:25771} Foley:  {Central Venous Access:25691} Code Status:  {Code Status:26939} Last date of multidisciplinary goals of care discussion [***]  Labs   CBC: Recent Labs  Lab 02/18/22 1930 02/18/22 2300  WBC 7.9 8.7  NEUTROABS 4.7  --   HGB 13.0 14.3  HCT 38.4* 41.7  MCV 92.3 91.0  PLT 251 354    Basic Metabolic Panel: Recent Labs  Lab 02/18/22 2144  NA 137  K 5.0  CL 102  CO2 29  GLUCOSE 83  BUN 15  CREATININE 0.85  CALCIUM 9.0   GFR: Estimated Creatinine Clearance: 110.9 mL/min (by C-G formula based on SCr of 0.85 mg/dL). Recent Labs  Lab 02/18/22 1930 02/18/22 2144 02/18/22 2300  WBC 7.9  --  8.7  LATICACIDVEN  --  0.9  --     Liver Function Tests: Recent Labs  Lab 02/18/22 2144  AST 30  ALT 51*  ALKPHOS 73  BILITOT 1.0  PROT 6.9  ALBUMIN 4.1   Recent Labs  Lab 02/18/22 1930  LIPASE 27   No results for input(s): "AMMONIA" in the last 168 hours.  ABG    Component Value Date/Time   PHART 7.49 (H) 02/18/2022 2307   PCO2ART 40 02/18/2022 2307   PO2ART 473 (H) 02/18/2022 2307   HCO3  30.5 (H) 02/18/2022 2307   TCO2 26 11/08/2021 0519   ACIDBASEDEF 1.0 11/08/2021 0519   O2SAT 100 02/18/2022 2307     Coagulation Profile: No results for input(s): "INR", "PROTIME" in the last 168 hours.  Cardiac Enzymes: Recent Labs  Lab 02/18/22 1930  CKTOTAL 106    HbA1C: No results found for: "HGBA1C"  CBG: No results for input(s): "GLUCAP" in the last 168 hours.  Review of Systems:   ***  Past Medical History:  He,  has a past medical history of Cocaine abuse ([place]), Heroin abuse ([place]), IVDU  (intravenous drug user), MRSA (methicillin resistant staph aureus) culture positive, Substance abuse ([place]), and Tobacco abuse.   Surgical History:   Past Surgical History:  Procedure Laterality Date  . IRRIGATION AND DEBRIDEMENT ABSCESS N/A 03/08/2017   Procedure: IRRIGATION AND DEBRIDEMENT LEFT CHEST ABSCESS;  Surgeon: Michael Boston, MD;  Location: [facility];  Service: General;  Laterality: N/A;  . TEE WITHOUT CARDIOVERSION N/A 03/08/2017   Procedure: TRANSESOPHAGEAL ECHOCARDIOGRAM (TEE);  Surgeon: Skeet Latch, MD;  Location: [facility];  Service: Cardiovascular;  Laterality: N/A;     Social History:   reports that he has been smoking cigarettes. He has been smoking an average of .25 packs per day. He has never used smokeless tobacco. He reports current alcohol use. He reports current drug use. Drugs: Amphetamines, Fentanyl, IV, Marijuana, MDMA (Ecstacy), Benzodiazepines, "Crack" cocaine, Heroin, Cocaine, and Methamphetamines.   Family History:  His family history includes Diabetes Mellitus II in his sister; Drug abuse in his mother; Heart disease in his mother.   Allergies Allergies  Allergen Reactions  . Fish Allergy Hives    Catfish  . Suboxone [Buprenorphine Hcl-Naloxone Hcl] Anaphylaxis     Home Medications  Prior to Admission medications   Medication Sig Start Date End Date Taking? Authorizing Provider  acetaminophen (TYLENOL) 500 MG tablet Take 1,000 mg by mouth every 6 (six) hours as needed for moderate pain.    [provider]  benzocaine (ORAJEL) 10 % mucosal gel Use as directed 1 Application in the mouth or throat daily as needed for mouth pain.    [provider]     Critical care time: ***

## 2022-02-19 ENCOUNTER — Inpatient Hospital Stay (HOSPITAL_COMMUNITY): Payer: Commercial Managed Care - HMO

## 2022-02-19 DIAGNOSIS — G9340 Encephalopathy, unspecified: Secondary | ICD-10-CM | POA: Diagnosis not present

## 2022-02-19 LAB — COMPREHENSIVE METABOLIC PANEL
ALT: 50 U/L — ABNORMAL HIGH (ref 0–44)
AST: 21 U/L (ref 15–41)
Albumin: 4 g/dL (ref 3.5–5.0)
Alkaline Phosphatase: 73 U/L (ref 38–126)
Anion gap: 7 (ref 5–15)
BUN: 12 mg/dL (ref 6–20)
CO2: 27 mmol/L (ref 22–32)
Calcium: 8.9 mg/dL (ref 8.9–10.3)
Chloride: 104 mmol/L (ref 98–111)
Creatinine, Ser: 0.57 mg/dL — ABNORMAL LOW (ref 0.61–1.24)
GFR, Estimated: >60 mL/min (ref >60)
Glucose, Bld: 112 mg/dL — ABNORMAL HIGH (ref 70–99)
Potassium: 3.6 mmol/L (ref 3.5–5.1)
Sodium: 138 mmol/L (ref 135–145)
Total Bilirubin: 0.8 mg/dL (ref 0.3–1.2)
Total Protein: 6.8 g/dL (ref 6.5–8.1)

## 2022-02-19 LAB — GLUCOSE, CAPILLARY
Glucose-Capillary: 75 mg/dL (ref 70–99)
Glucose-Capillary: 87 mg/dL (ref 70–99)
Glucose-Capillary: 87 mg/dL (ref 70–99)
Glucose-Capillary: 87 mg/dL (ref 70–99)
Glucose-Capillary: 96 mg/dL (ref 70–99)
Glucose-Capillary: 97 mg/dL (ref 70–99)

## 2022-02-19 LAB — MAGNESIUM: Magnesium: 2.1 mg/dL (ref 1.7–2.4)

## 2022-02-19 LAB — URINALYSIS, ROUTINE W REFLEX MICROSCOPIC
Bilirubin Urine: NEGATIVE
Glucose, UA: NEGATIVE mg/dL
Hgb urine dipstick: NEGATIVE
Ketones, ur: NEGATIVE mg/dL
Leukocytes,Ua: NEGATIVE
Nitrite: NEGATIVE
Protein, ur: NEGATIVE mg/dL
Specific Gravity, Urine: 1.019 (ref 1.005–1.030)
pH: 7 (ref 5.0–8.0)

## 2022-02-19 LAB — LACTIC ACID, PLASMA: Lactic Acid, Venous: 0.7 mmol/L (ref 0.5–1.9)

## 2022-02-19 LAB — MRSA NEXT GEN BY PCR, NASAL: MRSA by PCR Next Gen: DETECTED — AB

## 2022-02-19 LAB — RAPID URINE DRUG SCREEN, HOSP PERFORMED
Amphetamines: POSITIVE — AB
Barbiturates: NOT DETECTED
Benzodiazepines: POSITIVE — AB
Cocaine: POSITIVE — AB
Opiates: NOT DETECTED
Tetrahydrocannabinol: POSITIVE — AB

## 2022-02-19 LAB — AMMONIA
Ammonia: 32 umol/L (ref 9–35)
Ammonia: 27 umol/L (ref 9–35)

## 2022-02-19 LAB — TRIGLYCERIDES: Triglycerides: 41 mg/dL (ref <150)

## 2022-02-19 MED ORDER — DEXTROSE IN LACTATED RINGERS 5 % IV SOLN: INTRAVENOUS | Status: AC

## 2022-02-19 MED ORDER — IBUPROFEN 100 MG/5ML PO SUSP
400.0000 mg | Freq: Four times a day (QID) | ORAL | Status: DC | PRN
  Administered 2022-02-19: 400 mg
  Filled 2022-02-19 (×2): qty 20

## 2022-02-19 MED ORDER — QUETIAPINE FUMARATE 50 MG PO TABS
25.0000 mg | ORAL_TABLET | Freq: Two times a day (BID) | ORAL | Status: DC
  Administered 2022-02-19 – 2022-02-20 (×3): 25 mg
  Filled 2022-02-19 (×3): qty 1

## 2022-02-19 MED ORDER — MUPIROCIN 2 % EX OINT
1.0000 | TOPICAL_OINTMENT | Freq: Two times a day (BID) | CUTANEOUS | Status: DC
  Administered 2022-02-19 – 2022-02-22 (×7): 1 via NASAL
  Filled 2022-02-19 (×2): qty 22

## 2022-02-19 MED ORDER — FENTANYL BOLUS VIA INFUSION
50.0000 ug | INTRAVENOUS | Status: DC | PRN
  Administered 2022-02-19 – 2022-02-20 (×4): 100 ug via INTRAVENOUS

## 2022-02-19 MED ORDER — FENTANYL BOLUS VIA INFUSION
50.0000 ug | INTRAVENOUS | Status: DC | PRN
  Administered 2022-02-19 (×6): 50 ug via INTRAVENOUS

## 2022-02-19 MED ORDER — ORAL CARE MOUTH RINSE: 15.0000 mL | OROMUCOSAL | Status: DC | PRN

## 2022-02-19 MED ORDER — CHLORHEXIDINE GLUCONATE CLOTH 2 % EX PADS: 6.0000 | MEDICATED_PAD | Freq: Every day | CUTANEOUS | Status: DC

## 2022-02-19 MED ORDER — CHLORHEXIDINE GLUCONATE CLOTH 2 % EX PADS
6.0000 | MEDICATED_PAD | Freq: Every day | CUTANEOUS | Status: DC
  Administered 2022-02-20 – 2022-02-21 (×2): 6 via TOPICAL

## 2022-02-19 MED ORDER — THIAMINE HCL 100 MG/ML IJ SOLN
100.0000 mg | Freq: Every day | INTRAMUSCULAR | Status: DC
  Administered 2022-02-19 – 2022-02-21 (×2): 100 mg via INTRAVENOUS
  Filled 2022-02-19 (×2): qty 2

## 2022-02-19 MED ORDER — ORAL CARE MOUTH RINSE
15.0000 mL | OROMUCOSAL | Status: DC
  Administered 2022-02-19 – 2022-02-20 (×15): 15 mL via OROMUCOSAL

## 2022-02-19 MED ORDER — FENTANYL 2500MCG IN NS 250ML (10MCG/ML) PREMIX INFUSION
0.0000 ug/h | INTRAVENOUS | Status: DC
  Administered 2022-02-19: 200 ug/h via INTRAVENOUS
  Administered 2022-02-19: 350 ug/h via INTRAVENOUS
  Administered 2022-02-19: 25 ug/h via INTRAVENOUS
  Administered 2022-02-20: 300 ug/h via INTRAVENOUS
  Filled 2022-02-19 (×4): qty 250

## 2022-02-19 NOTE — Progress Notes (Signed)
Initial Nutrition Assessment  DOCUMENTATION CODES:   Non-severe (moderate) malnutrition in context of chronic illness  INTERVENTION:   Monitor magnesium, potassium, and phosphorus BID for at least 3 days, MD to replete as needed, as pt is at risk for refeeding syndrome.   If unable to extubated within 24 hours: -Initiate Vital AF 1.2 @ 20 ml/hr, advance by 10 ml every  8 hours to goal rate of 60 ml/hr. -Provides 1728 kcals, 108g protein and 1167 ml H2O  NUTRITION DIAGNOSIS:   Moderate Malnutrition related to chronic illness (polysubstance abuse) as evidenced by moderate fat depletion, moderate muscle depletion.  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 Pt's mother had reported to EMS that he uses bath salts, has been eating spoiled food recently. He was acutely agitated on arrival to ED requiring multiple sedating medications for safety. Ultimately he required intubation.  Patient sedated and intubated. Spoke with RN at bedside, no weaning today. Propofol decreased during visit.   Suspect pt's PO intakes have not been adequate given polysubstance abuse. Would monitor for refeeding syndrome if tube feeding started or diet advanced.  Patient is currently intubated on ventilator support MV: 9.5 L/min Temp (24hrs), Avg:98.2 F (36.8 C), Min:97.3 F (36.3 C), Max:99.8 F (37.7 C)  Propofol: 14.16 ml/hr -> provides 373 fat kcals  Medications: Colace, Miralax, Thiamine, D5 infusion, Fentanyl, Lactated ringers  Labs reviewed: CBGs: 87-96  NUTRITION - FOCUSED  PHYSICAL EXAM:  Flowsheet Row Most Recent Value  Orbital Region No depletion  Upper Arm Region Severe depletion  Thoracic and Lumbar Region Unable to assess  Buccal Region Mild depletion  Temple Region Moderate depletion  Clavicle Bone Region Moderate depletion  Clavicle and Acromion Bone Region Moderate depletion  Scapular Bone Region Moderate depletion  Dorsal Hand Unable to assess  [mitts]  Patellar Region Mild depletion  [redness, scratches]  Anterior Thigh Region Mild depletion  Posterior Calf Region Mild depletion  Edema (RD Assessment) Mild  Hair Reviewed  Eyes Unable to assess  Mouth Unable to assess  Skin Reviewed  [markings, scratches, rednes of BLEs]  Nails Unable to assess       Diet Order:   Diet Order             Diet NPO time specified  Diet effective now                   EDUCATION NEEDS:   Not appropriate for education at this time  Skin:  Skin Assessment: Reviewed RN Assessment  Last BM:  PTA  Height:   Ht Readings from Last 1 Encounters:  02/18/22 5\' 8"  (1.727 m)    Weight:   Wt Readings from Last 1 Encounters:  02/19/22 56.6 kg    BMI:  Body mass index is 18.97 kg/m.  Estimated Nutritional Needs:   Kcal:  1610  Protein:  85-95g  Fluid:  1.9L/day  Clayton Bibles, MS, RD, LDN Inpatient Clinical Dietitian Contact information available via Amion

## 2022-02-19 NOTE — Consult Note (Addendum)
[place] Psychiatry Consult   Reason for Consult:  "Hx of Psychosis" Referring Physician: Freda Jackson Patient Identification: Joseph Nunez Principal Diagnosis: Acute encephalopathy Diagnosis:  Principal Problem:   Acute encephalopathy   Total Time spent with patient: 15 minutes  Subjective:    Joseph Nunez is a 26 y.o. male who was seen and evaluated face-to-face by this provider.  Patient is currently intubated and unable to participate in assessment. Patient has a charted history with substance abuse mood disorder.    Per HPI "mother reports patient has been using bath salts and eating spoiled food.  Was reported patient was acutely agitated on arrival to the emergency department."  HPI: Additional collateral to be obtained after initial assessment.  Please reconsult psychiatry when patient is able to participate.   Past Psychiatric History: Chart review persons currently prescribed Seroquel 25 mg p.o. twice daily, IM Geodon on 02/18/2022  Risk to Self:   Risk to Others:   Prior Inpatient Therapy:   Prior Outpatient Therapy:    Past Medical History:  Past Medical History:  Diagnosis Date   Cocaine abuse ([place])    Heroin abuse ([place])    IVDU (intravenous drug user)    MRSA (methicillin resistant staph aureus) culture positive    Substance abuse ([place])    Tobacco abuse     Past Surgical History:  Procedure Laterality Date   IRRIGATION AND DEBRIDEMENT ABSCESS N/A 03/08/2017   Procedure: IRRIGATION AND DEBRIDEMENT LEFT CHEST ABSCESS;  Surgeon: Michael Boston, MD;  Location: [facility];  Service: General;  Laterality: N/A;   TEE WITHOUT CARDIOVERSION N/A 03/08/2017   Procedure: TRANSESOPHAGEAL ECHOCARDIOGRAM (TEE);  Surgeon: Skeet Latch, MD;  Location: [facility] ENDOSCOPY;  Service: Cardiovascular;  Laterality: N/A;   Family History:  Family History  Problem Relation Age of Onset   Drug abuse Mother    Heart disease Mother     Diabetes Mellitus II Sister    Family Psychiatric  History:  Social History:  Social History   Substance and Sexual Activity  Alcohol Use Yes     Social History   Substance and Sexual Activity  Drug Use Yes   Types: Amphetamines, Fentanyl, IV, Marijuana, MDMA (Ecstacy), Benzodiazepines, "Crack" cocaine, Heroin, Cocaine, Methamphetamines   Comment: heroin,     Social History   Socioeconomic History   Marital status: Single    Spouse name: Not on file   Number of children: Not on file   Years of education: Not on file   Highest education level: High school graduate  Occupational History   Not on file  Tobacco Use   Smoking status: Every Day    Packs/day: 0.25    Types: Cigarettes   Smokeless tobacco: Never  Vaping Use   Vaping Use: Never used  Substance and Sexual Activity   Alcohol use: Yes   Drug use: Yes    Types: Amphetamines, Fentanyl, IV, Marijuana, MDMA (Ecstacy), Benzodiazepines, "Crack" cocaine, Heroin, Cocaine, Methamphetamines    Comment: heroin,    Sexual activity: Not Currently  Other Topics Concern   Not on file  Social History Narrative   ** Merged History Encounter **       Social Determinants of Health   Financial Resource Strain: Not on file  Food Insecurity: Food Insecurity Present (11/12/2021)   Hunger Vital Sign    Worried About Running Out of Food in the Last Year: Often true    Ran Out of Food in the  Last Year: Often true  Transportation Needs: Not on file  Physical Activity: Not on file  Stress: Not on file  Social Connections: Not on file   Additional Social History:    Allergies:   Allergies  Allergen Reactions   Fish Allergy Hives    Catfish   Suboxone [Buprenorphine Hcl-Naloxone Hcl] Anaphylaxis    Labs:  Results for orders placed or performed during the hospital encounter of 02/18/22 (from the past 48 hour(s))  CBC with Differential     Status: Abnormal   Collection Time: 02/18/22  7:30 PM  Result Value Ref Range    WBC 7.9 4.0 - 10.5 K/uL   RBC 4.16 (L) 4.22 - 5.81 MIL/uL   Hemoglobin 13.0 13.0 - 17.0 g/dL   HCT 38.4 (L) 39.0 - 52.0 %   MCV 92.3 80.0 - 100.0 fL   MCH 31.3 26.0 - 34.0 pg   MCHC 33.9 30.0 - 36.0 g/dL   RDW 12.9 11.5 - 15.5 %   Platelets 251 150 - 400 K/uL   nRBC 0.0 0.0 - 0.2 %   Neutrophils Relative % 60 %   Neutro Abs 4.7 1.7 - 7.7 K/uL   Lymphocytes Relative 29 %   Lymphs Abs 2.3 0.7 - 4.0 K/uL   Monocytes Relative 9 %   Monocytes Absolute 0.7 0.1 - 1.0 K/uL   Eosinophils Relative 1 %   Eosinophils Absolute 0.1 0.0 - 0.5 K/uL   Basophils Relative 1 %   Basophils Absolute 0.0 0.0 - 0.1 K/uL   Immature Granulocytes 0 %   Abs Immature Granulocytes 0.02 0.00 - 0.07 K/uL    Comment: Performed at [facility], [place] [address], [place], [place] [address]  CK     Status: None   Collection Time: 02/18/22  7:30 PM  Result Value Ref Range   Total CK 106 49 - 397 U/L    Comment: Performed at [facility], [place] [address] [place], [place] [address]  Acetaminophen level     Status: Abnormal   Collection Time: 02/18/22  7:30 PM  Result Value Ref Range   Acetaminophen (Tylenol), Serum <10 (L) 10 - 30 ug/mL    Comment: (NOTE) Therapeutic concentrations vary significantly. A range of 10-30 ug/mL  may be an effective concentration for many patients. However, some  are best treated at concentrations outside of this range. Acetaminophen concentrations >150 ug/mL at 4 hours after ingestion  and >50 ug/mL at 12 hours after ingestion are often associated with  toxic reactions.  Performed at [facility], [place] [address] [place], [place] [address]   Salicylate level     Status: Abnormal   Collection Time: 02/18/22  7:30 PM  Result Value Ref Range   Salicylate Lvl <1.2 (L) 7.0 - 30.0 mg/dL    Comment: Performed at [facility], [place] [address], [place], [place] [address]  Ethanol     Status: None   Collection  Time: 02/18/22  7:30 PM  Result Value Ref Range   Alcohol, Ethyl (B) <10 <10 mg/dL    Comment: (NOTE) Lowest detectable limit for serum alcohol is 10 mg/dL.  For medical purposes only. Performed at [facility], [place] [address] [place], [place] [address]   Lipase, blood     Status: None   Collection Time: 02/18/22  7:30 PM  Result Value Ref Range   Lipase 27 11 - 51 U/L    Comment: Performed at [facility] [place] [address], [place], [place] [address]  Troponin I (High Sensitivity)     Status: None   Collection Time: 02/18/22  7:30 PM  Result Value Ref Range   Troponin I (High Sensitivity) <2 <18 ng/L    Comment: (NOTE) Elevated high sensitivity troponin I (hsTnI) values and significant  changes across serial measurements may suggest ACS but many other  chronic and acute conditions are known to elevate hsTnI results.  Refer to the Links section for chest pain algorithms and additional  guidance. Performed at [facility], [place] [address], [place], [place] [address]   Lactic acid, plasma     Status: None   Collection Time: 02/18/22  9:44 PM  Result Value Ref Range   Lactic Acid, Venous 0.9 0.5 - 1.9 mmol/L    Comment: Performed at [facility], [place] [address] [place], [place] [address]  Blood culture (routine x 2)     Status: None (Preliminary result)   Collection Time: 02/18/22  9:44 PM   Specimen: BLOOD  Result Value Ref Range   Specimen Description      BLOOD SITE NOT SPECIFIED Performed at [facility], [place] [address], [place], [place] [address]    Special Requests      BOTTLES DRAWN AEROBIC ONLY Blood Culture results may not be optimal due to an inadequate volume of blood received in culture bottles Performed at [place] [address], [place], [place] [address]    Culture  Setup Time      GRAM POSITIVE RODS AEROBIC BOTTLE ONLY CRITICAL RESULT CALLED TO, READ  BACK BY AND VERIFIED WITH: PHARMD N  [place] 02/19/22 @1251  BY AB Performed at [facility], [place] [address] [place], [place] [address]    Culture GRAM POSITIVE RODS    Report Status PENDING   Comprehensive metabolic panel     Status: Abnormal   Collection Time: 02/18/22  9:44 PM  Result Value Ref Range   Sodium 137 135 - 145 mmol/L    Comment: Per Westley Hummer, K, Dr. Wyvonnia Dusky requests results to be released, despite the marked hemolysis present in sample.  Clinical staff have been advised to the fact that results are affected by hemolysis and may not accurately reflect true physiological values of  patient.     Potassium 5.0 3.5 - 5.1 mmol/L    Comment: MARKED HEMOLYSIS PRESENT.  RESULT MAY BE FALSELY ELEVATED   Chloride 102 98 - 111 mmol/L   CO2 29 22 - 32 mmol/L   Glucose, Bld 83 70 - 99 mg/dL    Comment: Glucose reference range applies only to samples taken after fasting for at least 8 hours.   BUN 15 6 - 20 mg/dL   Creatinine, Ser 0.85 0.61 - 1.24 mg/dL   Calcium 9.0 8.9 - 10.3 mg/dL   Total Protein 6.9 6.5 - 8.1 g/dL   Albumin 4.1 3.5 - 5.0 g/dL   AST 30 15 - 41 U/L    Comment: MARKED HEMOLYSIS PRESENT.  RESULT MAY BE INACCURATE   ALT 51 (H) 0 - 44 U/L    Comment: MARKED HEMOLYSIS PRESENT.  RESULT MAY BE INACCURATE   Alkaline Phosphatase 73 38 - 126 U/L   Total Bilirubin 1.0 0.3 - 1.2 mg/dL   GFR, Estimated >60 >60 mL/min    Comment: (NOTE) Calculated using the CKD-EPI Creatinine Equation (2021)    Anion gap 6 5 - 15    Comment:  Performed at [facility], [place] [address], [place], [place] [address]  Troponin I (High Sensitivity)     Status: None   Collection Time: 02/18/22  9:44 PM  Result Value Ref Range   Troponin I (High Sensitivity) <2 <18 ng/L    Comment: Per Westley Hummer, K, Dr. Wyvonnia Dusky requests results to be released, despite the marked hemolysis present in sample.  Clinical staff have been advised to the fact that results are affected by hemolysis  and may not accurately reflect true physiological values of  patient. (NOTE) Elevated high sensitivity troponin I (hsTnI) values and significant  changes across serial measurements may suggest ACS but many other  chronic and acute conditions are known to elevate hsTnI results.  Refer to the "Links" section for chest pain algorithms and additional  guidance. Performed at [facility], [place] [address], [place], [place] [address]   Lactic acid, plasma     Status: None   Collection Time: 02/18/22 11:00 PM  Result Value Ref Range   Lactic Acid, Venous 0.7 0.5 - 1.9 mmol/L    Comment: Performed at [facility], [place] [address] [place], [place] [address]  Comprehensive metabolic panel     Status: Abnormal   Collection Time: 02/18/22 11:00 PM  Result Value Ref Range   Sodium 138 135 - 145 mmol/L   Potassium 3.6 3.5 - 5.1 mmol/L   Chloride 104 98 - 111 mmol/L   CO2 27 22 - 32 mmol/L   Glucose, Bld 112 (H) 70 - 99 mg/dL    Comment: Glucose reference range applies only to samples taken after fasting for at least 8 hours.   BUN 12 6 - 20 mg/dL   Creatinine, Ser 0.57 (L) 0.61 - 1.24 mg/dL   Calcium 8.9 8.9 - 10.3 mg/dL   Total Protein 6.8 6.5 - 8.1 g/dL   Albumin 4.0 3.5 - 5.0 g/dL   AST 21 15 - 41 U/L   ALT 50 (H) 0 - 44 U/L   Alkaline Phosphatase 73 38 - 126 U/L   Total Bilirubin 0.8 0.3 - 1.2 mg/dL   GFR, Estimated >60 >60 mL/min    Comment: (NOTE) Calculated using the CKD-EPI Creatinine Equation (2021)    Anion gap 7 5 - 15    Comment: Performed at [facility], [place] [address] [place], [place] [address]  CBC     Status: None   Collection Time: 02/18/22 11:00 PM  Result Value Ref Range   WBC 8.7 4.0 - 10.5 K/uL   RBC 4.58 4.22 - 5.81 MIL/uL   Hemoglobin 14.3 13.0 - 17.0 g/dL   HCT 41.7 39.0 - 52.0 %   MCV 91.0 80.0 - 100.0 fL   MCH 31.2 26.0 - 34.0 pg   MCHC 34.3 30.0 - 36.0 g/dL   RDW 12.8 11.5 - 15.5 %   Platelets 251  150 - 400 K/uL   nRBC 0.0 0.0 - 0.2 %    Comment: Performed at [facility], [place] [address] [place], [place] [address]  Triglycerides     Status: None   Collection Time: 02/18/22 11:00 PM  Result Value Ref Range   Triglycerides 41 <150 mg/dL    Comment: Performed at [facility], [place] [address], [place], [place] [address]  Blood gas, arterial (at [facility])     Status: Abnormal   Collection Time: 02/18/22 11:07 PM  Result Value Ref Range   FIO2 100 %  MECHVT 480 mL   RATE 18 resp/min   PEEP 5 cm H20   pH, Arterial 7.49 (H) 7.35 - 7.45   pCO2 arterial 40 32 - 48 mmHg   pO2, Arterial 473 (H) 83 - 108 mmHg   Bicarbonate 30.5 (H) 20.0 - 28.0 mmol/L   Acid-Base Excess 6.4 (H) 0.0 - 2.0 mmol/L   O2 Saturation 100 %   Patient temperature 36.6    Collection site R BRACHIAL    Drawn by [phone]     Comment: Performed at [facility], [place] [address] [place], [place] [address]  Rapid urine drug screen (hospital performed)     Status: Abnormal   Collection Time: 02/18/22 11:15 PM  Result Value Ref Range   Opiates NONE DETECTED NONE DETECTED   Cocaine POSITIVE (A) NONE DETECTED   Benzodiazepines POSITIVE (A) NONE DETECTED   Amphetamines POSITIVE (A) NONE DETECTED   Tetrahydrocannabinol POSITIVE (A) NONE DETECTED   Barbiturates NONE DETECTED NONE DETECTED    Comment: (NOTE) DRUG SCREEN FOR MEDICAL PURPOSES ONLY.  IF CONFIRMATION IS NEEDED FOR ANY PURPOSE, NOTIFY LAB WITHIN 5 DAYS.  LOWEST DETECTABLE LIMITS FOR URINE DRUG SCREEN Drug Class                     Cutoff (ng/mL) Amphetamine and metabolites    1000 Barbiturate and metabolites    200 Benzodiazepine                 330 Tricyclics and metabolites     300 Opiates and metabolites        300 Cocaine and metabolites        300 THC                            50 Performed at [facility], [place] [address], [place], [place] [address]   Urinalysis, Routine w  reflex microscopic Urine, Catheterized     Status: None   Collection Time: 02/18/22 11:15 PM  Result Value Ref Range   Color, Urine YELLOW YELLOW   APPearance CLEAR CLEAR   Specific Gravity, Urine 1.019 1.005 - 1.030   pH 7.0 5.0 - 8.0   Glucose, UA NEGATIVE NEGATIVE mg/dL   Hgb urine dipstick NEGATIVE NEGATIVE   Bilirubin Urine NEGATIVE NEGATIVE   Ketones, ur NEGATIVE NEGATIVE mg/dL   Protein, ur NEGATIVE NEGATIVE mg/dL   Nitrite NEGATIVE NEGATIVE   Leukocytes,Ua NEGATIVE NEGATIVE    Comment: Performed at [facility], [place] [address] [place], [place] [address]  MRSA Next Gen by PCR, Nasal     Status: Abnormal   Collection Time: 02/19/22  2:04 AM   Specimen: Nasal Mucosa; Nasal Swab  Result Value Ref Range   MRSA by PCR Next Gen DETECTED (A) NOT DETECTED    Comment: CRITICAL RESULT CALLED TO, READ BACK BY AND VERIFIED WITH: HARRISON, D @ [phone] (NOTE) The GeneXpert MRSA Assay (FDA approved for NASAL specimens only), is one component of a comprehensive MRSA colonization surveillance program. It is not intended to diagnose MRSA infection nor to guide or monitor treatment for MRSA infections. Test performance is not FDA approved in patients less than 58 years old. Performed at [facility], [place] [address], [place], [place] [address]   Ammonia     Status: None   Collection Time: 02/19/22  2:59 AM  Result Value Ref Range   Ammonia 32  9 - 35 umol/L    Comment: Performed at [facility], [place] [address], [place], [place] [address]  Magnesium     Status: None   Collection Time: 02/19/22  4:29 AM  Result Value Ref Range   Magnesium 2.1 1.7 - 2.4 mg/dL    Comment: Performed at [facility], [place] [address] [place], [place] [address]  Ammonia     Status: None   Collection Time: 02/19/22  4:30 AM  Result Value Ref Range   Ammonia 27 9 - 35 umol/L    Comment: Performed at [facility], [place] [address], [place], [place] [address]  Glucose, capillary     Status: None   Collection Time: 02/19/22  5:07 AM  Result Value Ref Range   Glucose-Capillary 96 70 - 99 mg/dL    Comment: Glucose reference range applies only to samples taken after fasting for at least 8 hours.  Glucose, capillary     Status: None   Collection Time: 02/19/22  8:10 AM  Result Value Ref Range   Glucose-Capillary 87 70 - 99 mg/dL    Comment: Glucose reference range applies only to samples taken after fasting for at least 8 hours.   Comment 1 Notify RN    Comment 2 Document in Chart   Glucose, capillary     Status: None   Collection Time: 02/19/22 12:14 PM  Result Value Ref Range   Glucose-Capillary 87 70 - 99 mg/dL    Comment: Glucose reference range applies only to samples taken after fasting for at least 8 hours.   Comment 1 Notify RN    Comment 2 Document in Chart     Current Facility-Administered Medications  Medication Dose Route Frequency Provider Last Rate Last Admin   Chlorhexidine Gluconate Cloth 2 % PADS 6 each  6 each Topical Daily Maryjane Hurter, MD       dextrose 5 % in lactated ringers infusion   Intravenous Continuous Freddi Starr, MD 50 mL/hr at 02/19/22 1202 New Bag at 02/19/22 1202   docusate (COLACE) 50 MG/5ML liquid 100 mg  100 mg Per Tube BID Maryjane Hurter, MD   100 mg at 02/19/22 1032   enoxaparin (LOVENOX) injection 40 mg  40 mg Subcutaneous Q24H Maryjane Hurter, MD   40 mg at 02/19/22 0810   fentaNYL (SUBLIMAZE) bolus via infusion 50 mcg  50 mcg Intravenous Q30 min PRN Laqueta Jean, MD       fentaNYL (SUBLIMAZE) injection 50 mcg  50 mcg Intravenous Q15 min PRN Rancour, Annie Main, MD   50 mcg at 02/18/22 2359   fentaNYL 2558mcg in NS 274mL (46mcg/ml) infusion-PREMIX  0-400 mcg/hr Intravenous Continuous Dela Newman Nickels, MD 20 mL/hr at 02/19/22 1210 200 mcg/hr at 02/19/22 1210   midazolam (VERSED) injection 2 mg  2 mg Intravenous Q2H PRN Rancour,  Annie Main, MD   2 mg at 02/19/22 1050   mupirocin ointment (BACTROBAN) 2 % 1 Application  1 Application Nasal BID Freddi Starr, MD   1 Application at 62/37/62 1034   Oral care mouth rinse  15 mL Mouth Rinse Q2H Maryjane Hurter, MD   15 mL at 02/19/22 1204   Oral care mouth rinse  15 mL Mouth Rinse PRN Maryjane Hurter, MD       pantoprazole (PROTONIX) 2 mg/mL oral suspension 40 mg  40 mg Per Tube Daily Maryjane Hurter, MD   40 mg at  02/19/22 1032   polyethylene glycol (MIRALAX / GLYCOLAX) packet 17 g  17 g Per Tube Daily Maryjane Hurter, MD   17 g at 02/19/22 1032   propofol (DIPRIVAN) 1000 MG/100ML infusion  0-50 mcg/kg/min Intravenous Continuous Maryjane Hurter, MD 14.16 mL/hr at 02/19/22 1200 40 mcg/kg/min at 02/19/22 1200   QUEtiapine (SEROQUEL) tablet 25 mg  25 mg Per Tube BID Freddi Starr, MD   25 mg at 02/19/22 1032   thiamine (VITAMIN B1) injection 100 mg  100 mg Intravenous Daily Maryjane Hurter, MD   100 mg at 02/19/22 1035    Musculoskeletal:  Unable to assess patient intubated   Psychiatric Specialty Exam:  Presentation  General Appearance:  Unable to assess patient intubated  Eye Contact: Unable to assess patient intubated  Speech: Unable to assess patient intubated  Speech Volume: Unable to assess patient intubated Handedness: Unable to assess patient intubated   Mood and Affect  Mood: Unable to assess patient intubated  Affect: Unable to assess patient intubated   Thought Process  Thought Processes: Unable to assess patient intubated Descriptions of Associations:Unable to assess patient intubated Orientation:Unable to assess patient intubated  Thought Content:Unable to assess patient intubated History of Schizophrenia/Schizoaffective disorder:No (None reported)  Duration of Psychotic Symptoms:N/A  Hallucinations:N/A Ideas of Reference:None  Suicidal Thoughts:N/A Homicidal Thoughts:N/A  Sensorium  Memory: Unable to  assess patient intubated Judgment: Unable to assess patient intubated Insight: Unable to assess patient intubated   Executive Functions  Concentration: Unable to assess patient intubated  Attention Span: Unable to assess patient intubated  Recall:  Fund of Knowledge:  Unable to assess patient intubated Language: Unable to assess patient intubated   Psychomotor Activity  Psychomotor ActivityUnable to assess patient intubated  Assets  Assets: Social Support   Sleep  Sleep:Unable to assess patient intubated  Physical Exam: Physical Exam Review of Systems  Psychiatric/Behavioral:         Unable to assess patient intubated  All other systems reviewed and are negative.  Blood pressure 110/64, pulse 78, temperature 98.7 F (37.1 C), temperature source Oral, resp. rate 18, height 5\' 8"  (1.727 m), weight 56.6 kg, SpO2 100 %. Body mass index is 18.97 kg/m.  Treatment Plan Summary: Daily contact with patient to assess and evaluate symptoms and progress in treatment, Medication management, and Plan please reconsult psychiatry when patient's extubated and able to participate with assessment.  Disposition: - Plan please reconsult psychiatry when patient's extubated and able to participate with assessment.  [place], NP 02/19/2022 1:32 PM
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
## 2022-02-19 NOTE — Progress Notes (Addendum)
[place] Progress Note Patient Name: Joseph Nunez DOB: [birth year] MRN: [id number]   Date of Service  02/19/2022  HPI/Events of Note  Notified of fever with Tmax 101.   26/M with toxic encephalopathy, currently intubated and sedated. No signs of infection on admission with no leukocytosis; urinalysis and CXR clear as well.   BP 128/65, HR 71, RR 18, O2 sats 100%. ALT 50 Crea 0.57  eICU Interventions  Continue with ice packs.  Give ibuprofen PRN.  Repeat CXR.  Blood cultures drawn 02/18/22.      Intervention Category Intermediate Interventions: Infection - evaluation and management  Elsie Lincoln 02/19/2022, 7:59 PM  8:58 PM CXR clear.  Will continue to monitor off antibiotics.   12:36 AM Bilateral wrist restraints renewed as per request. Pt remains intubated and sedated.

## 2022-02-19 NOTE — Progress Notes (Signed)
PHARMACY - PHYSICIAN COMMUNICATION CRITICAL VALUE ALERT - BLOOD CULTURE IDENTIFICATION (BCID)  [facility] [name] is an 26 y.o. male who presented to [facility] on 02/18/2022 with a chief complaint of Acute toxic encephalopathy, polysubstance abuse, hypoxic respiratory failure.   Assessment:  1/3 Blood culture bottles with gram positive rods.  No results from BCID report.  Since admission he hasn't had any fever and WBC remains WNL.  CXR: No acute abnormality.  Name of physician (or Provider) Contacted: Georgann Housekeeper NP  Current antibiotics: None  Changes to prescribed antibiotics recommended:  Possible contaminant.  No antibiotics added at this time.    No results found for this or any previous visit.  Gretta Arab PharmD, BCPS Clinical Pharmacist [facility] main pharmacy [phone] 02/19/2022 1:43 PM

## 2022-02-19 NOTE — H&P (Signed)
NAME:  [address] 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 Pt's mother had reported to EMS that he uses bath salts, has been eating spoiled food recently. He was acutely agitated on arrival to ED requiring multiple sedating medications for safety. Ultimately he required intubation.  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 She would like to find him help for his mental illness.   Objective   Blood pressure 119/68, pulse 79, temperature 98.7 F (37.1 C), temperature source Oral, resp. rate 18, height 5\' 8"  (1.727 m), weight 56.6 kg, SpO2 100 %.    Vent Mode: PRVC FiO2 (%):  [40 %-100 %] 40 % Set Rate:  [18 bmp] 18 bmp Vt Set:  [480 mL-540 mL] 540 mL PEEP:  [5 cmH20] 5 cmH20 Plateau Pressure:  [11 cmH20-14 cmH20] 14 cmH20   Intake/Output Summary (Last 24 hours) at 02/19/2022 1114 Last data filed at 02/19/2022 0901 Gross per 24 hour  Intake 1257 ml  Output 200 ml  Net 1057 ml   Filed Weights   02/18/22 2127 02/19/22 0500  Weight: 59 kg 56.6 kg    Examination: General young male, intubated,  dissheveled Eyes: sclera anicteric, PERRL HENT:ETT in place Lungs: clear to auscultation, equal chest rise CV: RRR, no murmur  Abdomen: Soft, non-tender; non-distended, BS present  Extremities: No peripheral edema, warm Skin: Normal turgor and texture; no rash.  Neuro: sedated   Resolved Hospital Problem list    Assessment & Plan:   Acute toxic encephalopathy - thiamine - propofol as below  Acute hypoxic respiratory failure - full vent support - fentanyl and versed prn, propofol for rass -1 - daily SAT/SBT as tolerated  Chronic HCV Discovered during last admission - will need follow up as outpatient  Substance induced mood disorder vs BiPD Polysubstance use - history concerning for schizophrenia based on mother's reported symptoms/behaviors - Will place consult for psychiatry evaluation once patient is extubated - Mother would like patient to be transferred to inpatient facility after medical clearance for further treatment of psychiatric and drug use issues  Best Practice (right click and "Reselect all SmartList Selections" daily)   Diet/type: NPO w/ meds via tube DVT prophylaxis: LMWH GI prophylaxis: N/A Lines: N/A Foley:  Yes, and it is still needed Code Status:  full code Last date of multidisciplinary goals of care discussion [spoke with mother 10/2]  Labs   CBC: Recent Labs  Lab 02/18/22 1930 02/18/22 2300  WBC 7.9 8.7  NEUTROABS 4.7  --   HGB 13.0 14.3  HCT 38.4* 41.7  MCV 92.3 91.0  PLT 251 195    Basic Metabolic Panel: Recent Labs  Lab 02/18/22 2144 02/18/22 2300 02/19/22 0429  NA 137 138  --   K 5.0 3.6  --   CL 102 104  --   CO2 29 27  --   GLUCOSE 83 112*  --   BUN 15 12  --   CREATININE 0.85 0.57*  --   CALCIUM 9.0 8.9  --   MG  --   --  2.1   GFR: Estimated Creatinine Clearance: 113 mL/min (A) (by C-G formula based on SCr of 0.57 mg/dL (L)). Recent Labs  Lab 02/18/22 1930 02/18/22 2144 02/18/22 2300  WBC 7.9  --  8.7   LATICACIDVEN  --  0.9 0.7    Liver Function Tests: Recent Labs  Lab 02/18/22 2144 02/18/22 2300  AST 30 21  ALT 51* 50*  ALKPHOS 73 73  BILITOT 1.0 0.8  PROT 6.9 6.8  ALBUMIN 4.1 4.0   Recent Labs  Lab 02/18/22 1930  LIPASE 27   Recent Labs  Lab 02/19/22 0259 02/19/22 0430  AMMONIA 32 27    ABG    Component Value Date/Time   PHART 7.49 (H) 02/18/2022 2307   PCO2ART 40 02/18/2022 2307   PO2ART 473 (H) 02/18/2022 2307   HCO3 30.5 (H) 02/18/2022 2307   TCO2 26 11/08/2021 0519   ACIDBASEDEF 1.0 11/08/2021 0519   O2SAT 100 02/18/2022 2307     Coagulation Profile: No results for input(s): "INR", "PROTIME" in the last 168 hours.  Cardiac Enzymes: Recent Labs  Lab 02/18/22 1930  CKTOTAL 106    HbA1C: No results found for: "HGBA1C"  CBG: Recent Labs  Lab 02/19/22 0507 02/19/22 0810  GLUCAP 96 87    Review of Systems:   Unable to obtain, intubated  Past Medical History:  He,  has a past medical history of Cocaine abuse ([place]), Heroin abuse ([place]), IVDU (intravenous drug user), MRSA (methicillin resistant staph aureus) culture positive, Substance abuse ([place]), and Tobacco abuse.   Surgical History:   Past Surgical History:  Procedure Laterality Date   IRRIGATION AND DEBRIDEMENT ABSCESS N/A 03/08/2017   Procedure: IRRIGATION AND DEBRIDEMENT LEFT CHEST ABSCESS;  Surgeon: Michael Boston, MD;  Location: [facility];  Service: General;  Laterality: N/A;   TEE WITHOUT CARDIOVERSION N/A 03/08/2017   Procedure: TRANSESOPHAGEAL ECHOCARDIOGRAM (TEE);  Surgeon: Skeet Latch, MD;  Location: [facility];  Service: Cardiovascular;  Laterality: N/A;     Social History:   reports that he has been smoking cigarettes. He has been smoking an average of .25 packs per day. He has never used smokeless tobacco. He reports current alcohol use. He reports current drug use. Drugs: Amphetamines, Fentanyl, IV, Marijuana, MDMA (Ecstacy), Benzodiazepines, "Crack" cocaine, Heroin,  Cocaine, and Methamphetamines.   Family History:  His family history includes Diabetes Mellitus II in his sister; Drug abuse in his mother; Heart disease in his mother.   Allergies Allergies  Allergen Reactions   Fish Allergy Hives    Catfish   Suboxone [Buprenorphine Hcl-Naloxone Hcl] Anaphylaxis     Home Medications  Prior to Admission medications   Medication Sig Start Date End Date Taking? Authorizing Provider  acetaminophen (TYLENOL) 500 MG tablet Take 1,000 mg by mouth every 6 (six) hours as needed for moderate pain.    [provider]  benzocaine (ORAJEL) 10 % mucosal gel Use as directed 1 Application in the mouth or throat daily as needed for mouth pain.    [provider]     Critical care time: 35 minutes    Freda Jackson, MD [facility] Office: [phone]   See Amion for personal pager PCCM on call pager [phone] until 7pm. Please call Elink 7p-7a. [phone]

## 2022-02-20 DIAGNOSIS — E440 Moderate protein-calorie malnutrition: Secondary | ICD-10-CM | POA: Insufficient documentation

## 2022-02-20 DIAGNOSIS — G9340 Encephalopathy, unspecified: Secondary | ICD-10-CM | POA: Diagnosis not present

## 2022-02-20 LAB — GLUCOSE, CAPILLARY
Glucose-Capillary: 101 mg/dL — ABNORMAL HIGH (ref 70–99)
Glucose-Capillary: 107 mg/dL — ABNORMAL HIGH (ref 70–99)
Glucose-Capillary: 62 mg/dL — ABNORMAL LOW (ref 70–99)
Glucose-Capillary: 76 mg/dL (ref 70–99)
Glucose-Capillary: 91 mg/dL (ref 70–99)
Glucose-Capillary: 98 mg/dL (ref 70–99)
Glucose-Capillary: 99 mg/dL (ref 70–99)

## 2022-02-20 LAB — BASIC METABOLIC PANEL
Anion gap: 7 (ref 5–15)
BUN: 9 mg/dL (ref 6–20)
CO2: 24 mmol/L (ref 22–32)
Calcium: 8 mg/dL — ABNORMAL LOW (ref 8.9–10.3)
Chloride: 107 mmol/L (ref 98–111)
Creatinine, Ser: 0.65 mg/dL (ref 0.61–1.24)
GFR, Estimated: >60 mL/min (ref >60)
Glucose, Bld: 119 mg/dL — ABNORMAL HIGH (ref 70–99)
Potassium: 3.8 mmol/L (ref 3.5–5.1)
Sodium: 138 mmol/L (ref 135–145)

## 2022-02-20 LAB — CBC
HCT: 36.9 % — ABNORMAL LOW (ref 39.0–52.0)
Hemoglobin: 12.2 g/dL — ABNORMAL LOW (ref 13.0–17.0)
MCH: 31 pg (ref 26.0–34.0)
MCHC: 33.1 g/dL (ref 30.0–36.0)
MCV: 93.9 fL (ref 80.0–100.0)
Platelets: 176 10*3/uL (ref 150–400)
RBC: 3.93 MIL/uL — ABNORMAL LOW (ref 4.22–5.81)
RDW: 13.2 % (ref 11.5–15.5)
WBC: 6.9 10*3/uL (ref 4.0–10.5)
nRBC: 0 % (ref 0.0–0.2)

## 2022-02-20 MED ORDER — QUETIAPINE FUMARATE 25 MG PO TABS
50.0000 mg | ORAL_TABLET | Freq: Every day | ORAL | Status: DC
  Administered 2022-02-20 – 2022-02-22 (×3): 50 mg via ORAL
  Filled 2022-02-20 (×2): qty 2
  Filled 2022-02-20: qty 1

## 2022-02-20 MED ORDER — DEXTROSE 50 % IV SOLN
12.5000 g | INTRAVENOUS | Status: AC
  Administered 2022-02-20: 12.5 g via INTRAVENOUS

## 2022-02-20 MED ORDER — DEXTROSE 50 % IV SOLN
INTRAVENOUS | Status: AC
  Filled 2022-02-20: qty 50

## 2022-02-20 MED ORDER — ADULT MULTIVITAMIN W/MINERALS CH
1.0000 | ORAL_TABLET | Freq: Every day | ORAL | Status: DC
  Administered 2022-02-21: 1 via ORAL
  Filled 2022-02-20 (×2): qty 1

## 2022-02-20 MED ORDER — QUETIAPINE FUMARATE 50 MG PO TABS: 25.0000 mg | ORAL_TABLET | Freq: Every day | ORAL | Status: DC

## 2022-02-20 MED ORDER — ORAL CARE MOUTH RINSE: 15.0000 mL | OROMUCOSAL | Status: DC | PRN

## 2022-02-20 MED ORDER — DEXMEDETOMIDINE HCL IN NACL 200 MCG/50ML IV SOLN
0.4000 ug/kg/h | INTRAVENOUS | Status: DC
  Administered 2022-02-20: 1.2 ug/kg/h via INTRAVENOUS
  Administered 2022-02-20: 0.4 ug/kg/h via INTRAVENOUS
  Administered 2022-02-20: 1.1 ug/kg/h via INTRAVENOUS
  Filled 2022-02-20: qty 100
  Filled 2022-02-20: qty 50

## 2022-02-20 MED ORDER — ENSURE ENLIVE PO LIQD
237.0000 mL | Freq: Two times a day (BID) | ORAL | Status: DC
  Administered 2022-02-21 – 2022-02-22 (×3): 237 mL via ORAL

## 2022-02-20 NOTE — Procedures (Signed)
Extubation Procedure Note  Patient Details:   Name: Joseph Nunez DOB: 1995-07-30 MRN: [id number]   Airway Documentation:    Vent end date: 02/20/22 Vent end time: 0910   Evaluation  O2 sats: stable throughout Complications: No apparent complications Patient did tolerate procedure well. Bilateral Breath Sounds: Clear   Yes  Martha Clan 02/20/2022, 9:17 AM

## 2022-02-20 NOTE — Consult Note (Signed)
  Patient seen and attempted to assess. He is unable to remain awake to answer questions and or fully participate in psychiatric evaluation. Will attempt to reassess tomorrow. Also spoke with nursing staff who reports Precedex was recently turned off, and that he has been seeing things and talking to things that are not in the room.   This writer was able to touch base with his mother Buena Irish [phone]). She reports that he has been experimenting with bath salts for over a year, when he was involved with his ex-girlfriend. She reports his psychosis has been worsening, and she has tried multiple times to get him help that he needs. She is aware that he needs to voluntarily seek treatment, in order to pursue inpatient drug rehabilitation. She\ continues to ruminate about his behaviors and suspected dx of schizophrenia despite mulitple attempts to make her aware of his ongoing substance use. It is with suggestions that we revisit an actual diagnosis we are able to assess this patient in entirety. Discussed with mom while there maybe a family history of schizophrenia and dissociative identity disorder, we are unable to provide a concrete diagnosis until he has been without substances for a minimum of 30 days. SHe reports that she is with him all the time and he doesn't have time to use, yet she reports she caught him and an african [place] male smoking something brown from a pipe and she called the police. Chart review shows most of his recent admissions within the last 3 years have been related to substance use or 2/t substance use. This admission his urine drug screen was positive for methamphetamine, cocaine, benzodiazepines, and THC.   Will attempt to reassess tomorrow.

## 2022-02-20 NOTE — Progress Notes (Signed)
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 Pt's mother had reported to EMS that he uses bath salts, has been eating spoiled food recently. He was acutely agitated on arrival to ED requiring multiple sedating medications for safety. Ultimately he required intubation.  Pertinent  Medical History  Polysubstance use SIMD vs BiPD Chronic HCV  Significant Hospital Events: Including procedures, antibiotic start and stop dates in addition to other pertinent events   10/1 intubated, admitted  Interim History / Subjective:   No acute events overnight He spike a fever over night  Objective   Blood pressure 98/64, pulse (!) 50, temperature (!) 97.5 F (36.4 C), temperature source Axillary, resp. rate 18, height 5\' 8"  (1.727 m), weight 56.5 kg, SpO2 100 %.    Vent Mode: PRVC FiO2 (%):  [30 %-40 %] 30 % Set Rate:  [18 bmp] 18 bmp Vt Set:  [540 mL] 540 mL PEEP:  [5 cmH20] 5 cmH20 Plateau Pressure:  [14 cmH20-15 cmH20] 15 cmH20   Intake/Output Summary (Last 24 hours) at 02/20/2022 0819 Last data filed at 02/20/2022 0701 Gross per 24 hour  Intake 2387.09 ml  Output 1695 ml  Net 692.09 ml   Filed Weights   02/18/22 2127 02/19/22 0500 02/20/22 0449  Weight: 59 kg 56.6 kg 56.5 kg    Examination: General young male, intubated Eyes: sclera anicteric, PERRL HENT:ETT in place Lungs: clear to auscultation, equal chest rise CV: RRR, no murmur  Abdomen: Soft, non-tender; non-distended, BS present  Extremities: No peripheral edema,  warm Skin: Normal turgor and texture; no rash.  Neuro: sedated, arouses to verbal stimuli   Resolved Hospital Problem list    Assessment & Plan:   Acute toxic encephalopathy - thiamine - sedation provided with fentanyl and propofol, plan to wean for extubation  Acute hypoxic respiratory failure - full vent support - stop propofol and start precedex. Plan to stop fentanyl for possible extubation - PSV tiral this morning  Chronic HCV Discovered during last admission - will need follow up as outpatient  Substance induced mood disorder vs BiPD Polysubstance use - history concerning for schizophrenia based on mother's reported symptoms/behaviors - Psychiatry consulted. Formal evaluation once patient is extubated - Mother would like patient to be transferred to inpatient facility after medical clearance for further treatment of psychiatric and drug use issues - seroquel 25mg  BID started  Hypoglycemia - continue D5LR - PRN D50 pushes for glucose <70  Fever - continue to monitor - f/u blood cultures - will hold off on antibiotics at this time  Best Practice (right click and "Reselect all SmartList Selections" daily)   Diet/type: NPO w/ meds via tube DVT prophylaxis: LMWH GI prophylaxis: N/A Lines: N/A Foley:  Yes, and it is still needed Code Status:  full code Last date of multidisciplinary goals of care discussion [spoke with mother 10/2]  Labs   CBC: Recent Labs  Lab 02/18/22 1930 02/18/22 2300 02/20/22 0318  WBC 7.9 8.7 6.9  NEUTROABS 4.7  --   --   HGB 13.0 14.3 12.2*  HCT 38.4* 41.7 36.9*  MCV 92.3 91.0 93.9  PLT 251 251 485    Basic Metabolic Panel: Recent Labs  Lab 02/18/22 2144 02/18/22 2300 02/19/22 0429  NA 137 138  --   K 5.0 3.6  --   CL 102 104  --   CO2 29 27  --   GLUCOSE 83 112*  --   BUN 15 12  --   CREATININE 0.85 0.57*  --   CALCIUM 9.0 8.9  --   MG  --   --  2.1   GFR: Estimated Creatinine Clearance: 112.8 mL/min (A) (by C-G  formula based on SCr of 0.57 mg/dL (L)). Recent Labs  Lab 02/18/22 1930 02/18/22 2144 02/18/22 2300 02/20/22 0318  WBC 7.9  --  8.7 6.9  LATICACIDVEN  --  0.9 0.7  --     Liver Function Tests: Recent Labs  Lab 02/18/22 2144 02/18/22 2300  AST 30 21  ALT 51* 50*  ALKPHOS 73 73  BILITOT 1.0 0.8  PROT 6.9 6.8  ALBUMIN 4.1 4.0   Recent Labs  Lab 02/18/22 1930  LIPASE 27   Recent Labs  Lab 02/19/22 0259 02/19/22 0430  AMMONIA 32 27    ABG    Component Value Date/Time   PHART 7.49 (H) 02/18/2022 2307   PCO2ART 40 02/18/2022 2307   PO2ART 473 (H) 02/18/2022 2307   HCO3 30.5 (H) 02/18/2022 2307   TCO2 26 11/08/2021 0519   ACIDBASEDEF 1.0 11/08/2021 0519   O2SAT 100 02/18/2022 2307     Coagulation Profile: No results for input(s): "INR", "PROTIME" in the last 168 hours.  Cardiac Enzymes: Recent Labs  Lab 02/18/22 1930  CKTOTAL 106    HbA1C: No results found for: "HGBA1C"  CBG: Recent Labs  Lab 02/19/22 1545 02/19/22 1921 02/19/22 2339 02/20/22 0407 02/20/22 0759  GLUCAP 97 75 87 91 62*    Review of Systems:   Unable to obtain, intubated  Past Medical History:  He,  has a past medical history of Cocaine abuse ([place]), Heroin abuse ([place]), IVDU (intravenous drug user), MRSA (methicillin resistant staph aureus) culture positive, Substance abuse ([place]), and Tobacco abuse.   Surgical History:   Past Surgical History:  Procedure Laterality Date   IRRIGATION AND DEBRIDEMENT ABSCESS N/A 03/08/2017   Procedure: IRRIGATION AND DEBRIDEMENT LEFT CHEST ABSCESS;  Surgeon: Michael Boston, MD;  Location[facility];  Service: General;  Laterality: N/A;   TEE WITHOUT CARDIOVERSION N/A 03/08/2017   Procedure: TRANSESOPHAGEAL ECHOCARDIOGRAM (TEE);  Surgeon: Skeet Latch, MD;  Location: [facility];  Service: Cardiovascular;  Laterality: N/A;     Social History:   reports that he has been smoking cigarettes. He has been smoking an average of .25 packs per day.  He has never used smokeless tobacco. He reports current alcohol use. He reports current drug use. Drugs: Amphetamines, Fentanyl, IV, Marijuana, MDMA (Ecstacy), Benzodiazepines, "Crack" cocaine, Heroin, Cocaine, and Methamphetamines.   Family History:  His family history includes Diabetes Mellitus II in his sister; Drug abuse in his mother; Heart disease in his mother.   Allergies Allergies  Allergen Reactions   Fish Allergy Hives    Catfish   Suboxone [Buprenorphine Hcl-Naloxone Hcl] Anaphylaxis     Home Medications  Prior to Admission medications   Medication Sig Start Date End Date Taking? Authorizing Provider  acetaminophen (TYLENOL) 500 MG tablet Take 1,000 mg by mouth every 6 (six) hours as needed for moderate pain.    [provider]  benzocaine (ORAJEL) 10 % mucosal gel  Use as directed 1 Application in the mouth or throat daily as needed for mouth pain.    [provider]     Critical care time: 32 minutes    Freda Jackson, MD [place] Office: [phone]   See Amion for personal pager PCCM on call pager [phone] until 7pm. Please call Elink 7p-7a. [phone]

## 2022-02-20 NOTE — Progress Notes (Signed)
Hypoglycemic Event  CBG: 62  Treatment: D 50 12.5 grams  Symptoms: None  Follow-up CBG: [id number] CBG Result:98  Possible Reasons for Event: NPO due to intubation  Comments/MD notified:Dr, Dewald notified via secure chat.     Britt Bottom

## 2022-02-20 NOTE — Progress Notes (Signed)
Nutrition Follow-up  DOCUMENTATION CODES:   Non-severe (moderate) malnutrition in context of chronic illness  INTERVENTION:   -Ensure Plus High Protein po BID, each supplement provides 350 kcal and 20 grams of protein.   -Multivitamin with minerals daily  NUTRITION DIAGNOSIS:   Moderate Malnutrition related to chronic illness (polysubstance abuse) as evidenced by moderate fat depletion, moderate muscle depletion.  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 Pt's mother had reported to EMS that he uses bath salts, has been eating spoiled food recently. He was acutely agitated on arrival to ED requiring multiple sedating medications for safety. Ultimately he required intubation.  Patient now extubated as of this morning. Has been started on a regular diet.  Will add Ensure supplements and daily MVI.  Admission weight: 130 lbs Current weight: 124 lbs  Medications: Colace, Miralax, Thiamine, D5 infusion  Labs reviewed: CBGs: 62-101  Diet Order:   Diet Order             Diet regular Room service appropriate? Yes; Fluid consistency: Thin  Diet effective now                   EDUCATION NEEDS:   Not appropriate for education at this time  Skin:  Skin Assessment: Reviewed RN Assessment  Last BM:  PTA  Height:   Ht Readings from Last 1 Encounters:  02/18/22 5\' 8"  (1.727 m)    Weight:   Wt Readings from Last 1 Encounters:  02/20/22 56.5 kg    BMI:  Body mass index is 18.94 kg/m.  Estimated Nutritional Needs:   Kcal:  1850-2050  Protein:  85-95g  Fluid:   2L/day  Joseph Bibles, MS, RD, LDN Inpatient Clinical Dietitian Contact information available via Amion

## 2022-02-21 DIAGNOSIS — G9340 Encephalopathy, unspecified: Secondary | ICD-10-CM | POA: Diagnosis not present

## 2022-02-21 LAB — CBC
HCT: 43.3 % (ref 39.0–52.0)
Hemoglobin: 14.3 g/dL (ref 13.0–17.0)
MCH: 30.7 pg (ref 26.0–34.0)
MCHC: 33 g/dL (ref 30.0–36.0)
MCV: 92.9 fL (ref 80.0–100.0)
Platelets: 204 10*3/uL (ref 150–400)
RBC: 4.66 MIL/uL (ref 4.22–5.81)
RDW: 12.9 % (ref 11.5–15.5)
WBC: 8.9 10*3/uL (ref 4.0–10.5)
nRBC: 0 % (ref 0.0–0.2)

## 2022-02-21 LAB — GLUCOSE, CAPILLARY
Glucose-Capillary: 101 mg/dL — ABNORMAL HIGH (ref 70–99)
Glucose-Capillary: 95 mg/dL (ref 70–99)

## 2022-02-21 LAB — BASIC METABOLIC PANEL
Anion gap: 7 (ref 5–15)
BUN: 10 mg/dL (ref 6–20)
CO2: 23 mmol/L (ref 22–32)
Calcium: 8.3 mg/dL — ABNORMAL LOW (ref 8.9–10.3)
Chloride: 110 mmol/L (ref 98–111)
Creatinine, Ser: 0.61 mg/dL (ref 0.61–1.24)
GFR, Estimated: >60 mL/min (ref >60)
Glucose, Bld: 99 mg/dL (ref 70–99)
Potassium: 3.7 mmol/L (ref 3.5–5.1)
Sodium: 140 mmol/L (ref 135–145)

## 2022-02-21 LAB — CULTURE, BLOOD (ROUTINE X 2)

## 2022-02-21 MED ORDER — PANTOPRAZOLE SODIUM 20 MG PO TBEC
40.0000 mg | DELAYED_RELEASE_TABLET | Freq: Every day | ORAL | Status: DC
  Administered 2022-02-21: 40 mg via ORAL
  Filled 2022-02-21 (×2): qty 2

## 2022-02-21 MED ORDER — SODIUM CHLORIDE 0.9 % IV SOLN
2.0000 g | INTRAVENOUS | Status: DC
  Administered 2022-02-21: 2 g via INTRAVENOUS
  Filled 2022-02-21: qty 20

## 2022-02-21 MED ORDER — IBUPROFEN 200 MG PO TABS
400.0000 mg | ORAL_TABLET | Freq: Four times a day (QID) | ORAL | Status: DC | PRN
  Administered 2022-02-22 (×2): 400 mg via ORAL
  Filled 2022-02-21 (×2): qty 2

## 2022-02-21 MED ORDER — VANCOMYCIN HCL 1250 MG/250ML IV SOLN
1250.0000 mg | Freq: Once | INTRAVENOUS | Status: AC
  Administered 2022-02-21: 1250 mg via INTRAVENOUS
  Filled 2022-02-21: qty 250

## 2022-02-21 MED ORDER — AMOXICILLIN-POT CLAVULANATE 875-125 MG PO TABS
1.0000 | ORAL_TABLET | Freq: Two times a day (BID) | ORAL | Status: DC
  Administered 2022-02-21 – 2022-02-22 (×3): 1 via ORAL
  Filled 2022-02-21 (×3): qty 1

## 2022-02-21 MED ORDER — POLYETHYLENE GLYCOL 3350 17 G PO PACK: 17.0000 g | PACK | Freq: Every day | ORAL | Status: DC

## 2022-02-21 MED ORDER — QUETIAPINE FUMARATE 25 MG PO TABS
25.0000 mg | ORAL_TABLET | Freq: Every day | ORAL | Status: DC
  Administered 2022-02-21 – 2022-02-22 (×2): 25 mg via ORAL
  Filled 2022-02-21 (×2): qty 1

## 2022-02-21 MED ORDER — KETOROLAC TROMETHAMINE 15 MG/ML IJ SOLN
15.0000 mg | Freq: Three times a day (TID) | INTRAMUSCULAR | Status: DC | PRN
  Administered 2022-02-21: 15 mg via INTRAVENOUS
  Filled 2022-02-21: qty 1

## 2022-02-21 MED ORDER — GABAPENTIN 300 MG PO CAPS
300.0000 mg | ORAL_CAPSULE | Freq: Three times a day (TID) | ORAL | Status: DC
  Administered 2022-02-21 – 2022-02-22 (×5): 300 mg via ORAL
  Filled 2022-02-21 (×6): qty 1

## 2022-02-21 MED ORDER — DOCUSATE SODIUM 100 MG PO CAPS
100.0000 mg | ORAL_CAPSULE | Freq: Two times a day (BID) | ORAL | Status: DC
  Administered 2022-02-21 – 2022-02-22 (×2): 100 mg via ORAL
  Filled 2022-02-21: qty 2
  Filled 2022-02-21 (×2): qty 1

## 2022-02-21 NOTE — Progress Notes (Signed)
NAME:  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 Pt's mother had reported to EMS that he uses bath salts, has been eating spoiled food recently. He was acutely agitated on arrival to ED requiring multiple sedating medications for safety. Ultimately he required intubation.  Pertinent  Medical History  Polysubstance use SIMD vs BiPD Chronic HCV  Significant Hospital Events: Including procedures, antibiotic start and stop dates in addition to other pertinent events   10/1 intubated, admitted 10/3 extubated  10/4 Has a HA, adding toradol   Interim History / Subjective:   Not on dex overnight  Ambulating back to bed from bathroom  Wants to have a bath   Objective   Blood pressure 115/73, pulse 84, temperature 98.2 F (36.8 C), temperature source Oral, resp. rate (!) 27, height 5\' 8"  (1.727 m), weight 56.5 kg, SpO2 100 %.        Intake/Output Summary (Last 24 hours) at 02/21/2022 1007 Last data filed at 02/21/2022 1003 Gross per 24 hour  Intake 678.7 ml  Output 1100 ml  Net -421.3 ml   Filed Weights   02/18/22 2127 02/19/22 0500 02/20/22 0449  Weight: 59 kg 56.6 kg 56.5 kg    Examination:  General: young adult M NAD  HENT: NCAT pink mm  Neuro: AAOx3 Lungs: CTAb  CV: regular, s1s2 cap refill < 3 sec  Abdomen: soft thin ndnt  Extremities: no acute joint deformity Skin: scattered scabs, various healing stages   Resolved Hospital Problem list    Assessment & Plan:   Acute toxic encephalopathy  Polysubstance use  disorder Psychosis -- substance induced vs underlying psychiatric condition  P - B vitamin support  - seroquel 25mg  BID  -off dex, will dc  - Mother would like patient to be transferred to inpatient facility after medical clearance for further treatment of psychiatric and drug use issues -- psych has been consulted  -substance cessation counseling  -delirium precautions   Chronic HCV Discovered during last admission P - will need follow up as outpatient  Intermittent fever Possible Bacillus bacteremia  -in 1 Bcx bottle only, but has some risk factors that could put him at risk. P  - start vanc per pharm dosing 10/4, as well as rocephin with e coli risk eating from dumpsters  -follow cx data  -repeat Bcx 10/5  Best Practice (right click and "Reselect all SmartList Selections" daily)   Diet/type: Regular consistency (see orders) DVT prophylaxis: LMWH GI prophylaxis: N/A Lines: N/A Foley:  N/A Code Status:  full code Last date of multidisciplinary goals of care discussion [spoke with mother 10/2] Dispo: if remains stable, likely transfer out of ICU   Labs   CBC: Recent Labs  Lab 02/18/22 1930 02/18/22 2300 02/20/22 0318 02/21/22 0312  WBC 7.9 8.7 6.9 8.9  NEUTROABS 4.7  --   --   --   HGB 13.0 14.3 12.2* 14.3  HCT 38.4* 41.7 36.9* 43.3  MCV 92.3 91.0 93.9 92.9  PLT 251 251 176 242    Basic Metabolic Panel: Recent Labs  Lab 02/18/22 2144 02/18/22 2300 02/19/22  6834 02/20/22 1102 02/21/22 0312  NA 137 138  --  138 140  K 5.0 3.6  --  3.8 3.7  CL 102 104  --  107 110  CO2 29 27  --  24 23  GLUCOSE 83 112*  --  119* 99  BUN 15 12  --  9 10  CREATININE 0.85 0.57*  --  0.65 0.61  CALCIUM 9.0 8.9  --  8.0* 8.3*  MG  --   --  2.1  --   --    GFR: Estimated Creatinine Clearance: 112.8 mL/min (by C-G formula based on SCr of 0.61 mg/dL). Recent Labs  Lab 02/18/22 1930 02/18/22 2144 02/18/22 2300 02/20/22 0318 02/21/22 0312  WBC 7.9  --  8.7 6.9 8.9   LATICACIDVEN  --  0.9 0.7  --   --     Liver Function Tests: Recent Labs  Lab 02/18/22 2144 02/18/22 2300  AST 30 21  ALT 51* 50*  ALKPHOS 73 73  BILITOT 1.0 0.8  PROT 6.9 6.8  ALBUMIN 4.1 4.0   Recent Labs  Lab 02/18/22 1930  LIPASE 27   Recent Labs  Lab 02/19/22 0259 02/19/22 0430  AMMONIA 32 27    ABG    Component Value Date/Time   PHART 7.49 (H) 02/18/2022 2307   PCO2ART 40 02/18/2022 2307   PO2ART 473 (H) 02/18/2022 2307   HCO3 30.5 (H) 02/18/2022 2307   TCO2 26 11/08/2021 0519   ACIDBASEDEF 1.0 11/08/2021 0519   O2SAT 100 02/18/2022 2307     Coagulation Profile: No results for input(s): "INR", "PROTIME" in the last 168 hours.  Cardiac Enzymes: Recent Labs  Lab 02/18/22 1930  CKTOTAL 106    HbA1C: No results found for: "HGBA1C"  CBG: Recent Labs  Lab 02/20/22 1621 02/20/22 1955 02/20/22 2342 02/21/22 0358 02/21/22 0842  GLUCAP 76 107* 99 101* 95   CRITICAL CARE Performed by: Cristal Generous  N/a  [name] MSN, AGACNP-BC [place] for pager 02/21/2022, 10:07 AM

## 2022-02-21 NOTE — Consult Note (Addendum)
[facility] Consult   Reason for Consult: '' Substance abuse, erratic behaviors.'' Referring Physician:  Erin Fulling Patient Identification: Joseph [place] MRN:  [id number] Principal Diagnosis: Acute encephalopathy Diagnosis:  Principal Problem:   Acute encephalopathy Active Problems:   Substance use disorder   Malnutrition of moderate degree   Total Time spent with patient: 1 hour  Subjective:   [facility] Joseph N[place] is a 26 y.o. male patient admitted with aggressive behavior and altered mental status.  Patient is initially calm and cooperative, exhibiting no signs of acute distress this morning. He reports feeling "terrible/bad" however significantly better compared to yesterday. The patient denies the use of cocaine, heroin, crack, bath salts and methamphetamines.  He further denies any recent psychotic symptoms, to include passing out at the bus stop, rolling around, eating grass, eating out of the trash can, and acting erratically.  He even denies recent hospital visits to include recent admission in June 2023 with similar presentation for acute agitation and altered mental status.  Patient continues to minimize substance use throughout this psychiatric evaluation.   He demonstrates no insight into the negative consequences of his substance use. The patient mentions that he used to take Seroquel at night for sleep, and has not been taking his medication recently.   [facility] Joseph [place] is a 26 y.o. male is who has a history of substance-induced psychosis, brief psychotic disorder.  Patient did have a difficult time remaining awake during assessment, and required multiple prompts to awake and repeat questions due to him dozing off.  He does endorse ongoing psychosis to include delusions and paranoia, stating he feels like someone is out to get him.  He is unable to identify who this person is or describe their characteristics, however he is afraid for his  safety even while in the hospital. Alert and oriented x3,  Presents with symptoms of paranoia, irritability, loss of energy, and psychomotor agitation with and without substances.  He does request the curtains be pulled close, covers face with blanket.  At times during the interview he does become mute, however unclear if this is thought blocking as he would not respond to some questions and or doze off. At the conclusion of the evaluation, the patient voiced no other concerns.  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 Pt's mother had reported to EMS that he uses bath salts, has been eating spoiled food recently. He was acutely agitated on arrival to ED requiring multiple sedating medications for safety. Ultimately he required intubation    Spoke with mom Buena Irish)- She reports that he has been experimenting with bath salts for over a year, when he was involved with his ex-girlfriend. She reports his psychosis has been worsening, and she has tried multiple times to get him help that he needs. She is aware that he needs to voluntarily seek treatment, in order to pursue inpatient drug rehabilitation. She\ continues to ruminate about his behaviors and suspected dx of schizophrenia despite mulitple attempts to make her aware of his ongoing substance use. It is with suggestions that we revisit an actual diagnosis we are able to assess this patient in entirety. Discussed with mom while there maybe a family history of schizophrenia and dissociative identity disorder, we are unable to provide  a concrete diagnosis until he has been without substances for a minimum of 30 days. SHe reports that she is with him all the time and he doesn't have time to use, yet she reports  she caught him and an african [place] male smoking something brown from a pipe and she called the police. Chart review shows most of his recent admissions within the last 3 years have been related to substance use or 2/t substance use. This admission his urine drug screen was positive for methamphetamine, cocaine, benzodiazepines, and THC    Past Psychiatric History: as above  Risk to Self:  yes, continues to be acutely psychotic displaying paranoia after substances have metabolized Risk to Others:  denies Prior Inpatient Therapy:  multiple in [place] per patient's report Prior Outpatient Therapy:  non-compliant  Past Medical History: No past medical history on file.  The histories are not reviewed yet. Please review them in the "History" navigator section and refresh this [place]. Family History:  Family History  Problem Relation Age of Onset   Drug abuse Mother    Heart disease Mother    Diabetes Mellitus II Sister     Family Psychiatric  History: pt refused to answer Social History:  Social History   Substance and Sexual Activity  Alcohol Use Yes     Social History   Substance and Sexual Activity  Drug Use Yes   Types: Amphetamines, Fentanyl, IV, Marijuana, MDMA (Ecstacy), Benzodiazepines, "Crack" cocaine, Heroin, Cocaine, Methamphetamines   Comment: heroin,     Social History   Socioeconomic History   Marital status: Single    Spouse name: Not on file   Number of children: Not on file   Years of education: Not on file   Highest education level: High school graduate  Occupational History   Not on file  Tobacco Use   Smoking status: Every Day    Packs/day: 0.25    Types: Cigarettes   Smokeless tobacco: Never  Vaping Use   Vaping Use: Never used  Substance and Sexual Activity   Alcohol use: Yes   Drug use: Yes    Types: Amphetamines, Fentanyl, IV, Marijuana, MDMA (Ecstacy), Benzodiazepines, "Crack" cocaine, Heroin, Cocaine, Methamphetamines    Comment:  heroin,    Sexual activity: Not Currently  Other Topics Concern   Not on file  Social History Narrative   ** Merged History Encounter **       Social Determinants of Health   Financial Resource Strain: Not on file  Food Insecurity: Food Insecurity Present (11/12/2021)   Hunger Vital Sign    Worried About Running Out of Food in the Last Year: Often true    Ran Out of Food in the Last Year: Often true  Transportation Needs: Not on file  Physical Activity: Not on file  Stress: Not on file  Social Connections: Not on file   Additional Social History:    Allergies:   Allergies  Allergen Reactions   Fish Allergy Hives    Catfish   Suboxone [Buprenorphine Hcl-Naloxone Hcl] Anaphylaxis    Labs:  Results for orders placed or performed during the hospital encounter of 02/18/22 (from the past 48 hour(s))  Glucose, capillary     Status: None   Collection Time: 02/19/22  7:21 PM  Result Value Ref Range   Glucose-Capillary 75 70 - 99 mg/dL    Comment: Glucose reference range applies only to samples taken after fasting for at least 8 hours.   Comment 1 Notify RN  Comment 2 Document in Chart   Glucose, capillary     Status: None   Collection Time: 02/19/22 11:39 PM  Result Value Ref Range   Glucose-Capillary 87 70 - 99 mg/dL    Comment: Glucose reference range applies only to samples taken after fasting for at least 8 hours.   Comment 1 Notify RN    Comment 2 Document in Chart   CBC     Status: Abnormal   Collection Time: 02/20/22  3:18 AM  Result Value Ref Range   WBC 6.9 4.0 - 10.5 K/uL   RBC 3.93 (L) 4.22 - 5.81 MIL/uL   Hemoglobin 12.2 (L) 13.0 - 17.0 g/dL   HCT 36.9 (L) 39.0 - 52.0 %   MCV 93.9 80.0 - 100.0 fL   MCH 31.0 26.0 - 34.0 pg   MCHC 33.1 30.0 - 36.0 g/dL   RDW 13.2 11.5 - 15.5 %   Platelets 176 150 - 400 K/uL   nRBC 0.0 0.0 - 0.2 %    Comment: Performed at [facility], [place] [address], [place], [place] [address]  Glucose, capillary      Status: None   Collection Time: 02/20/22  4:07 AM  Result Value Ref Range   Glucose-Capillary 91 70 - 99 mg/dL    Comment: Glucose reference range applies only to samples taken after fasting for at least 8 hours.   Comment 1 Notify RN    Comment 2 Document in Chart   Glucose, capillary     Status: Abnormal   Collection Time: 02/20/22  7:59 AM  Result Value Ref Range   Glucose-Capillary 62 (L) 70 - 99 mg/dL    Comment: Glucose reference range applies only to samples taken after fasting for at least 8 hours.   Comment 1 Notify RN    Comment 2 Document in Chart   Glucose, capillary     Status: None   Collection Time: 02/20/22  8:55 AM  Result Value Ref Range   Glucose-Capillary 98 70 - 99 mg/dL    Comment: Glucose reference range applies only to samples taken after fasting for at least 8 hours.  Basic metabolic panel     Status: Abnormal   Collection Time: 02/20/22 11:02 AM  Result Value Ref Range   Sodium 138 135 - 145 mmol/L   Potassium 3.8 3.5 - 5.1 mmol/L   Chloride 107 98 - 111 mmol/L   CO2 24 22 - 32 mmol/L   Glucose, Bld 119 (H) 70 - 99 mg/dL    Comment: Glucose reference range applies only to samples taken after fasting for at least 8 hours.   BUN 9 6 - 20 mg/dL   Creatinine, Ser 0.65 0.61 - 1.24 mg/dL   Calcium 8.0 (L) 8.9 - 10.3 mg/dL   GFR, Estimated >60 >60 mL/min    Comment: (NOTE) Calculated using the CKD-EPI Creatinine Equation (2021)    Anion gap 7 5 - 15    Comment: Performed at [facility], [place] [address] [place], [place] [address]  Glucose, capillary     Status: Abnormal   Collection Time: 02/20/22 12:35 PM  Result Value Ref Range   Glucose-Capillary 101 (H) 70 - 99 mg/dL    Comment: Glucose reference range applies only to samples taken after fasting for at least 8 hours.   Comment 1 Notify RN    Comment 2 Document in Chart   Glucose, capillary     Status: None   Collection Time:  02/20/22  4:21 PM  Result Value Ref Range    Glucose-Capillary 76 70 - 99 mg/dL    Comment: Glucose reference range applies only to samples taken after fasting for at least 8 hours.   Comment 1 Notify RN    Comment 2 Document in Chart   Glucose, capillary     Status: Abnormal   Collection Time: 02/20/22  7:55 PM  Result Value Ref Range   Glucose-Capillary 107 (H) 70 - 99 mg/dL    Comment: Glucose reference range applies only to samples taken after fasting for at least 8 hours.  Glucose, capillary     Status: None   Collection Time: 02/20/22 11:42 PM  Result Value Ref Range   Glucose-Capillary 99 70 - 99 mg/dL    Comment: Glucose reference range applies only to samples taken after fasting for at least 8 hours.  Basic metabolic panel     Status: Abnormal   Collection Time: 02/21/22  3:12 AM  Result Value Ref Range   Sodium 140 135 - 145 mmol/L   Potassium 3.7 3.5 - 5.1 mmol/L   Chloride 110 98 - 111 mmol/L   CO2 23 22 - 32 mmol/L   Glucose, Bld 99 70 - 99 mg/dL    Comment: Glucose reference range applies only to samples taken after fasting for at least 8 hours.   BUN 10 6 - 20 mg/dL   Creatinine, Ser 0.61 0.61 - 1.24 mg/dL   Calcium 8.3 (L) 8.9 - 10.3 mg/dL   GFR, Estimated >60 >60 mL/min    Comment: (NOTE) Calculated using the CKD-EPI Creatinine Equation (2021)    Anion gap 7 5 - 15    Comment: Performed at [facility], [place] [address], [place], [place] [address]  CBC     Status: None   Collection Time: 02/21/22  3:12 AM  Result Value Ref Range   WBC 8.9 4.0 - 10.5 K/uL   RBC 4.66 4.22 - 5.81 MIL/uL   Hemoglobin 14.3 13.0 - 17.0 g/dL   HCT 43.3 39.0 - 52.0 %   MCV 92.9 80.0 - 100.0 fL   MCH 30.7 26.0 - 34.0 pg   MCHC 33.0 30.0 - 36.0 g/dL   RDW 12.9 11.5 - 15.5 %   Platelets 204 150 - 400 K/uL   nRBC 0.0 0.0 - 0.2 %    Comment: Performed at [facility], [place] [address] [place], [place] [address]  Glucose, capillary     Status: Abnormal   Collection Time: 02/21/22  3:58 AM   Result Value Ref Range   Glucose-Capillary 101 (H) 70 - 99 mg/dL    Comment: Glucose reference range applies only to samples taken after fasting for at least 8 hours.  Glucose, capillary     Status: None   Collection Time: 02/21/22  8:42 AM  Result Value Ref Range   Glucose-Capillary 95 70 - 99 mg/dL    Comment: Glucose reference range applies only to samples taken after fasting for at least 8 hours.   Comment 1 Notify RN    Comment 2 Document in Chart     Current Facility-Administered Medications  Medication Dose Route Frequency Provider Last Rate Last Admin   amoxicillin-clavulanate (AUGMENTIN) 875-125 MG per tablet 1 tablet  1 tablet Oral Q12H Freddi Starr, MD       Chlorhexidine Gluconate Cloth 2 % PADS 6 each  6 each Topical Q0600 Freddi Starr, MD   6 each at  02/21/22 0638   docusate sodium (COLACE) capsule 100 mg  100 mg Oral BID Minda Ditto, RPH       enoxaparin (LOVENOX) injection 40 mg  40 mg Subcutaneous Q24H Maryjane Hurter, MD   40 mg at 02/21/22 0844   feeding supplement (ENSURE ENLIVE / ENSURE PLUS) liquid 237 mL  237 mL Oral BID BM Freda Jackson B, MD   237 mL at 02/21/22 1509   ibuprofen (ADVIL) tablet 400 mg  400 mg Oral Q6H PRN Minda Ditto, RPH       multivitamin with minerals tablet 1 tablet  1 tablet Oral Daily Freddi Starr, MD   1 tablet at 02/21/22 1002   mupirocin ointment (BACTROBAN) 2 % 1 Application  1 Application Nasal BID Freddi Starr, MD   1 Application at 78/29/56 1002   Oral care mouth rinse  15 mL Mouth Rinse PRN Freddi Starr, MD       pantoprazole (PROTONIX) EC tablet 40 mg  40 mg Oral Daily Minda Ditto, RPH   40 mg at 02/21/22 1002   [START ON 02/22/2022] polyethylene glycol (MIRALAX / GLYCOLAX) packet 17 g  17 g Oral Daily Green, Terri L, RPH       QUEtiapine (SEROQUEL) tablet 25 mg  25 mg Oral Daily Green, Terri L, RPH   25 mg at 02/21/22 1001   QUEtiapine (SEROQUEL) tablet 50 mg  50 mg Oral QHS Freddi Starr, MD   50 mg at 02/20/22 2147   thiamine (VITAMIN B1) injection 100 mg  100 mg Intravenous Daily Maryjane Hurter, MD   100 mg at 02/21/22 1002    Musculoskeletal: Strength & Muscle Tone: within normal limits Gait & Station: unsteady Patient leans: Front    Psychiatric Specialty Exam:  Presentation  General Appearance: Appropriate for Environment  Eye Contact:Fair (initially started as fair, progressed to minimal)  Speech:Clear and Coherent; Slow  Speech Volume:Normal  Handedness:Right   Mood and Affect  Mood:Depressed; Hopeless  Affect:Congruent; Constricted; Other (comment) (withdrawn)   Thought Process  Thought Processes:Linear  Descriptions of Associations:Intact  Orientation:Full (Time, Place and Person)  Thought Content:Logical  History of Schizophrenia/Schizoaffective disorder:No (None reported) Duration of Psychotic Symptoms:N/A Hallucinations:Hallucinations: Visual  Ideas of Reference:Paranoia; Delusions (people following me and chasing me. Feel  like people are out to get me)  Suicidal Thoughts:Suicidal Thoughts: No  Homicidal Thoughts:Homicidal Thoughts: No   Sensorium  Memory:Immediate Poor; Recent Poor; Remote Fair  Judgment:Impaired  Insight:Lacking   Executive Functions  Concentration:Poor  Attention Span:Poor  Recall:Poor  Fund of Knowledge:Poor  Language:Fair   Psychomotor Activity  Psychomotor Activity:Psychomotor Activity: Decreased   Assets  Assets:Communication Skills; Desire for Improvement; Financial Resources/Insurance; Housing; Physical Health; Social Support   Sleep  Sleep:Sleep: Poor   Physical Exam: Physical Exam Vitals and nursing note reviewed.  Constitutional:      General: He is sleeping.     Appearance: He is normal weight.  Neurological:     General: No focal deficit present.     Mental Status: He is oriented to person, place, and time and easily aroused. Mental status is at  baseline.  Psychiatric:        Attention and Perception: Attention and perception normal.        Mood and Affect: Mood and affect normal.        Speech: Speech is delayed.        Behavior: Behavior normal. Behavior is cooperative.  Thought Content: Thought content normal.        Cognition and Memory: Cognition and memory normal.        Judgment: Judgment is impulsive.    Review of Systems  Psychiatric/Behavioral:  Positive for hallucinations and substance abuse. Negative for depression, memory loss and suicidal ideas. The patient is nervous/anxious and has insomnia.   All other systems reviewed and are negative.  Blood pressure (!) 95/51, pulse 75, temperature (!) 97.4 F (36.3 C), temperature source Oral, resp. rate (!) 32, height 5\' 8"  (1.727 m), weight 56.5 kg, SpO2 99 %. Body mass index is 18.94 kg/m.   [facility] Joseph [place]  is a 26 y.o. male   with a history detailed above initially seen on 02/21/22 for chief complaint of polysubstance use. He was admitted to ICU after receiving ketamine, multiple psychotropic medications in the setting of polysubstance abuse and physical aggression/combativeness.  On assessment today, patient states that he feels bad and denies any illicit substance use.  He continues to minimize his ongoing struggle with addiction and multiple substances, as well as multiple hospitalizations and emergency room visits for substance abuse related conditions.  He continues to endorse paranoia and delusions, and has been free of substance for the past 3 days. Per nursing staff he remains withdrawn, depressed, sudden burst of agitation, and impulsiveness. He does endorse willingness to be admitted to inpatient psychiatric hospitalization for further evaluation, medication management, and crisis stabilization. Patient adamantly denies SI, HI, and AVH. He does meet criteria for acute psychiatric hospitalization and will be transferred once medically stable.    Plan/Recommendations: Continue home medications that include Seroquel 25 mg p.o. every morning and Seroquel 50 mg p.o. nightly. -Will start gabapentin 300 mg TID for agitation/anxiety/polysubstance induced mood disorder -Continue thiamine 100 IV daily. -Continue to closely monitor for withdrawal of substances.  Patient has not displayed any acute agitation since admission to the emergency department.  Nor has he received any as needed medications since admitted to the hospital.  In the event patient becomes acutely agitated consider agitation protocol of Haldol 5 mg p.o./IM every 8 hours as needed and Ativan 1 mg p.o./IM every 8 hours as needed. Labs reviewed and determined to be within normal limits to include CK, troponin, CMP, lactic acid, CBC with differential, and urinalysis.  Urine drug screen positive for amphetamines, benzodiazepines, cocaine, THC (patient did receive benzodiazepines during emergency room visit).  -QTc 370; obtained on October 1.  Disposition: Recommend psychiatric Inpatient admission when medically cleared. *After completion of inpatient psychiatric admission, will further benefit from inpatient rehab. *In the event patient attempts to leave, will need to be placed under involuntary commitment for history of substance abuse, ongoing psychosis, high risk for suicide completion.  Suella Broad, FNP 02/21/2022 4:09 PM

## 2022-02-22 ENCOUNTER — Inpatient Hospital Stay (HOSPITAL_COMMUNITY): Payer: Commercial Managed Care - HMO

## 2022-02-22 DIAGNOSIS — E440 Moderate protein-calorie malnutrition: Secondary | ICD-10-CM | POA: Diagnosis not present

## 2022-02-22 DIAGNOSIS — G9340 Encephalopathy, unspecified: Secondary | ICD-10-CM | POA: Diagnosis not present

## 2022-02-22 DIAGNOSIS — F1990 Other psychoactive substance use, unspecified, uncomplicated: Secondary | ICD-10-CM | POA: Diagnosis not present

## 2022-02-22 LAB — COMPREHENSIVE METABOLIC PANEL
ALT: 40 U/L (ref 0–44)
AST: 20 U/L (ref 15–41)
Albumin: 3.5 g/dL (ref 3.5–5.0)
Alkaline Phosphatase: 57 U/L (ref 38–126)
Anion gap: 7 (ref 5–15)
BUN: 12 mg/dL (ref 6–20)
CO2: 24 mmol/L (ref 22–32)
Calcium: 8.7 mg/dL — ABNORMAL LOW (ref 8.9–10.3)
Chloride: 108 mmol/L (ref 98–111)
Creatinine, Ser: 0.58 mg/dL — ABNORMAL LOW (ref 0.61–1.24)
GFR, Estimated: >60 mL/min (ref >60)
Glucose, Bld: 103 mg/dL — ABNORMAL HIGH (ref 70–99)
Potassium: 4 mmol/L (ref 3.5–5.1)
Sodium: 139 mmol/L (ref 135–145)
Total Bilirubin: 0.5 mg/dL (ref 0.3–1.2)
Total Protein: 6.7 g/dL (ref 6.5–8.1)

## 2022-02-22 LAB — SARS CORONAVIRUS 2 BY RT PCR: SARS Coronavirus 2 by RT PCR: NEGATIVE

## 2022-02-22 LAB — CBC WITH DIFFERENTIAL/PLATELET
Abs Immature Granulocytes: 0.2 10*3/uL — ABNORMAL HIGH (ref 0.00–0.07)
Basophils Absolute: 0.1 10*3/uL (ref 0.0–0.1)
Basophils Relative: 1 %
Eosinophils Absolute: 0.1 10*3/uL (ref 0.0–0.5)
Eosinophils Relative: 1 %
HCT: 44 % (ref 39.0–52.0)
Hemoglobin: 14.5 g/dL (ref 13.0–17.0)
Immature Granulocytes: 0 %
Lymphocytes Relative: 15 %
Lymphs Abs: 1.7 10*3/uL (ref 0.7–4.0)
MCH: 30.2 pg (ref 26.0–34.0)
MCHC: 33 g/dL (ref 30.0–36.0)
MCV: 91.7 fL (ref 80.0–100.0)
Monocytes Absolute: 0.7 10*3/uL (ref 0.1–1.0)
Monocytes Relative: 6 %
Neutro Abs: 9.1 10*3/uL — ABNORMAL HIGH (ref 1.7–7.7)
Neutrophils Relative %: 77 %
Platelets: 239 10*3/uL (ref 150–400)
RBC: 4.8 MIL/uL (ref 4.22–5.81)
RDW: 12.7 % (ref 11.5–15.5)
WBC: 11.7 10*3/uL — ABNORMAL HIGH (ref 4.0–10.5)
nRBC: 0 % (ref 0.0–0.2)
nRBC: 0 /100 WBC

## 2022-02-22 LAB — PHOSPHORUS: Phosphorus: 3.5 mg/dL (ref 2.5–4.6)

## 2022-02-22 LAB — MAGNESIUM: Magnesium: 2.1 mg/dL (ref 1.7–2.4)

## 2022-02-22 MED ORDER — GUAIFENESIN ER 600 MG PO TB12: 1200.0000 mg | ORAL_TABLET | Freq: Two times a day (BID) | ORAL | 0 refills | Status: AC

## 2022-02-22 MED ORDER — QUETIAPINE FUMARATE 50 MG PO TABS: 50.0000 mg | ORAL_TABLET | Freq: Every day | ORAL | 0 refills | Status: DC

## 2022-02-22 MED ORDER — HALOPERIDOL 5 MG PO TABS
5.0000 mg | ORAL_TABLET | Freq: Three times a day (TID) | ORAL | Status: DC | PRN
  Filled 2022-02-22: qty 1

## 2022-02-22 MED ORDER — GABAPENTIN 300 MG PO CAPS: 300.0000 mg | ORAL_CAPSULE | Freq: Three times a day (TID) | ORAL | 0 refills | Status: DC

## 2022-02-22 MED ORDER — TRAMADOL HCL 50 MG PO TABS
50.0000 mg | ORAL_TABLET | Freq: Four times a day (QID) | ORAL | Status: DC | PRN
  Administered 2022-02-22: 50 mg via ORAL
  Filled 2022-02-22: qty 1

## 2022-02-22 MED ORDER — GUAIFENESIN ER 600 MG PO TB12
1200.0000 mg | ORAL_TABLET | Freq: Two times a day (BID) | ORAL | Status: DC
  Administered 2022-02-22: 1200 mg via ORAL
  Filled 2022-02-22: qty 2

## 2022-02-22 MED ORDER — THIAMINE MONONITRATE 100 MG PO TABS
100.0000 mg | ORAL_TABLET | Freq: Every day | ORAL | Status: DC
  Filled 2022-02-22: qty 1

## 2022-02-22 MED ORDER — LORAZEPAM 2 MG/ML IJ SOLN: 1.0000 mg | Freq: Three times a day (TID) | INTRAMUSCULAR | Status: DC | PRN

## 2022-02-22 MED ORDER — HALOPERIDOL LACTATE 5 MG/ML IJ SOLN: 5.0000 mg | Freq: Three times a day (TID) | INTRAMUSCULAR | Status: DC | PRN

## 2022-02-22 MED ORDER — AMOXICILLIN-POT CLAVULANATE 875-125 MG PO TABS: 1.0000 | ORAL_TABLET | Freq: Two times a day (BID) | ORAL | 0 refills | Status: AC

## 2022-02-22 MED ORDER — ENSURE ENLIVE PO LIQD: 237.0000 mL | Freq: Two times a day (BID) | ORAL | 12 refills | Status: DC

## 2022-02-22 MED ORDER — POLYETHYLENE GLYCOL 3350 17 G PO PACK: 17.0000 g | PACK | Freq: Every day | ORAL | 0 refills | Status: DC

## 2022-02-22 MED ORDER — QUETIAPINE FUMARATE 25 MG PO TABS: 25.0000 mg | ORAL_TABLET | Freq: Every day | ORAL | 0 refills | Status: DC

## 2022-02-22 MED ORDER — VITAMIN B-1 100 MG PO TABS: 100.0000 mg | ORAL_TABLET | Freq: Every day | ORAL | 0 refills | Status: DC

## 2022-02-22 MED ORDER — ADULT MULTIVITAMIN W/MINERALS CH: 1.0000 | ORAL_TABLET | Freq: Every day | ORAL | 0 refills | Status: DC

## 2022-02-22 MED ORDER — HALOPERIDOL LACTATE 5 MG/ML IJ SOLN
5.0000 mg | Freq: Three times a day (TID) | INTRAMUSCULAR | Status: DC | PRN
  Administered 2022-02-22 – 2022-02-23 (×2): 5 mg via INTRAMUSCULAR
  Filled 2022-02-22 (×2): qty 1

## 2022-02-22 MED ORDER — LORAZEPAM 1 MG PO TABS
1.0000 mg | ORAL_TABLET | Freq: Three times a day (TID) | ORAL | Status: DC | PRN
  Administered 2022-02-22 – 2022-02-23 (×3): 1 mg via ORAL
  Filled 2022-02-22 (×3): qty 1

## 2022-02-22 MED ORDER — PANTOPRAZOLE SODIUM 40 MG PO TBEC: 40.0000 mg | DELAYED_RELEASE_TABLET | Freq: Every day | ORAL | 0 refills | Status: DC

## 2022-02-22 MED ORDER — DOCUSATE SODIUM 100 MG PO CAPS: 100.0000 mg | ORAL_CAPSULE | Freq: Two times a day (BID) | ORAL | 0 refills | Status: DC
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
## 2022-02-22 NOTE — TOC Transition Note (Signed)
Transition of Care [facility]) - CM/SW Discharge Note   Patient Details  Name: Joseph Nunez MRN: [id number] Date of Birth: [birth year]  Transition [facility] CM/SW Contact:  Dessa Phi, RN Phone Number: 02/22/2022, 3:53 PM   Clinical Narrative:  Pt was accept to [facility] 02/22/22;  Main campus   Pt meets inpatient criteria per Suella Broad, FNP  Attending Physician will be Dr. Jonelle Sports   Report can be called to: [phone]  Pt can arrive after: BED IS READY  Care Team notified: Suella Broad, FNP, and Dessa Phi, RN   Once stable for d/c-Once report given-Nsg TC GPD(out of [facility])[phone] for transport for IVC.     Final next level of care: Psychiatric Hospital Barriers to Discharge: No Barriers Identified   Patient Goals and CMS Choice   CMS Medicare.gov Compare Post Acute Care list provided to:: Other (Comment Required) (IVC) Choice offered to / list presented to :  (IVC)  Discharge Placement                       Discharge Plan and Services   Discharge Planning Services: CM Consult Post Acute Care Choice:  (IP Psych)                               Social Determinants of Health (SDOH) Interventions     Readmission Risk Interventions     No data to display

## 2022-02-22 NOTE — Progress Notes (Addendum)
Per Suella Broad, FNP pt meets inpatient criteria and request that pt be faxed to out of network providers:  Destination Service Provider Address Phone Fax  [facility]  [place], [place] [address] [place]  [facility]  [address] [place] [address] [phone] [place]  [place], [place] [address] [phone]  [facility]  [address] [place] [address] [place]  [facility]  [address] [place] [address] [phone]  [facility]  [address], [place] [address] [phone]  [place] [address], [place] [address] [phone]  [facility]  [address], [place] [address] [phone] [place]  [address] [place] [address] [phone] [facility]  [address] [place] [address] [phone] [facility]  [address] [place] [address] [place]  [address] [place] [address] [phone] [facility]  [address] [place] [address] [phone]  [facility]  [address] [place] [address] [phone] [place], LCSWA 02/22/2022 @ 3:45 PM

## 2022-02-22 NOTE — TOC Initial Note (Addendum)
Transition of Care [facility]) - Initial/Assessment Note    Patient Details  Name: [facility] [name] MRN: [id number] Date of Birth: 1996/01/09  [facility] CM/SW Contact:    Dessa Phi, RN Phone Number: 02/22/2022, 1:00 PM  Clinical Narrative:Awaiting response from [place] on IVC-in process.Referral sent to [facility] await assessment if can accept.   -1:36-Called Sheriff for IVC to serve patient-await sheriff to complete seving patient-forms in IVC orange folder @ [facility] station.    -2:25p-      IVC-IP Psych-  Sheriff has served IVC.[place] to review.Faxed to [facility];[place],await response from [facility] via [phone] No [facility] beds today.  Noted for covid test.   Expected Discharge Plan: Psychiatric Hospital Barriers to Discharge: Continued Medical Work up   Patient Goals and CMS Choice   CMS Medicare.gov Compare Post Acute Care list provided to:: Other (Comment Required) (IVC) Choice offered to / list presented to :  (IVC)  Expected Discharge Plan and Services Expected Discharge Plan: [facility]   Discharge Planning Services: CM Consult Post Acute Care Choice:  (IP Psych) Living arrangements for the past 2 months: Single Family Home                                      Prior Living Arrangements/Services Living arrangements for the past 2 months: Single Family Home Lives with:: Parents Patient language and need for interpreter reviewed:: Yes Do you feel safe going back to the place where you live?: Yes      Need for Family Participation in Patient Care: Yes (Comment) Care giver support system in place?: Yes (comment)   Criminal Activity/Legal Involvement Pertinent to Current Situation/Hospitalization: No - Comment as needed  Activities of Daily Living      Permission Sought/Granted Permission sought to share information with :  Case Manager Permission granted to share information with : Yes, Verbal Permission Granted  Share Information with NAME:  (Case manager)           Emotional Assessment Appearance:: Appears stated age Attitude/Demeanor/Rapport: Sedated Affect (typically observed): Anxious Orientation: : Oriented to Self, Oriented to Place, Oriented to  Time, Oriented to Situation Alcohol / Substance Use: Illicit Drugs, Alcohol Use, Tobacco Use Psych Involvement: Yes (comment)  Admission diagnosis:  Toxic encephalopathy [G92.9] Acute encephalopathy [G93.40] Acute respiratory failure, unspecified whether with hypoxia or hypercapnia (HCC) [J96.00] Patient Active Problem List   Diagnosis Date Noted   Malnutrition of moderate degree 02/20/2022   Bipolar I disorder, current or most recent episode manic, severe ([place]) 11/12/2021   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 ([place]) 11/12/2021   Polysubstance dependence including opioid type drug with complication, continuous use ([place]) 11/12/2021   Overdose 11/08/2021   Agitation    Acute encephalopathy    Acute respiratory failure (HCC)    Fentanyl use disorder, severe ([place]) 03/16/2021   Abscesses of parasternal chest wall s/p I&D 03/08/2017 03/08/2017   Abscess of right chest wall s/p I&D 03/08/2017 03/08/2017   Bacteremia 03/07/2017   Sepsis ([place]) 03/06/2017   Pyomyositis 03/06/2017   Cellulitis of pectoral region 03/06/2017   Heroin abuse ([place])    Substance use disorder    Cocaine abuse ([place])    Tobacco abuse    PCP:  Pcp, No Pharmacy:   [facility] [place], [place] - [phone] [address] AT [place] [address] Phone: [phone] Fax: [phone]  [id number] [place], [place] [address] Phone: [phone] Fax: [phone]     Social Determinants  of Health (SDOH) Interventions    Readmission Risk Interventions     No data to display

## 2022-02-22 NOTE — Progress Notes (Addendum)
Called [phone] and left a callback number of [phone] twice to give report.  [facility] paged once more for query on their policy on whether they need patient's peripheral access to remain in place in transit.

## 2022-02-22 NOTE — Progress Notes (Signed)
Pt was accept to [facility] 02/22/22;  Main campus   Pt meets inpatient criteria per Suella Broad, FNP  Attending Physician will be Dr. Jonelle Sports   Report can be called to: [phone]  Pt can arrive after: [place] Team notified: Suella Broad, FNP, and Dessa Phi, RN   Benjaman Kindler, MSW, [facility] 02/22/2022 3:51 PM

## 2022-02-22 NOTE — Progress Notes (Signed)
Joseph Nunez laying on the bed appeared to be asleep. Batu awake made a loud cough and start to spit in the trash can beside the bed. Kimberley laid back down on the bed. Shahzaib appeared to be asleep.

## 2022-02-22 NOTE — Consult Note (Addendum)
[facility] Consult   Reason for Consult: '' Substance abuse, erratic behaviors.'' Referring Physician:  Erin Fulling Patient Identification: Joseph Nunez MRN:  [id number] Principal Diagnosis: Acute encephalopathy Diagnosis:  Principal Problem:   Acute encephalopathy Active Problems:   Substance use disorder   Malnutrition of moderate degree   Total Time spent with patient: 1 hour  Subjective:   Joseph Nunez is a 26 y.o. male patient admitted with aggressive behavior and altered mental status.  Patient is seen and attempted to assess, unsuccessful at this time as patient recently received IM medication for psychosis, delusions, agitation, and attempting to leave hospital.  Patient appears sedated, however does awaken with the calling of his name.  Patient is able to briefly state he was attempting to leave because he was afraid for his safety, prior to dozing back off.  However he continues to lack insight and judgment, in regards to her psychosis and continues to minimize his current substance use.  He has expressed paranoia x 2 days, remains delusional at this time as noted above feeling like people are out to get him and try to flee from the hospital for his safety.    He also has limited insight in regards to his lack of neglect and self-care, related to his underlying psychiatric condition.  Per chart review patient has been eating out of the trash can, falling around at the bus stop, and eating grass.  Although patient has improve in terms of he is paranoia, he continues to present with ongoing delusions.  He overall has shown much improvement over the course of the 3 day admission that has been complicated by his substance-induced psychosis, mood swings, and is now entering acute withdrawal stage as evidenced by runny nose, pinpoint pupils, nasal congestion, agitation, delusions, irritability.  Patient did receive Haldol 5 mg IM, and had positive  therapeutic response.  Considering factors noted above, this writer did initiate involuntary commitment for 7 days, as patient continues to remain a danger to himself and others at this time.   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 Pt's mother had reported to EMS that he uses bath salts, has been eating spoiled food recently. He was acutely agitated on arrival to ED requiring multiple sedating medications for safety. Ultimately he required intubation     Past Psychiatric History: as above  Risk to Self:  yes, continues to be acutely psychotic displaying paranoia after substances have metabolized Risk to Others:  denies Prior Inpatient Therapy:  multiple in [place] per patient's report Prior Outpatient Therapy:  non-compliant  Past Medical History: No past medical history on file.  The histories are not reviewed yet. Please review them in the "History" navigator section and refresh this [place]. Family History:  Family History  Problem Relation Age of Onset   Drug abuse Mother    Heart disease Mother    Diabetes Mellitus II Sister     Family Psychiatric  History: pt refused to answer Social History:  Social History   Substance and Sexual Activity  Alcohol Use Yes     Social History   Substance and Sexual Activity  Drug Use Yes   Types: Amphetamines, Fentanyl, IV, Marijuana, MDMA (Ecstacy), Benzodiazepines, "Crack" cocaine, Heroin, Cocaine, Methamphetamines   Comment: heroin,  Social History   Socioeconomic History   Marital status: Single    Spouse name: Not on file   Number of children: Not on file   Years of education: Not on file   Highest education level: High school graduate  Occupational History   Not on file   Tobacco Use   Smoking status: Every Day    Packs/day: 0.25    Types: Cigarettes   Smokeless tobacco: Never  Vaping Use   Vaping Use: Never used  Substance and Sexual Activity   Alcohol use: Yes   Drug use: Yes    Types: Amphetamines, Fentanyl, IV, Marijuana, MDMA (Ecstacy), Benzodiazepines, "Crack" cocaine, Heroin, Cocaine, Methamphetamines    Comment: heroin,    Sexual activity: Not Currently  Other Topics Concern   Not on file  Social History Narrative   ** Merged History Encounter **       Social Determinants of Health   Financial Resource Strain: Not on file  Food Insecurity: Food Insecurity Present (11/12/2021)   Hunger Vital Sign    Worried About Running Out of Food in the Last Year: Often true    Ran Out of Food in the Last Year: Often true  Transportation Needs: Not on file  Physical Activity: Not on file  Stress: Not on file  Social Connections: Not on file   Additional Social History:    Allergies:   Allergies  Allergen Reactions   Fish Allergy Hives    Catfish   Suboxone [Buprenorphine Hcl-Naloxone Hcl] Anaphylaxis    Labs:  Results for orders placed or performed during the hospital encounter of 02/18/22 (from the past 48 hour(s))  Glucose, capillary     Status: None   Collection Time: 02/20/22  4:21 PM  Result Value Ref Range   Glucose-Capillary 76 70 - 99 mg/dL    Comment: Glucose reference range applies only to samples taken after fasting for at least 8 hours.   Comment 1 Notify RN    Comment 2 Document in Chart   Glucose, capillary     Status: Abnormal   Collection Time: 02/20/22  7:55 PM  Result Value Ref Range   Glucose-Capillary 107 (H) 70 - 99 mg/dL    Comment: Glucose reference range applies only to samples taken after fasting for at least 8 hours.  Glucose, capillary     Status: None   Collection Time: 02/20/22 11:42 PM  Result Value Ref Range   Glucose-Capillary 99 70 - 99 mg/dL    Comment: Glucose reference range applies only to  samples taken after fasting for at least 8 hours.  Basic metabolic panel     Status: Abnormal   Collection Time: 02/21/22  3:12 AM  Result Value Ref Range   Sodium 140 135 - 145 mmol/L   Potassium 3.7 3.5 - 5.1 mmol/L   Chloride 110 98 - 111 mmol/L   CO2 23 22 - 32 mmol/L   Glucose, Bld 99 70 - 99 mg/dL    Comment: Glucose reference range applies only to samples taken after fasting for at least 8 hours.   BUN 10 6 - 20 mg/dL   Creatinine, Ser 0.61 0.61 - 1.24 mg/dL   Calcium 8.3 (L) 8.9 - 10.3 mg/dL   GFR, Estimated >60 >60 mL/min    Comment: (NOTE) Calculated using the CKD-EPI Creatinine Equation (2021)    Anion gap 7 5 - 15    Comment: Performed at [facility], [place] [address] [place], [place] [address]  CBC     Status: None   Collection Time: 02/21/22  3:12 AM  Result Value Ref Range   WBC 8.9 4.0 - 10.5 K/uL   RBC 4.66 4.22 - 5.81 MIL/uL   Hemoglobin 14.3 13.0 - 17.0 g/dL   HCT 43.3 39.0 - 52.0 %   MCV 92.9 80.0 - 100.0 fL   MCH 30.7 26.0 - 34.0 pg   MCHC 33.0 30.0 - 36.0 g/dL   RDW 12.9 11.5 - 15.5 %   Platelets 204 150 - 400 K/uL   nRBC 0.0 0.0 - 0.2 %    Comment: Performed at [facility], [place] [address] [place], [place] [address]  Glucose, capillary     Status: Abnormal   Collection Time: 02/21/22  3:58 AM  Result Value Ref Range   Glucose-Capillary 101 (H) 70 - 99 mg/dL    Comment: Glucose reference range applies only to samples taken after fasting for at least 8 hours.  Glucose, capillary     Status: None   Collection Time: 02/21/22  8:42 AM  Result Value Ref Range   Glucose-Capillary 95 70 - 99 mg/dL    Comment: Glucose reference range applies only to samples taken after fasting for at least 8 hours.   Comment 1 Notify RN    Comment 2 Document in Chart     Current Facility-Administered Medications  Medication Dose Route Frequency Provider Last Rate Last Admin   amoxicillin-clavulanate (AUGMENTIN) 875-125 MG per tablet  1 tablet  1 tablet Oral Q12H Freddi Starr, MD   1 tablet at 02/22/22 0935   Chlorhexidine Gluconate Cloth 2 % PADS 6 each  6 each Topical Q0600 Freddi Starr, MD   6 each at 02/21/22 [phone]   docusate sodium (COLACE) capsule 100 mg  100 mg Oral BID Freda Jackson B, MD   100 mg at 02/21/22 2144   enoxaparin (LOVENOX) injection 40 mg  40 mg Subcutaneous Q24H Maryjane Hurter, MD   40 mg at 02/22/22 1155   feeding supplement (ENSURE ENLIVE / ENSURE PLUS) liquid 237 mL  237 mL Oral BID BM Freddi Starr, MD   237 mL at 02/22/22 0935   gabapentin (NEURONTIN) capsule 300 mg  300 mg Oral TID Suella Broad, FNP   300 mg at 02/22/22 0904   haloperidol (HALDOL) tablet 5 mg  5 mg Oral Q8H PRN [name], DO       Or   haloperidol lactate (HALDOL) injection 5 mg  5 mg Intramuscular Q8H PRN Raiford Noble [place], DO   5 mg at 02/22/22 0853   ibuprofen (ADVIL) tablet 400 mg  400 mg Oral Q6H PRN Minda Ditto, RPH   400 mg at 02/22/22 0904   LORazepam (ATIVAN) tablet 1 mg  1 mg Oral Q8H PRN Raiford Noble [place], DO   1 mg at 02/22/22 1027   Or   LORazepam (ATIVAN) injection 1 mg  1 mg Intramuscular Q8H PRN [name], DO       multivitamin with minerals tablet 1 tablet  1 tablet Oral Daily Freddi Starr, MD   1 tablet at 02/21/22 1002   mupirocin ointment (BACTROBAN) 2 % 1 Application  1 Application Nasal BID Freddi Starr, MD   1 Application at 25/36/64 2145   Oral care mouth rinse  15 mL Mouth Rinse PRN Freddi Starr, MD       pantoprazole (PROTONIX) EC tablet 40 mg  40 mg  Oral Daily Minda Ditto, RPH   40 mg at 02/21/22 1002   polyethylene glycol (MIRALAX / GLYCOLAX) packet 17 g  17 g Oral Daily Minda Ditto, RPH       QUEtiapine (SEROQUEL) tablet 25 mg  25 mg Oral Daily Minda Ditto, RPH   25 mg at 02/22/22 1572   QUEtiapine (SEROQUEL) tablet 50 mg  50 mg Oral QHS Freddi Starr, MD   50 mg at 02/21/22 2143   thiamine (VITAMIN B1) tablet  100 mg  100 mg Oral Daily Kerney Elbe, DO        Musculoskeletal: Strength & Muscle Tone: within normal limits Gait & Station: unsteady Patient leans: Front    Psychiatric Specialty Exam:  Presentation  General Appearance: Appropriate for Environment  Joseph Contact:Minimal  Speech:Slow  Speech Volume:Decreased  Handedness:Right   Mood and Affect  Mood:Irritable  Affect:Constricted   Thought Process  Thought Processes:Linear  Descriptions of Associations:-- (UTA)  Orientation:-- (UTA)  Thought Content:-- (UTA)  History of Schizophrenia/Schizoaffective disorder:No (None reported) Duration of Psychotic Symptoms:N/A Hallucinations:Hallucinations: -- (UTA)  Ideas of Reference:Delusions; Paranoia  Suicidal Thoughts:Suicidal Thoughts: -- (UTA)  Homicidal Thoughts:Homicidal Thoughts: -- (UTA)   Sensorium  Memory:Immediate Poor; Recent Poor; Remote Fair  Judgment:Impaired  Insight:Lacking   Executive Functions  Concentration:-- (UTA)  Attention Span:-- (UTA)  Recall:-- (UTA)  Fund of Knowledge:-- (UTA)  Language:-- (UTA)   Psychomotor Activity  Psychomotor Activity:Psychomotor Activity: -- (UTA)   Assets  Assets:-- (UTA)   Sleep  Sleep:Sleep: Poor   Physical Exam: Physical Exam Vitals and nursing note reviewed.  Constitutional:      General: He is sleeping.     Comments: Sedated d/t Haldol  Neurological:     General: No focal deficit present.     Mental Status: He is oriented to person, place, and time and easily aroused. Mental status is at baseline.  Psychiatric:        Attention and Perception: Attention and perception normal.        Mood and Affect: Mood and affect normal.        Speech: Speech is delayed.        Behavior: Behavior normal.        Thought Content: Thought content normal.        Cognition and Memory: Cognition and memory normal.        Judgment: Judgment is impulsive.    Review of Systems   Psychiatric/Behavioral:  Positive for substance abuse. Negative for depression, hallucinations, memory loss and suicidal ideas. The patient is nervous/anxious and has insomnia.   All other systems reviewed and are negative.  Blood pressure 128/74, pulse 74, temperature 98.7 F (37.1 C), temperature source Oral, resp. rate (!) 23, height 5\' 8"  (1.727 m), weight 56.6 kg, SpO2 100 %. Body mass index is 18.97 kg/m.   Joseph Nunez  is a 26 y.o. male   with a history detailed above initially seen on 02/22/22 for chief complaint of polysubstance use. He was admitted to ICU after receiving ketamine, multiple psychotropic medications in the setting of polysubstance abuse and physical aggression/combativeness.  On assessment today, patient states that he feels bad and denies any illicit substance use.  He continues to minimize his ongoing struggle with addiction and multiple substances, as well as multiple hospitalizations and emergency room visits for substance abuse related conditions.  He continues to endorse paranoia and delusions, and has been free of substance for the past 3 days.  Per nursing staff he remains withdrawn, depressed, sudden burst of agitation, and impulsiveness. He does endorse willingness to be admitted to inpatient psychiatric hospitalization for further evaluation, medication management, and crisis stabilization. Patient adamantly denies SI, HI, and AVH. He does meet criteria for acute psychiatric hospitalization and will be transferred once medically stable.   Plan/Recommendations: Continue home medications that include Seroquel 25 mg p.o. every morning and Seroquel 50 mg p.o. nightly. -Will continue gabapentin 300 mg TID for agitation/anxiety/polysubstance induced mood disorder -Continue thiamine 100 IV daily. -Continue to closely monitor for withdrawal of substances.  May benefit from clonidine protocol, however urine drug screen negative for opiates and patient not  forthcoming about substance use.   -Labs reviewed and determined to be within normal limits to include CK, troponin, CMP, lactic acid, CBC with differential, and urinalysis.  Urine drug screen positive for amphetamines, benzodiazepines, cocaine, THC (patient did receive benzodiazepines during emergency room visit).  -QTc 370; obtained on October 1. -IVC papers have been completed, notarized, and faxed to the magistrate.  Addendum: Current magistrate requested updates for delusions and psychosis, which was revised and refaxed.  IVC papers have been accepted at this time.   Disposition: Recommend psychiatric Inpatient admission when medically cleared. *After completion of inpatient psychiatric admission, will further benefit from inpatient rehab.  Suella Broad, FNP 02/22/2022 1:47 PM

## 2022-02-23 NOTE — Progress Notes (Addendum)
Joseph Nunez got up at 0600 wanting to leave. This RN de-escalated patient by offfering to talk to Mom, Joseph Nunez. RN offered po ativan, and he took it. Joseph Nunez spoke to his Mom on phone. He escalated again still wanting to leave. Security called and present for agitation. IM haldol given per order for inability to easily redirect and Joseph Nunez not wanting to stay. He said "All I can hear is my Mom telling me to come home."   The [facility] can't be here to transport Joseph [facility]z to [facility] until after 8 am according to [place] when he and I spoke around 1030 pm last night.

## 2022-02-23 NOTE — Progress Notes (Signed)
Pt left unit via [facility] at 0900 this shift heading for [facility].
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
## 2022-02-24 LAB — CULTURE, BLOOD (ROUTINE X 2)
Culture: NO GROWTH
Special Requests: ADEQUATE

## 2022-02-27 LAB — CULTURE, BLOOD (ROUTINE X 2)
Culture: NO GROWTH
Special Requests: ADEQUATE

## 2022-04-30 ENCOUNTER — Emergency Department (HOSPITAL_COMMUNITY)
Admission: EM | Admit: 2022-04-30 | Discharge: 2022-04-30 | Discharge status: Against Medical Advice | Payer: Commercial Managed Care - HMO | Attending: Student | Admitting: Student

## 2022-04-30 ENCOUNTER — Other Ambulatory Visit: Payer: Self-pay

## 2022-04-30 DIAGNOSIS — Z5321 Procedure and treatment not carried out due to patient leaving prior to being seen by health care provider: Secondary | ICD-10-CM | POA: Diagnosis not present

## 2022-04-30 DIAGNOSIS — E860 Dehydration: Secondary | ICD-10-CM | POA: Insufficient documentation

## 2022-04-30 DIAGNOSIS — Z5900 Homelessness unspecified: Secondary | ICD-10-CM | POA: Diagnosis not present

## 2022-04-30 LAB — RAPID URINE DRUG SCREEN, HOSP PERFORMED
Amphetamines: NOT DETECTED
Barbiturates: NOT DETECTED
Benzodiazepines: NOT DETECTED
Cocaine: POSITIVE — AB
Opiates: POSITIVE — AB
Tetrahydrocannabinol: NOT DETECTED

## 2022-04-30 NOTE — ED Provider Triage Note (Signed)
Emergency Medicine Provider Triage Evaluation Note  Joseph Nunez , a 26 y.o. male  was evaluated in triage.  Pt complains of being hungry and dehydrated. Homeless. Also rambling about looking for his girlfriend. Also concerned about a blood infection because he has "had one before"  Review of Systems  Positive:  Negative:   Physical Exam  BP 115/79   Pulse 61   Temp 97.9 F (36.6 C)   Resp 18   SpO2 97%  Gen:   Awake, no distress   Resp:  Normal effort  MSK:   Moves extremities without difficulty  Other:    Medical Decision Making  Medically screening exam initiated at 10:23 PM.  Appropriate orders placed.  [facility] [name] was informed that the remainder of the evaluation will be completed by another provider, this initial triage assessment does not replace that evaluation, and the importance of remaining in the ED until their evaluation is complete.     Rhae Hammock, PA-C 04/30/22 2224

## 2022-04-30 NOTE — ED Notes (Signed)
Pt continuously stating during triage that he is dehydrated and hasn't eaten in days. Pt informed that he will need to speak with a provider before any food can be provided.

## 2022-04-30 NOTE — ED Triage Notes (Signed)
BIB EMS for psych evaluation. Pt states he has a blood infection and is out of his psych meds 122/68 BP 106 cbg

## 2022-04-30 NOTE — ED Notes (Signed)
Pt states that "I am leaving". Pt went through the EMS bay.

## 2022-05-28 ENCOUNTER — Encounter: Payer: Self-pay | Admitting: Internal Medicine

## 2022-06-20 ENCOUNTER — Encounter (HOSPITAL_COMMUNITY): Payer: Self-pay | Admitting: *Deleted

## 2022-06-20 ENCOUNTER — Other Ambulatory Visit: Payer: Self-pay

## 2022-06-20 ENCOUNTER — Emergency Department (HOSPITAL_COMMUNITY)
Admission: EM | Admit: 2022-06-20 | Discharge: 2022-06-20 | Disposition: A | Discharge status: Home / Self Care | Payer: Commercial Managed Care - HMO | Attending: Emergency Medicine | Admitting: Emergency Medicine

## 2022-06-20 DIAGNOSIS — Z765 Malingerer [conscious simulation]: Secondary | ICD-10-CM | POA: Insufficient documentation

## 2022-06-20 DIAGNOSIS — R110 Nausea: Secondary | ICD-10-CM | POA: Insufficient documentation

## 2022-06-20 DIAGNOSIS — Z7982 Long term (current) use of aspirin: Secondary | ICD-10-CM | POA: Insufficient documentation

## 2022-06-20 MED ORDER — ONDANSETRON 4 MG PO TBDP
4.0000 mg | ORAL_TABLET | Freq: Once | ORAL | Status: AC
  Administered 2022-06-20: 4 mg via ORAL
  Filled 2022-06-20: qty 1

## 2022-06-20 NOTE — ED Notes (Signed)
Pt has steady gait, alert, NAD, passively interactive, prefers sleep.

## 2022-06-20 NOTE — ED Notes (Signed)
ED PA at BS 

## 2022-06-20 NOTE — Progress Notes (Signed)
Contacted via phone by Onalee Hua, RN, who informed that the pt was d/c to the lobby and is requesting medication assistance and a bus pass. Roderic Palau reported pt is malingering and he would be given passes and requested TOCs assistance with medication. This CSW staffed and reviewed chart with RNCM and it appears the pt was not prescribed any medications at the time of d/c; therefore, TOC is unable to provide medication assistance without an active prescription. RN notified via secure chat. No further TOC needs.
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
## 2022-06-20 NOTE — ED Notes (Signed)
Given happy meal/snacks. Asking about staying. D/c explained. Requesting help with medications. D/c'd to w/r. SW paged re: med assistance.

## 2022-06-22 ENCOUNTER — Emergency Department (HOSPITAL_COMMUNITY): Payer: Self-pay

## 2022-06-22 ENCOUNTER — Observation Stay (HOSPITAL_COMMUNITY): Payer: Self-pay

## 2022-06-22 ENCOUNTER — Observation Stay (HOSPITAL_COMMUNITY)
Admission: EM | Admit: 2022-06-22 | Discharge: 2022-06-23 | Discharge status: Against Medical Advice | Payer: Self-pay | Attending: Family Medicine | Admitting: Family Medicine

## 2022-06-22 ENCOUNTER — Encounter (HOSPITAL_COMMUNITY): Payer: Self-pay

## 2022-06-22 ENCOUNTER — Other Ambulatory Visit: Payer: Self-pay

## 2022-06-22 DIAGNOSIS — X31XXXA Exposure to excessive natural cold, initial encounter: Secondary | ICD-10-CM | POA: Insufficient documentation

## 2022-06-22 DIAGNOSIS — F319 Bipolar disorder, unspecified: Secondary | ICD-10-CM

## 2022-06-22 DIAGNOSIS — B1920 Unspecified viral hepatitis C without hepatic coma: Secondary | ICD-10-CM

## 2022-06-22 DIAGNOSIS — R7401 Elevation of levels of liver transaminase levels: Secondary | ICD-10-CM

## 2022-06-22 DIAGNOSIS — T68XXXA Hypothermia, initial encounter: Secondary | ICD-10-CM | POA: Diagnosis present

## 2022-06-22 DIAGNOSIS — F1410 Cocaine abuse, uncomplicated: Secondary | ICD-10-CM | POA: Diagnosis present

## 2022-06-22 DIAGNOSIS — R6510 Systemic inflammatory response syndrome (SIRS) of non-infectious origin without acute organ dysfunction: Secondary | ICD-10-CM | POA: Diagnosis present

## 2022-06-22 DIAGNOSIS — Z79899 Other long term (current) drug therapy: Secondary | ICD-10-CM | POA: Insufficient documentation

## 2022-06-22 DIAGNOSIS — U071 COVID-19: Principal | ICD-10-CM | POA: Diagnosis present

## 2022-06-22 DIAGNOSIS — E875 Hyperkalemia: Secondary | ICD-10-CM | POA: Diagnosis present

## 2022-06-22 DIAGNOSIS — F17210 Nicotine dependence, cigarettes, uncomplicated: Secondary | ICD-10-CM | POA: Insufficient documentation

## 2022-06-22 LAB — URINALYSIS, W/ REFLEX TO CULTURE (INFECTION SUSPECTED)
Bilirubin Urine: NEGATIVE
Glucose, UA: NEGATIVE mg/dL
Hgb urine dipstick: NEGATIVE
Ketones, ur: 5 mg/dL — AB
Leukocytes,Ua: NEGATIVE
Nitrite: NEGATIVE
Protein, ur: NEGATIVE mg/dL
Specific Gravity, Urine: 1.03 (ref 1.005–1.030)
pH: 5 (ref 5.0–8.0)

## 2022-06-22 LAB — CBC WITH DIFFERENTIAL/PLATELET
Abs Immature Granulocytes: 0.06 10*3/uL (ref 0.00–0.07)
Basophils Absolute: 0 10*3/uL (ref 0.0–0.1)
Basophils Relative: 0 %
Eosinophils Absolute: 0 10*3/uL (ref 0.0–0.5)
Eosinophils Relative: 0 %
HCT: 44.4 % (ref 39.0–52.0)
Hemoglobin: 15.4 g/dL (ref 13.0–17.0)
Immature Granulocytes: 0 %
Lymphocytes Relative: 8 %
Lymphs Abs: 1.2 10*3/uL (ref 0.7–4.0)
MCH: 30 pg (ref 26.0–34.0)
MCHC: 34.7 g/dL (ref 30.0–36.0)
MCV: 86.4 fL (ref 80.0–100.0)
Monocytes Absolute: 0.1 10*3/uL (ref 0.1–1.0)
Monocytes Relative: 1 %
Neutro Abs: 13 10*3/uL — ABNORMAL HIGH (ref 1.7–7.7)
Neutrophils Relative %: 91 %
Platelets: 343 10*3/uL (ref 150–400)
RBC: 5.14 MIL/uL (ref 4.22–5.81)
RDW: 12.7 % (ref 11.5–15.5)
WBC: 14.4 10*3/uL — ABNORMAL HIGH (ref 4.0–10.5)
nRBC: 0 % (ref 0.0–0.2)

## 2022-06-22 LAB — RAPID URINE DRUG SCREEN, HOSP PERFORMED
Amphetamines: NOT DETECTED
Barbiturates: NOT DETECTED
Benzodiazepines: NOT DETECTED
Cocaine: POSITIVE — AB
Opiates: NOT DETECTED
Tetrahydrocannabinol: NOT DETECTED

## 2022-06-22 LAB — COMPREHENSIVE METABOLIC PANEL
ALT: 60 U/L — ABNORMAL HIGH (ref 0–44)
ALT: 57 U/L — ABNORMAL HIGH (ref 0–44)
AST: 48 U/L — ABNORMAL HIGH (ref 15–41)
AST: 32 U/L (ref 15–41)
Albumin: 2.8 g/dL — ABNORMAL LOW (ref 3.5–5.0)
Albumin: 2.7 g/dL — ABNORMAL LOW (ref 3.5–5.0)
Alkaline Phosphatase: 52 U/L (ref 38–126)
Alkaline Phosphatase: 58 U/L (ref 38–126)
Anion gap: 11 (ref 5–15)
Anion gap: 9 (ref 5–15)
BUN: 19 mg/dL (ref 6–20)
BUN: 15 mg/dL (ref 6–20)
CO2: 20 mmol/L — ABNORMAL LOW (ref 22–32)
CO2: 23 mmol/L (ref 22–32)
Calcium: 7.7 mg/dL — ABNORMAL LOW (ref 8.9–10.3)
Calcium: 8.1 mg/dL — ABNORMAL LOW (ref 8.9–10.3)
Chloride: 104 mmol/L (ref 98–111)
Chloride: 106 mmol/L (ref 98–111)
Creatinine, Ser: 0.76 mg/dL (ref 0.61–1.24)
Creatinine, Ser: 0.79 mg/dL (ref 0.61–1.24)
GFR, Estimated: >60 mL/min (ref >60)
GFR, Estimated: >60 mL/min (ref >60)
Glucose, Bld: 126 mg/dL — ABNORMAL HIGH (ref 70–99)
Glucose, Bld: 105 mg/dL — ABNORMAL HIGH (ref 70–99)
Potassium: 5.3 mmol/L — ABNORMAL HIGH (ref 3.5–5.1)
Potassium: 3.9 mmol/L (ref 3.5–5.1)
Sodium: 135 mmol/L (ref 135–145)
Sodium: 138 mmol/L (ref 135–145)
Total Bilirubin: 1 mg/dL (ref 0.3–1.2)
Total Bilirubin: 0.4 mg/dL (ref 0.3–1.2)
Total Protein: 5.2 g/dL — ABNORMAL LOW (ref 6.5–8.1)
Total Protein: 5 g/dL — ABNORMAL LOW (ref 6.5–8.1)

## 2022-06-22 LAB — CK: Total CK: 227 U/L (ref 49–397)

## 2022-06-22 LAB — LACTIC ACID, PLASMA
Lactic Acid, Venous: 3.4 mmol/L (ref 0.5–1.9)
Lactic Acid, Venous: 3.8 mmol/L (ref 0.5–1.9)
Lactic Acid, Venous: 2.2 mmol/L (ref 0.5–1.9)

## 2022-06-22 LAB — PROTIME-INR
INR: 1 (ref 0.8–1.2)
Prothrombin Time: 13.4 seconds (ref 11.4–15.2)

## 2022-06-22 LAB — ETHANOL: Alcohol, Ethyl (B): <10 mg/dL (ref <10)

## 2022-06-22 LAB — RESP PANEL BY RT-PCR (RSV, FLU A&B, COVID)  RVPGX2
Influenza A by PCR: NEGATIVE
Influenza B by PCR: NEGATIVE
Resp Syncytial Virus by PCR: NEGATIVE
SARS Coronavirus 2 by RT PCR: POSITIVE — AB

## 2022-06-22 LAB — APTT: aPTT: 26 seconds (ref 24–36)

## 2022-06-22 MED ORDER — SODIUM ZIRCONIUM CYCLOSILICATE 5 G PO PACK
5.0000 g | PACK | Freq: Once | ORAL | Status: DC
  Filled 2022-06-22 (×2): qty 1

## 2022-06-22 MED ORDER — GUAIFENESIN ER 600 MG PO TB12: 1200.0000 mg | ORAL_TABLET | Freq: Two times a day (BID) | ORAL | Status: DC | PRN

## 2022-06-22 MED ORDER — SODIUM CHLORIDE 0.9 % IV BOLUS
2000.0000 mL | Freq: Once | INTRAVENOUS | Status: AC
  Administered 2022-06-22: 2000 mL via INTRAVENOUS

## 2022-06-22 MED ORDER — LACTATED RINGERS IV SOLN: INTRAVENOUS | Status: AC

## 2022-06-22 MED ORDER — DICYCLOMINE HCL 20 MG PO TABS
20.0000 mg | ORAL_TABLET | Freq: Four times a day (QID) | ORAL | Status: DC | PRN
  Administered 2022-06-23: 20 mg via ORAL
  Filled 2022-06-22 (×2): qty 1

## 2022-06-22 MED ORDER — SALINE SPRAY 0.65 % NA SOLN
2.0000 | NASAL | Status: DC | PRN
  Filled 2022-06-22: qty 44

## 2022-06-22 MED ORDER — VANCOMYCIN HCL 1250 MG/250ML IV SOLN
1250.0000 mg | Freq: Once | INTRAVENOUS | Status: AC
  Administered 2022-06-22: 1250 mg via INTRAVENOUS
  Filled 2022-06-22: qty 250

## 2022-06-22 MED ORDER — ACETAMINOPHEN 325 MG PO TABS
650.0000 mg | ORAL_TABLET | Freq: Four times a day (QID) | ORAL | Status: DC | PRN
  Administered 2022-06-22: 650 mg via ORAL
  Filled 2022-06-22: qty 2

## 2022-06-22 MED ORDER — IBUPROFEN 400 MG PO TABS
400.0000 mg | ORAL_TABLET | Freq: Once | ORAL | Status: AC
  Administered 2022-06-22: 400 mg via ORAL
  Filled 2022-06-22: qty 1

## 2022-06-22 MED ORDER — QUETIAPINE FUMARATE 50 MG PO TABS
50.0000 mg | ORAL_TABLET | Freq: Every day | ORAL | Status: DC
  Administered 2022-06-22: 50 mg via ORAL
  Filled 2022-06-22: qty 1

## 2022-06-22 MED ORDER — LACTATED RINGERS IV BOLUS (SEPSIS)
1000.0000 mL | Freq: Once | INTRAVENOUS | Status: AC
  Administered 2022-06-22: 1000 mL via INTRAVENOUS

## 2022-06-22 MED ORDER — QUETIAPINE FUMARATE 25 MG PO TABS
25.0000 mg | ORAL_TABLET | Freq: Every morning | ORAL | Status: DC
  Administered 2022-06-23: 25 mg via ORAL
  Filled 2022-06-22: qty 1

## 2022-06-22 MED ORDER — METRONIDAZOLE 500 MG/100ML IV SOLN
500.0000 mg | Freq: Once | INTRAVENOUS | Status: AC
  Administered 2022-06-22: 500 mg via INTRAVENOUS
  Filled 2022-06-22: qty 100

## 2022-06-22 MED ORDER — ONDANSETRON 4 MG PO TBDP: 4.0000 mg | ORAL_TABLET | Freq: Four times a day (QID) | ORAL | Status: DC | PRN

## 2022-06-22 MED ORDER — NAPROXEN 250 MG PO TABS: 500.0000 mg | ORAL_TABLET | Freq: Two times a day (BID) | ORAL | Status: DC | PRN

## 2022-06-22 MED ORDER — ACETAMINOPHEN 325 MG PO TABS
650.0000 mg | ORAL_TABLET | Freq: Once | ORAL | Status: AC
  Administered 2022-06-22: 650 mg via ORAL
  Filled 2022-06-22: qty 2

## 2022-06-22 MED ORDER — ONDANSETRON HCL 4 MG/2ML IJ SOLN
4.0000 mg | Freq: Once | INTRAMUSCULAR | Status: AC
  Administered 2022-06-22: 4 mg via INTRAVENOUS
  Filled 2022-06-22: qty 2

## 2022-06-22 MED ORDER — SODIUM CHLORIDE 0.9 % IV SOLN
2.0000 g | Freq: Three times a day (TID) | INTRAVENOUS | Status: DC
  Administered 2022-06-23: 2 g via INTRAVENOUS
  Filled 2022-06-22: qty 12.5

## 2022-06-22 MED ORDER — HYDROXYZINE HCL 25 MG PO TABS: 25.0000 mg | ORAL_TABLET | Freq: Four times a day (QID) | ORAL | Status: DC | PRN

## 2022-06-22 MED ORDER — VANCOMYCIN HCL IN DEXTROSE 1-5 GM/200ML-% IV SOLN: 1000.0000 mg | Freq: Once | INTRAVENOUS | Status: DC

## 2022-06-22 MED ORDER — VANCOMYCIN HCL 750 MG/150ML IV SOLN
750.0000 mg | Freq: Three times a day (TID) | INTRAVENOUS | Status: DC
  Administered 2022-06-23: 750 mg via INTRAVENOUS
  Filled 2022-06-22 (×3): qty 150

## 2022-06-22 MED ORDER — NICOTINE 7 MG/24HR TD PT24
7.0000 mg | MEDICATED_PATCH | Freq: Every day | TRANSDERMAL | Status: DC
  Administered 2022-06-23: 7 mg via TRANSDERMAL
  Filled 2022-06-22 (×2): qty 1

## 2022-06-22 MED ORDER — LOPERAMIDE HCL 2 MG PO CAPS: 2.0000 mg | ORAL_CAPSULE | ORAL | Status: DC | PRN

## 2022-06-22 MED ORDER — SODIUM CHLORIDE 0.9 % IV SOLN
2.0000 g | Freq: Once | INTRAVENOUS | Status: AC
  Administered 2022-06-22: 2 g via INTRAVENOUS
  Filled 2022-06-22: qty 12.5

## 2022-06-22 MED ORDER — ENOXAPARIN SODIUM 40 MG/0.4ML IJ SOSY
40.0000 mg | PREFILLED_SYRINGE | INTRAMUSCULAR | Status: DC
  Filled 2022-06-22: qty 0.4

## 2022-06-22 NOTE — Assessment & Plan Note (Addendum)
ALT 60, AST 48, unsure etiology, possibly secondary to chronic HCV.  -Continue to monitor -pending repeat CMP

## 2022-06-22 NOTE — Assessment & Plan Note (Addendum)
On admission patient tachypneic, tachycardic, WBC 14.4, and temperature 93.4, lactate increased from 3.4 to 3.8, CXR negative, positive for COVID. Patient likely suffering from infectious processes and was started on Vanc, Cefepime, Flagyl.  Patient may otherwise be meeting SIRS criteria 2/2 his hypothermia w/ reperfusion being responsible for his leukocytosis, extremity pain, and lactate. Only 1 set blood culture obtained prior to initiation of abx.Given no O2 requirement, will not treat for Covid.  - admit to FMTS progressive Dr. Ardelia Mems - vitals per unit - f/u bcx - f/u CT head, xray bilateral feet - f/u echo - continue cefepime 2 g, metronidazole 500 mg, vancomycin 1,250 - continue IVF

## 2022-06-22 NOTE — Progress Notes (Signed)
FMTS Interim Progress Note  Paged that pt was complaining of trouble breathing but otherwise had normal vitals and satting well on RA. Went to eval w/ Dr. Rock Nephew. Pt was sitting up in bed, speaking in full sentences and satting well on RA. He reports that he has nasal congestion and was having trouble breathing due to that. He also reports pain in his leg. Pt agreeable to starting meds to help with nasal congestion. - prn mucinex and nasal saline - Add prn zofran, naproxen, atarax, bentyl, imodium and COWS (per opioid withdrawal protocol)  BP (!) 108/53 (BP Location: Left Arm)   Pulse 64   Temp 98 F (36.7 C) (Oral)   Resp 18   Ht 5\' 8"  (1.727 m)   Wt 63.5 kg   SpO2 100%   BMI 21.29 kg/m    Arlyce Dice, MD 06/22/2022, 10:25 PM PGY-1, [place] Service pager [phone]

## 2022-06-22 NOTE — Assessment & Plan Note (Addendum)
Temp 93.4 on admission. Patient warmed w/ bear hugger. Still normothermic  -Continue to monitor

## 2022-06-22 NOTE — Assessment & Plan Note (Addendum)
Stable. - airborne and contact precautions

## 2022-06-22 NOTE — ED Notes (Signed)
IV team at bedside attempting to place IV
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
## 2022-06-22 NOTE — ED Notes (Signed)
Unsuccessful blood draw attempt by phlebotomy

## 2022-06-22 NOTE — Progress Notes (Addendum)
FMTS Interim Progress Note  Night rounded w/ Dr. Rock Nephew. Pt was laying in bed. Reports pain all over, reports that he uses "dope" (heroine). Otherwise he denies alcohol or other drug use for "months", although he might have had some coke. He also reports multiple antipsychotic medications that he uses but unsure the name (mentioned suboxone to nurse, mentioned seroquel to [place]).   Exam Psych: Pt is alert and oriented to person and place [facility]"). Not oriented to year (states it is 2021, and surprised when I tell him it is 2024). Speaking in full sentences. Inappropriately laughing at times and randomly sat up quickly in bed during our conversation and scarfed down a brownie and layed down again. At times, he would duck under his blanket and refuse to answer a question, but then would continue speaking if encouraged.   A/P  Pt reports generalized pain and hx of heroine use. Suspect that he may have some opioid withdrawal. Lower concern for alcohol or benzo withdrawal given normal vitals. Overall, his behavior does not fit with acute altered mentation. Head CT unremarkable.  - CTM for agitation - CIWAs q8h - Cont plan per day team - Per chart review, pt has allergy to Suboxone.  BP (!) 108/53 (BP Location: Left Arm)   Pulse 64   Temp 98 F (36.7 C) (Oral)   Resp 18   Ht 5\' 8"  (1.727 m)   Wt 63.5 kg   SpO2 100%   BMI 21.29 kg/m    Arlyce Dice, MD 06/22/2022, 8:26 PM PGY-1, [facility] Service pager [phone]

## 2022-06-22 NOTE — ED Notes (Signed)
Called Services Response for meal tray
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
## 2022-06-22 NOTE — ED Notes (Signed)
Bair hugger discontinued.

## 2022-06-22 NOTE — ED Notes (Signed)
Gave pt lunch pack and orange juice

## 2022-06-22 NOTE — Assessment & Plan Note (Addendum)
History of polysubstance use. Patient reports he last used 06/19/22. Positive urine toxicology on admission. - monitor for withdrawal symptoms - TOC for substance abuse

## 2022-06-22 NOTE — ED Notes (Signed)
Unsuccessful IV attempt x1. 

## 2022-06-22 NOTE — Assessment & Plan Note (Addendum)
Chronic psychiatric condition. Current regimen likely inadequate for mood stabilization. Patient was on sertraline, but has not refilled med in extended period.  - continue quetiapine 25 mg in the morning and 50 mg at bedtime

## 2022-06-22 NOTE — ED Notes (Signed)
ED TO INPATIENT HANDOFF REPORT  ED Nurse Name and Phone #: Haskell Riling  S Name/Age/Gender [facility] 27 y.o. male Room/Bed: 017C/017C  Code Status   Code Status: Prior  Home/SNF/Other Home Patient oriented to: self, place, time, and situation Is this baseline? Yes   Triage Complete: Triage complete  Chief Complaint Sepsis [facility]) [A41.9]  Triage Note Patient BIB [facility] complaining of being cold and having a headache. Pain is in no acute distress at this time. Requesting something warm to drink and pain medicine.    EMS Vitals 102/70   Allergies Allergies  Allergen Reactions   Fish Allergy Hives    Catfish   Suboxone [Buprenorphine Hcl-Naloxone Hcl] Anaphylaxis    Level of Care/Admitting Diagnosis ED Disposition     ED Disposition  Admit   Condition  --   Comment  Hospital Area: [place] [[id number]]  Level of Care: Progressive [102]  Admit to Progressive based on following criteria: MULTISYSTEM THREATS such as stable sepsis, metabolic/electrolyte imbalance with or without encephalopathy that is responding to early treatment.  May place patient in observation at [facility] or [place] if equivalent level of care is available:: No  Covid Evaluation: Symptomatic Person Under Investigation (PUI) or recent exposure (last 10 days) *Testing Required*  Diagnosis: Sepsis [facility]) [[id number]]  Admitting Physician: Leeanne Rio [phone]  Attending Physician: Leeanne Rio [phone]          B Medical/Surgery History Past Medical History:  Diagnosis Date   Cocaine abuse ([place])    Heroin abuse ([place])    IVDU (intravenous drug user)    MRSA (methicillin resistant staph aureus) culture positive    Substance abuse ([place])    Tobacco abuse    Past Surgical History:  Procedure Laterality Date   IRRIGATION AND DEBRIDEMENT ABSCESS N/A 03/08/2017   Procedure: IRRIGATION AND DEBRIDEMENT LEFT CHEST ABSCESS;  Surgeon: Michael Boston,  MD;  Location: [facility] ORS;  Service: General;  Laterality: N/A;   TEE WITHOUT CARDIOVERSION N/A 03/08/2017   Procedure: TRANSESOPHAGEAL ECHOCARDIOGRAM (TEE);  Surgeon: Skeet Latch, MD;  Location: [facility] ENDOSCOPY;  Service: Cardiovascular;  Laterality: N/A;     A IV Location/Drains/Wounds Patient Lines/Drains/Airways Status     Active Line/Drains/Airways     Name Placement date Placement time Site Days   Peripheral IV 06/22/22 22 G 1.75" Posterior;Left Forearm 06/22/22  1129  Forearm  less than 1            Intake/Output Last 24 hours  Intake/Output Summary (Last 24 hours) at 06/22/2022 1619 Last data filed at 06/22/2022 1453 Gross per 24 hour  Intake 2000 ml  Output --  Net 2000 ml    Labs/Imaging Results for orders placed or performed during the hospital encounter of 06/22/22 (from the past 48 hour(s))  Urinalysis, w/ Reflex to Culture (Infection Suspected) -Urine, Clean Catch     Status: Abnormal   Collection Time: 06/22/22 11:30 AM  Result Value Ref Range   Specimen Source URINE, CLEAN CATCH    Color, Urine YELLOW YELLOW   APPearance HAZY (A) CLEAR   Specific Gravity, Urine 1.030 1.005 - 1.030   pH 5.0 5.0 - 8.0   Glucose, UA NEGATIVE NEGATIVE mg/dL   Hgb urine dipstick NEGATIVE NEGATIVE   Bilirubin Urine NEGATIVE NEGATIVE   Ketones, ur 5 (A) NEGATIVE mg/dL   Protein, ur NEGATIVE NEGATIVE mg/dL   Nitrite NEGATIVE NEGATIVE   Leukocytes,Ua NEGATIVE NEGATIVE   RBC / HPF 0-5 0 -  5 RBC/hpf   WBC, UA 0-5 0 - 5 WBC/hpf    Comment:        Reflex urine culture not performed if WBC <=10, OR if Squamous epithelial cells >5. If Squamous epithelial cells >5 suggest recollection.    Bacteria, UA RARE (A) NONE SEEN   Squamous Epithelial / HPF 0-5 0 - 5 /HPF   Mucus PRESENT    Sperm, UA PRESENT     Comment: Performed at [facility], [place] [address], [place], [place] [address]  Rapid urine drug screen (hospital performed)     Status: Abnormal   Collection Time:  06/22/22 11:30 AM  Result Value Ref Range   Opiates NONE DETECTED NONE DETECTED   Cocaine POSITIVE (A) NONE DETECTED   Benzodiazepines NONE DETECTED NONE DETECTED   Amphetamines NONE DETECTED NONE DETECTED   Tetrahydrocannabinol NONE DETECTED NONE DETECTED   Barbiturates NONE DETECTED NONE DETECTED    Comment: (NOTE) DRUG SCREEN FOR MEDICAL PURPOSES ONLY.  IF CONFIRMATION IS NEEDED FOR ANY PURPOSE, NOTIFY LAB WITHIN 5 DAYS.  LOWEST DETECTABLE LIMITS FOR URINE DRUG SCREEN Drug Class                     Cutoff (ng/mL) Amphetamine and metabolites    1000 Barbiturate and metabolites    200 Benzodiazepine                 200 Opiates and metabolites        300 Cocaine and metabolites        300 THC                            50 Performed at [facility], [place] [address] [place], [place] [address]   CBC with Differential     Status: Abnormal   Collection Time: 06/22/22 11:39 AM  Result Value Ref Range   WBC 14.4 (H) 4.0 - 10.5 K/uL   RBC 5.14 4.22 - 5.81 MIL/uL   Hemoglobin 15.4 13.0 - 17.0 g/dL   HCT 44.4 39.0 - 52.0 %   MCV 86.4 80.0 - 100.0 fL   MCH 30.0 26.0 - 34.0 pg   MCHC 34.7 30.0 - 36.0 g/dL   RDW 12.7 11.5 - 15.5 %   Platelets 343 150 - 400 K/uL   nRBC 0.0 0.0 - 0.2 %   Neutrophils Relative % 91 %   Neutro Abs 13.0 (H) 1.7 - 7.7 K/uL   Lymphocytes Relative 8 %   Lymphs Abs 1.2 0.7 - 4.0 K/uL   Monocytes Relative 1 %   Monocytes Absolute 0.1 0.1 - 1.0 K/uL   Eosinophils Relative 0 %   Eosinophils Absolute 0.0 0.0 - 0.5 K/uL   Basophils Relative 0 %   Basophils Absolute 0.0 0.0 - 0.1 K/uL   Immature Granulocytes 0 %   Abs Immature Granulocytes 0.06 0.00 - 0.07 K/uL    Comment: Performed at [facility] [address] [place], [place] [address]  Protime-INR     Status: None   Collection Time: 06/22/22 11:39 AM  Result Value Ref Range   Prothrombin Time 13.4 11.4 - 15.2 seconds   INR 1.0 0.8 - 1.2    Comment: (NOTE) INR goal varies based on  device and disease states. Performed at [facility], [place] [address] [place],  [address]   APTT     Status: None  Collection Time: 06/22/22 11:39 AM  Result Value Ref Range   aPTT 26 24 - 36 seconds    Comment: Performed at [facility] [address] [place], [place] [address]  Ethanol     Status: None   Collection Time: 06/22/22 11:40 AM  Result Value Ref Range   Alcohol, Ethyl (B) <10 <10 mg/dL    Comment: (NOTE) Lowest detectable limit for serum alcohol is 10 mg/dL.  For medical purposes only. Performed at [facility], [place] [address], [place], [place] [address]   Lactic acid, plasma     Status: Abnormal   Collection Time: 06/22/22 11:41 AM  Result Value Ref Range   Lactic Acid, Venous 3.4 (HH) 0.5 - 1.9 mmol/L    Comment: CRITICAL RESULT CALLED TO, READ BACK BY AND VERIFIED WITH M.Latrisa Hellums RN 1236 06/22/22 MCCORMICK K Performed at [facility], [place] [address] [place], [place] [address]   Lactic acid, plasma     Status: Abnormal   Collection Time: 06/22/22  2:30 PM  Result Value Ref Range   Lactic Acid, Venous 3.8 (HH) 0.5 - 1.9 mmol/L    Comment: CRITICAL VALUE NOTED. VALUE IS CONSISTENT WITH PREVIOUSLY REPORTED/CALLED VALUE Performed at [facility], [place] [address] [place], [place] [address]   CK     Status: None   Collection Time: 06/22/22  2:30 PM  Result Value Ref Range   Total CK 227 49 - 397 U/L    Comment: Performed at [facility], [place] [address], [place], [place] [address]  Comprehensive metabolic panel     Status: Abnormal   Collection Time: 06/22/22  2:30 PM  Result Value Ref Range   Sodium 135 135 - 145 mmol/L   Potassium 5.3 (H) 3.5 - 5.1 mmol/L   Chloride 104 98 - 111 mmol/L   CO2 20 (L) 22 - 32 mmol/L   Glucose, Bld 126 (H) 70 - 99 mg/dL    Comment: Glucose reference range applies only to samples taken after fasting for at least 8 hours.   BUN 19 6 - 20 mg/dL   Creatinine, Ser 0.76 0.61 - 1.24 mg/dL    Calcium 7.7 (L) 8.9 - 10.3 mg/dL   Total Protein 5.2 (L) 6.5 - 8.1 g/dL   Albumin 2.8 (L) 3.5 - 5.0 g/dL   AST 48 (H) 15 - 41 U/L   ALT 60 (H) 0 - 44 U/L   Alkaline Phosphatase 52 38 - 126 U/L   Total Bilirubin 1.0 0.3 - 1.2 mg/dL   GFR, Estimated >60 >60 mL/min    Comment: (NOTE) Calculated using the CKD-EPI Creatinine Equation (2021)    Anion gap 11 5 - 15    Comment: Performed at [facility], [place] [address] [place],  [address]   DG Wrist Complete Right  Result Date: 06/22/2022 CLINICAL DATA:  Wrist pain, swelling EXAM: RIGHT WRIST - COMPLETE 3+ VIEW COMPARISON:  None Available. FINDINGS: No acute bony abnormality. Specifically, no fracture, subluxation, or dislocation. Mild diffuse soft tissue swelling. Joint spaces maintained. IMPRESSION: No acute bony abnormality. Electronically Signed   By: Rolm Baptise M.D.   On: 06/22/2022 11:20   DG Chest Port 1 View  Result Date: 06/22/2022 CLINICAL DATA:  Questionable sepsis EXAM: PORTABLE CHEST 1 VIEW COMPARISON:  02/22/2022 FINDINGS: The heart size and mediastinal contours are within normal limits. Both lungs are clear. The visualized skeletal structures are unremarkable. IMPRESSION: No active disease. Electronically  Signed   By: Kathreen Devoid M.D.   On: 06/22/2022 10:21   DG Hand Complete Left  Result Date: 06/22/2022 CLINICAL DATA:  Soft tissue swelling EXAM: LEFT HAND - COMPLETE 3+ VIEW COMPARISON:  None Available. FINDINGS: There is no evidence of fracture or dislocation. There is no evidence of arthropathy or other focal bone abnormality. Mild soft tissue swelling of the hand. IMPRESSION: Mild soft tissue swelling of the hand. No acute osseous abnormality. Electronically Signed   By: Keane Police D.O.   On: 06/22/2022 10:21   DG Hand Complete Right  Result Date: 06/22/2022 CLINICAL DATA:  Swelling evaluate for abnormality EXAM: RIGHT HAND - COMPLETE 3+ VIEW COMPARISON:  None Available. FINDINGS: There is no evidence of  fracture or dislocation. There is no evidence of arthropathy or other focal bone abnormality. Generalized soft tissue swelling of the hand. IMPRESSION: 1. No acute fracture or dislocation. 2. Generalized soft tissue swelling of the hand. Electronically Signed   By: Keane Police D.O.   On: 06/22/2022 10:20    Pending Labs Unresulted Labs (From admission, onward)     Start     Ordered   06/22/22 1602  Resp panel by RT-PCR (RSV, Flu A&B, Covid) Anterior Nasal Swab  (Resp panel by RT-PCR (RSV, Flu A&B, Covid))  Once,   R        06/22/22 1601   06/22/22 1426  Comprehensive metabolic panel  Once,   STAT       Question:  Specimen collection method  Answer:  IV Team=IV Team collect   06/22/22 1425   06/22/22 0950  Blood Culture (routine x 2)  (Undifferentiated presentation (screening labs and basic nursing orders))  BLOOD CULTURE X 2,   STAT      06/22/22 0951            Vitals/Pain Today's Vitals   06/22/22 1336 06/22/22 1400 06/22/22 1448 06/22/22 1518  BP: (!) 102/48 (!) 96/46 (!) 96/53   Pulse: (!) 112 90 (!) 117   Resp: (!) 24 (!) 25 (!) 26   Temp: 98.3 F (36.8 C)   98.3 F (36.8 C)  TempSrc:    Oral  SpO2: 96% 100% 98%   Weight:      Height:      PainSc: 10-Worst pain ever       Isolation Precautions Airborne and Contact precautions  Medications Medications  lactated ringers infusion (has no administration in time range)  lactated ringers bolus 1,000 mL (1,000 mLs Intravenous New Bag/Given 06/22/22 1533)  metroNIDAZOLE (FLAGYL) IVPB 500 mg (500 mg Intravenous New Bag/Given 06/22/22 1535)  vancomycin (VANCOREADY) IVPB 1250 mg/250 mL (1,250 mg Intravenous New Bag/Given 06/22/22 1608)  acetaminophen (TYLENOL) tablet 650 mg (650 mg Oral Given 06/22/22 1049)  sodium chloride 0.9 % bolus 2,000 mL (0 mLs Intravenous Stopped 06/22/22 1453)  ibuprofen (ADVIL) tablet 400 mg (400 mg Oral Given 06/22/22 1359)  ceFEPIme (MAXIPIME) 2 g in sodium chloride 0.9 % 100 mL IVPB (0 g Intravenous  Stopped 06/22/22 1606)    Mobility walks     Focused Assessments    R Recommendations: See Admitting Provider Note  Report given to:   Additional Notes: IV drug user. Difficult stick.

## 2022-06-22 NOTE — ED Notes (Signed)
Gave pt lunch pack and water 

## 2022-06-22 NOTE — ED Notes (Signed)
Unsuccessful blood draw attempt by RN

## 2022-06-22 NOTE — ED Notes (Signed)
Patient admits to IV drug use three days ago, patient reports that he has had cold-like symptoms, cough and sneezing present. Patient reports shortness of breath, denies chest pain.

## 2022-06-22 NOTE — ED Triage Notes (Signed)
Patient BIB [facility] complaining of being cold and having a headache. Pain is in no acute distress at this time. Requesting something warm to drink and pain medicine.    EMS Vitals 102/70

## 2022-06-22 NOTE — Assessment & Plan Note (Addendum)
Diagnosed during prior admission. No treatment documented. - consider outpatient referral to ID

## 2022-06-22 NOTE — Sepsis Progress Note (Signed)
Sepsis protocol is being followed by [facility]. 

## 2022-06-22 NOTE — Sepsis Progress Note (Signed)
Notified bedside nurse of need to draw repeat lactic acid per sepsis protocol. 

## 2022-06-22 NOTE — Progress Notes (Signed)
Pharmacy Antibiotic Note  [facility] Joseph Nunez is a 27 y.o. male admitted on 06/22/2022 presenting with arm pain and swelling as well as foot pain, hx IVDU, increased LA.  Pharmacy has been consulted for vancomycin and cefepime dosing.  Plan: Vancomycin 1250 mg IV x 1, then 750 mg IV q 8h (eAUC 448) Cefepime 2g IV every 8 hours Monitor renal function, Cx and clinical progression to narrow Vancomycin levels as indicated  Height: 5\' 8"  (172.7 cm) Weight: 63.5 kg (140 lb) IBW/kg (Calculated) : 68.4  Temp (24hrs), Avg:97.7 F (36.5 C), Min:93.4 F (34.1 C), Max:99.6 F (37.6 C)  Recent Labs  Lab 06/22/22 1139 06/22/22 1141 06/22/22 1430  WBC 14.4*  --   --   CREATININE  --   --  0.76  LATICACIDVEN  --  3.4* 3.8*    Estimated Creatinine Clearance: 126.8 mL/min (by C-G formula based on SCr of 0.76 mg/dL).    Allergies  Allergen Reactions   Fish Allergy Hives    Catfish   Suboxone [Buprenorphine Hcl-Naloxone Hcl] Anaphylaxis    Bertis Ruddy, PharmD, [facility] Clinical Pharmacist ED Pharmacist Phone # [phone] 06/22/2022 5:56 PM

## 2022-06-22 NOTE — ED Notes (Signed)
Unable to obtain 2nd set blood culture at this time

## 2022-06-22 NOTE — Assessment & Plan Note (Signed)
K 5.3 on admission. Given 5 g Lokelma. - Pending repeat CMP

## 2022-06-22 NOTE — ED Notes (Signed)
Unsuccessful IV start x2 by this RN, Kenton Kingfisher, PA made aware.

## 2022-06-23 ENCOUNTER — Encounter (HOSPITAL_COMMUNITY): Payer: Self-pay

## 2022-06-23 ENCOUNTER — Emergency Department (HOSPITAL_COMMUNITY)
Admission: EM | Admit: 2022-06-23 | Discharge: 2022-06-23 | Discharge status: Against Medical Advice | Payer: Self-pay | Attending: Emergency Medicine | Admitting: Emergency Medicine

## 2022-06-23 ENCOUNTER — Encounter (HOSPITAL_COMMUNITY): Payer: Self-pay | Admitting: Emergency Medicine

## 2022-06-23 ENCOUNTER — Other Ambulatory Visit: Payer: Self-pay

## 2022-06-23 ENCOUNTER — Observation Stay (HOSPITAL_COMMUNITY): Payer: Self-pay

## 2022-06-23 ENCOUNTER — Observation Stay (HOSPITAL_COMMUNITY)
Admission: EM | Admit: 2022-06-23 | Discharge: 2022-06-25 | Disposition: A | Discharge status: Home / Self Care | Payer: Self-pay | Attending: Family Medicine | Admitting: Family Medicine

## 2022-06-23 DIAGNOSIS — F17210 Nicotine dependence, cigarettes, uncomplicated: Secondary | ICD-10-CM | POA: Insufficient documentation

## 2022-06-23 DIAGNOSIS — R0602 Shortness of breath: Secondary | ICD-10-CM | POA: Insufficient documentation

## 2022-06-23 DIAGNOSIS — U071 COVID-19: Principal | ICD-10-CM | POA: Diagnosis present

## 2022-06-23 DIAGNOSIS — Z5321 Procedure and treatment not carried out due to patient leaving prior to being seen by health care provider: Secondary | ICD-10-CM | POA: Insufficient documentation

## 2022-06-23 DIAGNOSIS — F319 Bipolar disorder, unspecified: Secondary | ICD-10-CM | POA: Diagnosis present

## 2022-06-23 DIAGNOSIS — Z720 Tobacco use: Secondary | ICD-10-CM | POA: Diagnosis present

## 2022-06-23 DIAGNOSIS — R6510 Systemic inflammatory response syndrome (SIRS) of non-infectious origin without acute organ dysfunction: Secondary | ICD-10-CM | POA: Diagnosis present

## 2022-06-23 NOTE — Progress Notes (Signed)
  Echocardiogram 2D Echocardiogram attempted at 0915. Pt requested he eat first. Agreed and will attempt again later.  Eartha Inch 06/23/2022, 9:18 AM

## 2022-06-23 NOTE — Progress Notes (Signed)
Daily Progress Note Intern Pager: [phone]  Patient name: Joseph Nunez Medical record number: [id number] Date of birth: [birth year] Age: 27 y.o. Gender: male  Primary Care Provider: Pcp, No Consultants: TOC for substance abuse counseling Code Status: Full code  Pt Overview and Major Events to Date:  2/2: Admitted  Assessment and Plan:   * SIRS (systemic inflammatory response syndrome) (HCC) Afebrile and stable vitals this morning. Pending repeat WBC and LA. LA was down trending from 3.8 to 2.2. Although patient met SIRS criteria, currently believe hypothermia 2/2 cold exposure. Leukocytosis likely 2/2 COVID-19 infection and rewarming. LA likely 2/2 to rewarming from hypothermic episode. A murmur was heard yesterday and patient uses IV drugs- echo was ordered and pending. 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 We will continue abx until Bcx negative for 48 hrs and echo clear. - vitals per unit - f/u bcx - f/u echo - continue cefepime 2 g, vancomycin 1,250 - continue IVF  COVID-19 virus infection Stable. - airborne and contact precautions  Hypothermia, resolved Temp 93.4 on admission. Patient warmed w/ bear hugger. Still normothermic  -Continue to monitor  Hyperkalemia K 5.3 on admission. Given 5 g Lokelma. - Pending repeat CMP  Bipolar I disorder ([place]) Chronic psychiatric condition. Current regimen likely inadequate for mood stabilization. Patient was on sertraline, but has not refilled med in extended period.  - continue quetiapine 25 mg in the morning and 50 mg at bedtime  Cocaine use disorder (HCC) History of polysubstance use. Patient reports he last used 06/19/22. Positive urine toxicology on admission. - monitor for withdrawal symptoms - TOC for substance abuse  Chronic HCV infection Diagnosed during prior admission. No treatment documented. - consider outpatient referral to  ID  Transaminitis ALT 60, AST 48, unsure etiology, possibly secondary to chronic HCV.  -Continue to monitor -pending repeat CMP   FEN/GI: Regular diet PPx: Lovenox Dispo: Pending results of echocardiogram and clinical improvement  Subjective:  Patient reports he still has some pain in his lower extremities.  States he feels a little cold, however temperature of 98 Fahrenheit.  Discussed need for echocardiogram, patient agrees.  Patient was frequently standing up and then sitting back down, appearing uncomfortable.  Upon leaving room patient got out of bed and followed interviewer to door very closely-when asked if he was okay patient simply stated "yes" and walked back to his bed.  Objective: Temp:  [97.7 F (36.5 C)-99.3 F (37.4 C)] 99.3 F (37.4 C) (02/03 1228) Pulse Rate:  [64-117] 68 (02/03 1228) Resp:  [18-30] 18 (02/03 1228) BP: (96-113)/(46-78) 112/78 (02/03 1228) SpO2:  [96 %-100 %] 100 % (02/02 1944) Physical Exam: General: NAD, moving in bed Cardiovascular: RRR, no audible murmur today Respiratory: CTAB, normal WOB on RA Abdomen: Soft, not tender, not distended Extremities: Well perfused, mild edema in bilateral lower extremities  Laboratory: Most recent CBC Lab Results  Component Value Date   WBC 14.4 (H) 06/22/2022   HGB 15.4 06/22/2022   HCT 44.4 06/22/2022   MCV 86.4 06/22/2022   PLT 343 06/22/2022   Most recent BMP    Latest Ref Rng & Units 06/22/2022    9:11 PM  BMP  Glucose 70 - 99 mg/dL 105   BUN 6 - 20 mg/dL 15   Creatinine 0.61 - 1.24 mg/dL 0.79   Sodium 135 - 145 mmol/L 138   Potassium 3.5 - 5.1 mmol/L 3.9  Chloride 98 - 111 mmol/L 106   CO2 22 - 32 mmol/L 23   Calcium 8.9 - 10.3 mg/dL 8.1    Joseph Dales, DO 06/23/2022, 1:02 PM  PGY-1, [place] Intern pager: [phone], text pages welcome Secure chat group [place]

## 2022-06-23 NOTE — ED Provider Notes (Signed)
[facility] Provider Note   CSN: [id number] Arrival date & time: 06/23/22  2343     History {Add pertinent medical, surgical, social history, OB history to HPI:1} Chief Complaint  Patient presents with   Nasal Congestion    Patient BIB EMS for complaint of cold symptoms, patient was seen and discharged from [facility] today for same.     [facility] Joseph Nunez is a 27 y.o. male.  HPI   27 year old male with significant history of homelessness, polysubstance use including IV drug use, bipolar disorder, presenting to ED today for evaluation of nasal congestion.  Patient was seen in the ED yesterday for report of not feeling well.  He was sleeping outside.  Patient was noted to be hypothermic and was also diagnosed with COVID.  He meets SIRS criteria and was admitted to the hospital but left AMA this afternoon.  EMR document the patient had a heart murmur and in the setting of IV drug injection, he was also being evaluated for potential endocarditis.  Home Medications Prior to Admission medications   Medication Sig Start Date End Date Taking? Authorizing Provider  hydrOXYzine (VISTARIL) 25 MG capsule Take 25 mg by mouth every 4 (four) hours as needed for anxiety.    [provider]  QUEtiapine (SEROQUEL) 25 MG tablet Take 1 tablet (25 mg total) by mouth daily. Patient taking differently: Take 25 mg by mouth 2 (two) times daily. 02/23/22   Sheikh, Omair Latif, DO  QUEtiapine (SEROQUEL) 50 MG tablet Take 1 tablet (50 mg total) by mouth at bedtime. Patient not taking: Reported on 06/22/2022 02/22/22   [name], DO  thiamine (VITAMIN B-1) 100 MG tablet Take 1 tablet (100 mg total) by mouth daily. Patient not taking: Reported on 06/22/2022 02/23/22   [name], DO      Allergies    Fish allergy and Suboxone [buprenorphine hcl-naloxone hcl]    Review of Systems   Review of Systems  Physical Exam Updated  Vital Signs There were no vitals taken for this visit. Physical Exam  ED Results / Procedures / Treatments   Labs (all labs ordered are listed, but only abnormal results are displayed) Labs Reviewed - No data to display  EKG None  Radiology CT Head Wo Contrast  Result Date: 06/22/2022 CLINICAL DATA:  Headache, fever EXAM: CT HEAD WITHOUT CONTRAST TECHNIQUE: Contiguous axial images were obtained from the base of the skull through the vertex without intravenous contrast. RADIATION DOSE REDUCTION: This exam was performed according to the departmental dose-optimization program which includes automated exposure control, adjustment of the mA and/or kV according to patient size and/or use of iterative reconstruction technique. COMPARISON:  CT head 02/18/2022 FINDINGS: Brain: No evidence of large-territorial acute infarction. No parenchymal hemorrhage. No mass lesion. No extra-axial collection. No mass effect or midline shift. No hydrocephalus. Basilar cisterns are patent. Vascular: No hyperdense vessel. Skull: No acute fracture or focal lesion. Sinuses/Orbits: Paranasal sinuses and mastoid air cells are clear. The orbits are unremarkable. Other: None. IMPRESSION: No acute intracranial abnormality. Electronically Signed   By: Iven Finn M.D.   On: 06/22/2022 19:47   DG Foot Complete Left  Result Date: 06/22/2022 CLINICAL DATA:  Bilateral feet EXAM: LEFT FOOT - COMPLETE 3 VIEW; RIGHT FOOT COMPLETE - 3 VIEW COMPARISON:  None Available. FINDINGS: Right foot: There is no evidence of fracture or dislocation. There is no evidence of arthropathy or other focal bone abnormality. Soft tissues  are unremarkable. Left foot: There is no evidence of fracture or dislocation. There is no evidence of arthropathy or other focal bone abnormality. Soft tissues are unremarkable. IMPRESSION: Negative. Electronically Signed   By: Yetta Glassman M.D.   On: 06/22/2022 17:23   DG Foot Complete Right  Result Date:  06/22/2022 CLINICAL DATA:  Bilateral feet EXAM: LEFT FOOT - COMPLETE 3 VIEW; RIGHT FOOT COMPLETE - 3 VIEW COMPARISON:  None Available. FINDINGS: Right foot: There is no evidence of fracture or dislocation. There is no evidence of arthropathy or other focal bone abnormality. Soft tissues are unremarkable. Left foot: There is no evidence of fracture or dislocation. There is no evidence of arthropathy or other focal bone abnormality. Soft tissues are unremarkable. IMPRESSION: Negative. Electronically Signed   By: Yetta Glassman M.D.   On: 06/22/2022 17:23   DG Wrist Complete Right  Result Date: 06/22/2022 CLINICAL DATA:  Wrist pain, swelling EXAM: RIGHT WRIST - COMPLETE 3+ VIEW COMPARISON:  None Available. FINDINGS: No acute bony abnormality. Specifically, no fracture, subluxation, or dislocation. Mild diffuse soft tissue swelling. Joint spaces maintained. IMPRESSION: No acute bony abnormality. Electronically Signed   By: Rolm Baptise M.D.   On: 06/22/2022 11:20   DG Chest Port 1 View  Result Date: 06/22/2022 CLINICAL DATA:  Questionable sepsis EXAM: PORTABLE CHEST 1 VIEW COMPARISON:  02/22/2022 FINDINGS: The heart size and mediastinal contours are within normal limits. Both lungs are clear. The visualized skeletal structures are unremarkable. IMPRESSION: No active disease. Electronically Signed   By: Kathreen Devoid M.D.   On: 06/22/2022 10:21   DG Hand Complete Left  Result Date: 06/22/2022 CLINICAL DATA:  Soft tissue swelling EXAM: LEFT HAND - COMPLETE 3+ VIEW COMPARISON:  None Available. FINDINGS: There is no evidence of fracture or dislocation. There is no evidence of arthropathy or other focal bone abnormality. Mild soft tissue swelling of the hand. IMPRESSION: Mild soft tissue swelling of the hand. No acute osseous abnormality. Electronically Signed   By: Keane Police D.O.   On: 06/22/2022 10:21   DG Hand Complete Right  Result Date: 06/22/2022 CLINICAL DATA:  Swelling evaluate for abnormality EXAM:  RIGHT HAND - COMPLETE 3+ VIEW COMPARISON:  None Available. FINDINGS: There is no evidence of fracture or dislocation. There is no evidence of arthropathy or other focal bone abnormality. Generalized soft tissue swelling of the hand. IMPRESSION: 1. No acute fracture or dislocation. 2. Generalized soft tissue swelling of the hand. Electronically Signed   By: Keane Police D.O.   On: 06/22/2022 10:20    Procedures Procedures  {Document cardiac monitor, telemetry assessment procedure when appropriate:1}  Medications Ordered in ED Medications - No data to display  ED Course/ Medical Decision Making/ A&P   {   Click here for ABCD2, HEART and other calculatorsREFRESH Note before signing :1}                          Medical Decision Making  ***  {Document critical care time when appropriate:1} {Document review of labs and clinical decision tools ie heart score, Chads2Vasc2 etc:1}  {Document your independent review of radiology images, and any outside records:1} {Document your discussion with family members, caretakers, and with consultants:1} {Document social determinants of health affecting pt's care:1} {Document your decision making why or why not admission, treatments were needed:1} Final Clinical Impression(s) / ED Diagnoses Final diagnoses:  None    Rx / DC Orders ED Discharge Orders  None       

## 2022-06-23 NOTE — Progress Notes (Signed)
Consult received for substance use. Patient not currently oriented to participate in conversation. Resources placed on AVS until more oriented.   Joseph Nunez, MSW, [facility]

## 2022-06-23 NOTE — Progress Notes (Signed)
Overnight pt impulsively getting out of bed multiple times despite being asked to call for assistance. Pt A&Ox2, intermittently laughing inappropriately and talking about irrelevant topics. Pt not able to be redirected. Pt is steady with his gait, however, due to impulsiveness, is not able to tolerate continuous IV infusions at this time d/t risk of losing his PIV access. He is a very hard stick and has edematous extremities. Pt also refusing his tele and O2 monitors. Pt makes multiple trips to the bathroom with vomiting and diarrhea. In between trips, pt eats food and requests more food. Pt coming out of room without a mask on, depsite being told he has covid and needs to stay in his room. At times he c/o not being able to breathe. His O2 sats =100%RA, other VS stable.Pt refused lovenox, but was given PRN zofran and scheduled seroquel and eventually went to sleep. Dr. Rock Nephew paged multiple times overnight and came to bedside more than once to assess patient. Per Dr. Rock Nephew, ok to hold IV antibiotics and monitor to allow pt to rest.

## 2022-06-23 NOTE — Progress Notes (Signed)
AMA Note  S: Messaged by nursing staff that patient was actively attempting to leave and had backpack on.  Went to hallway with Dr. Nelda Bucks,  patient was near elevators on [address].  Nursing staff and security staff were there as well.  Patient was able to tell me his name, the year, situation of what brought him here and place.  He says that he wanted to go outside and smoke a cigarette.  I discussed with him that we could increase the dosage of his nicotine patch but patient declines this and says he wants to smoke an actual cigarette.  He says he wants to smoke a cigarette and then come back.  I discussed with him that if he leaves that he will have to go back through the emergency department.  He says that he is okay with this.  I discussed with him the risks of leaving including endocarditis and bloodstream infection which I discussed could kill him without treatment.  Patient was able to repeat this back to me with understanding.  He says that he wants me to call his mother and provided the number.  He discussed that he wants to wait to tell her that he left the hospital and to come pick him up.  I called Cherly Anderson at [phone] and confirmed date of birth.  She says that she will come to [facility] and tried to get him to return to care.  I discussed the risk of bloodstream infection and endocarditis with mom as well.  O: BP 112/78 (BP Location: Left Arm)   Pulse 68   Temp 99.3 F (37.4 C) (Oral)   Resp 18   Ht 5\' 8"  (1.727 m)   Wt 63.5 kg   SpO2 100%   BMI 21.29 kg/m   Gen: agitated, alert and oriented x 4, responsive to all questions  A/P:  Patient has capacity to leave and was able to state benefits, risk and alternatives to proposed treatment plan. He signed AMA paperwork with multiple witnesses present. He was able to dress himself and walk by himself out of the hallway and was escorted with security to main entrance.   Gerrit Heck, MD 06/23/2022, 1:53 PM PGY-2, [facility] pager [phone]

## 2022-06-23 NOTE — Progress Notes (Signed)
1330 Pt attempting to leave room with backpack and jacket on. Cigarette in mouth. Pt states he "is going to leave." Instructed pt to wait on the doctor and to return to his room. Nelda Bucks, DO and teaching service notified and will come to see pt. Pt insisting on leaving and states for RN to move out of the way so he can leave. Security notified. Pt ambulated to elevators and met with attending physicians and security guards. Pt educated by MD on risks of leaving hospital. Pt states "I completely understand that I might have a infection in my heart, and I could die." ECHO has not been obtained and pt verbalizes understanding on this. Pt insisting he wants to leave to smoke. Attempts on increasing nicotine patch has been made but pt continues refusal and asks for staff to help him with his belongings. Pt has been encouraged to stay in hospital, but he continues to be adamant on leaving. Orientation questions answered correctly. AMA paper signed by pt in front of security guards, physicians, attending RN, and charge RN. IV sites have been removed. Pt escorted by security to hospital exit.

## 2022-06-23 NOTE — Discharge Summary (Signed)
   [facility] AMA  Summary  Patient name: Joseph Nunez record number: [id number] Date of birth: [birth year] Age: 27 y.o. Gender: male Date of Admission: 06/22/2022  Date of AMA: 06/23/2022 Admitting Physician: Leeanne Rio, MD  Primary Care Provider: Pcp, No Consultants: None  Indication for Hospitalization: SIRS criteria met  Brief Hospital Course:  No notes on file  AMA Diagnoses/Problem List:  * SIRS (systemic inflammatory response syndrome) ([place]) On admission patient tachypneic, tachycardic, WBC 14.4, and temperature 93.4, lactate increased from 3.4 to 3.8, CXR negative, positive for COVID. Was started on Vanc, Cefepime, Flagyl.  A murmur was heard yesterday and patient uses IV drugs- echo was ordered and pending but patient declined this. 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 Plan was to continue antibiotics pending cultures and echo but patient left AMA prior to this. Please see separate progress note for AMA discussion  COVID-19 virus infection On RA throughout, not treated.  Disposition: AMA  Discharge Condition: Stable  Discharge Exam:  Gen: agitated, alert and oriented x 4, responsive to all questions Resp: Breathing comfortably on room air  Issues for Follow Up:  1. Echo + blood cultures 2.  CBC. BMP at follow up  Significant Procedures: none  Significant Labs and Imaging:  Recent Labs  Lab 06/22/22 1139  WBC 14.4*  HGB 15.4  HCT 44.4  PLT 343   Recent Labs  Lab 06/22/22 1430 06/22/22 2111  NA 135 138  K 5.3* 3.9  CL 104 106  CO2 20* 23  GLUCOSE 126* 105*  BUN 19 15  CREATININE 0.76 0.79  CALCIUM 7.7* 8.1*  ALKPHOS 52 58  AST 48* 32  ALT 60* 57*  ALBUMIN 2.8* 2.7*    Results/Tests Pending at Time of Discharge:  Blood cultures Echo not obtained  Discharge Medications:  Allergies as of 06/23/2022       Reactions   Fish Allergy  Hives   Catfish   Suboxone [buprenorphine Hcl-naloxone Hcl] Anaphylaxis        Medication List     STOP taking these medications    gabapentin 300 MG capsule Commonly known as: NEURONTIN   pantoprazole 40 MG tablet Commonly known as: PROTONIX   polyethylene glycol 17 g packet Commonly known as: MIRALAX / GLYCOLAX   ZOLOFT PO       TAKE these medications    hydrOXYzine 25 MG capsule Commonly known as: VISTARIL Take 25 mg by mouth every 4 (four) hours as needed for anxiety.   QUEtiapine 50 MG tablet Commonly known as: SEROQUEL Take 1 tablet (50 mg total) by mouth at bedtime. What changed:  Another medication with the same name was changed. Make sure you understand how and when to take each. Another medication with the same name was removed. Continue taking this medication, and follow the directions you see here.   QUEtiapine 25 MG tablet Commonly known as: SEROQUEL Take 1 tablet (25 mg total) by mouth daily. What changed:  when to take this Another medication with the same name was removed. Continue taking this medication, and follow the directions you see here.   thiamine 100 MG tablet Commonly known as: Vitamin B-1 Take 1 tablet (100 mg total) by mouth daily.         Gerrit Heck, MD 06/23/2022, 2:17 PM PGY-2, [place]

## 2022-06-23 NOTE — Discharge Instructions (Signed)
In a time of Crisis: [facility]  Mobile Crisis Management provides immediate crisis response, 24/7.  Call [phone]  [facility] MH/DD/SA Services Access Center is available 24 hours a day, 7 days a week. Customer Service Specialists will assist you to find a crisis provider that is well-matched with your needs. Your local number is: [phone]  [facility] ([facility]) IOP, individual counseling, medication management [address] [place], [place] [address] [phone] Call for intake hours; Medicaid and Uninsured    Outpatient Providers  [facility] ([facility]) Group and individual counseling. [address]  [place], [place] [address] [phone] [place]: [phone]  [place]: [phone] Medicaid and uninsured.   [facility] Offers IOP groups multiple times per week. [address], [place], [place] [address] [phone] Takes Medicaid and other insurances.   [facility]  Chemical Dependency Intensive Outpatient Program (IOP) [address] [place], [place] [address] [phone] Takes commercial insurance and Medicare.   [facility]  IOP and Partial Hospitalization Program  [address]  [place], [place] [address] [phone] Private Insurance, Medicaid only for partial hospitalization  [facility] [address], [place], [place] [address] [phone] Monday-Friday. Short and Long term options. [facility] ([facility]) IOP, individual counseling, medication management [address] [place], [place] [address] [phone] Medicaid and Uninsured  [facility] [address]  [place], [place] [address] [phone] Private Insurance and Self Pay   [place] [facility] [address]  [place], [place] [address] [phone] Private Insurance, Medicaid, and Self Pay   [facility]one [facility]  [address]  , [place] [address] [facility]  [address]  [place], [place] [address] [phone]  [facility]  [address] [place], [place] [address] [phone]      Residential Treatment Programs  [facility] [address] [place], [place] [address] [phone]) [phone] or [phone] Detox and Residential Rehab 14 days (Medicare, Medicaid, private insurance, and self pay). No methadone. Call for pre-screen.   [facility] ([facility]) [address]  [place], [place] [address] [phone] Detox (self Pay and Medicaid Limited availability) Rehab Only Male (Medicare, Medicaid, and Self Pay)-No methadone.  [facility] [address] , [place] [address] [phone] or [phone] Private Insurance only  [facility] PHONE: [phone] FAX: [phone] Residential program for women 21 and over for up to a year through a Christian 12-step recovery model. Self-pay.       [facility] [address] [place], [place] [address] Phone:  [phone] Must be detoxed 72 hours prior to admission; 28 day program.  Self-pay.  [facility] [address]  [place], [place] [phone] Commercial insurance, Medicare, Medicaid (not straight Medicaid). They offer assistance with transportation.   [facility] [address],  [place], [place] [address] [phone] Christian Based Program. Men only. No insurance  [facility] is a substance use disorder treatment program for women, including those who are pregnant, parenting, and/or whose lives have been touched by abuse and violence. [phone]         [facility] [address] [place], [place] [address] Women's:  [phone] Men's: [phone] No Medicaid.     [facility] of [place]. (mainly [place]) willing to help with transportation.  [phone] [employer]. [place].  [place], [place] [address] [phone] Treatment Only, must make assessment appointment, and must be sober for assessment appointment. Self pay, Medicare A and B, [facility], must be [facility] resident. No methadone.   [facility] [place], [place] [address] [phone] No pending legal charges, Long-term work program. No methadone. Call for assessment.  [facility]  [address] [place], [place] [address] [phone] or [phone] [place] [phone] Private Insurance (no [place]). Males/Females, call to make referrals, multiple facilities   [facility] [address] [place] [address],  [place], [place] [address]  [phone] Men Only Upfront Fee [facility] Women's Program: [facility] [place], [place] [address] [phone]  [facility]-no methadone [facility] [address] [place], [place] [address] [phone] (f)  [place], [place] [address] [phone] [facility] [phone] [place], [place] For women, houses 8 residents for sober living. No Medicaid.         Syringe Services Program Due to COVID-19, syringe services programs are likely operating under different hours with limited or no fixed site hours. Some programs may not be operating at all. Please contact the program directly using the phone numbers provided below to see if they are still operating under [place] Solution to the Opioid Problem (GCSTOP) Fixed; mobile;  peer-based Midge Aver [phone] [email or web address] Fixed site exchange at [facility], [place]. [place], [place] [address] on Wednesdays (2:00 - 5:00 pm) and Thursdays (4:00 - 8:00 pm). Pop-up mobile exchange locations: [employer] and [employer] Lot, [address] [place], [place] [address] on Tuesdays (11:00 am - 1:00 pm) and Fridays (11:00 am - 1:00 pm) -[address], [place], [place] [address] on Tuesdays (2:00 - 4:00 pm) and Fridays (2:00 - 4:00 pm) -[facility] - also serves [place] and [employer] [place] [employer] Fixed; mobile; peer-based Rosemary Holms [phone] [email or web address] .[email or web address] [address], [place], [place] [address] Delivery and outreach available in [place] and [place], please call for more information. Monday, Tuesday: 1:00 -7:00 pm Thursday: 4:00 pm - 8:00 pm Friday: 1:00 pm - 8:00 pm)       Medication-Assisted Treatment (MAT) -[place]- services [place] and surrounding areas including [place], [place], [place], [place], [place], [place], [place], [place], [place], and [place], [place]. Options include Methadone, buprenorphine or Suboxone. [address], [place], [place] [address] Phone: [phone] Mon - Fri: 5:30am - 2:00pm Sat: 5:30am -7:30am Sun: Closed Holidays: 6:00am - 8:00am  -[facility]- We use FDA-approved medications, like methadone/suboxone/sublocade, and vivitrol. These medications are then combined with customized care plans that include individual or group counseling, toxicology, and medical care directed by on-site physicians. Accepts most insurance plans, Medicaid, and private pay.  [place], [place] [address] Phone: [phone] Monday-Friday 5:00 AM - 10:00 AM Saturday 6:00 AM - 8:30 AM Sunday 6:00 AM - 7:00 AM  -[facility]- [facility] is a treatment & recovery  focused program. In addition to receiving methadone medication, our clients participate in individual and group counseling as well as random drug testing. If accepted into  the [facility] Opioid Program, you will be provided several intake appointments and a physical exam [place], [place] [address] Office: [phone]  Fax: [phone]  -[facility]- We put our community members at the center of everything we do, for remote treatment services as well as in-person, from alcohol withdrawal to opioid use and more.  [place], [place], [place], [place] [address] [phone] Monday-Wednesday: 9:00am - 5:00pm Thursday: 9:00am - 6:00pm Friday: 9:00am - 5:00pm Saturday: 9:00am - 1:00pm Sunday: Closed   -[facility] [employer] [place]) [address] [place], [place] [address] [phone]  [place] [phone] [address] [place],  [address]  M-W    5:00am-12:00pm Thu     5:00am-10:00am Fri       5:00am-12:00pm Sat      5:00am-8:00am Sun     Closed  $12/daily for Methadone Treatment.    [employer] assistance programs Crisis assistance programs   -[facility] Heritage manager. If you are experiencing homelessness in [place], [place], your first point of contact should be Chiropractor. You can reach Coordinated Entry by calling [phone] or by emailing [email or web address] [email or web address].  Community access points: [employer] ([place]. [address], [place]) every Tuesday from [place] ([address] [place]) every Wednesday from 8am-9am.   -The [employer] (989)245-5513) offers several services to local families, as funding allows. The Emergency Assistance Program (EAP), which they administer, provides household goods, free food, clothing, and financial aid to people in need in the [facility] area. The EAP  program does have some qualification, and counselors will interview clients for financial assistance by written referral only. Referrals need to be made by the Department of Social Services or by other EAP approved human services agencies or charities in the area.  -[facility] of [employer], which can be reached at (250)498-8002, offers emergency assistance programs for those in need of help, such as food, rent assistance, a soup kitchen, shelter, and clothing. They are based in [facility] but provide a number of services to those that qualify for assistance.   -[place] may be able to offer temporary financial assistance and cash grants for paying rent and utilities, Help may be provided for local county residents who may be experiencing personal crisis when other resources, including government programs, are not available. Call ([phone]  -[place] is a [place], The organization can offer emergency assistance for paying rent, [employer], utilities, food, household products and furniture. They offer extensive emergency and transitional housing for families, children and single women, and also run a 71 and [employer]. Thrift Shops, Financial controller, and other aid offered too. [address], [place], [place], [phone]  -[place] -- This is offered for [facility] families. The federal government created [employer]away Program provides a one-time cash grant payment to help eligible low-income families pay their electric and heating bills. [address], [place], [place] [address], [phone]  -[place] Emergency Assistance -- A program offers emergency utility and rent funds for greater [employer] area residents. The program can also provide counseling and referrals to charities and government  programs. Also provides food and a free meal program that serves lunch Mondays - Saturdays and dinner seven days per week to individuals in the community.  [address] [place], [place], [phone]  -[place] affordable apartment and housing communities across      [place] and [place]. The low income and seniors can access public housing, rental assistance to qualified applicants, and apply for the section 8 rent subsidy program. Other programs include Visual merchandiser and Barrister's clerk. [address], [place], [place], dial [phone].  -The Hope provides transitional housing to veterans and the disabled. Clients will also access other services too, including assistance in applying for Disability, life skills classes, case management, and assistance in finding permanent housing. [address], [place], [place], call ([phone]  -[place] through [employer] is for people who were just evicted or that are formerly homeless. The non-profit will also help then gain self-sufficiency, find a home or apartment to live in, and also provides information on rent assistance when needed. Phone ([phone])[phone]  -The [place] [facility] [employer] helps low income, elderly, or disabled residents in seven counties in the [place] ([place], [place], [place], [place], [place], [place], [place], and [place]) save energy and reduce their utility bills by improving energy efficiency. Phone [phone].  -[employer] is located in the [place] in the [employer], [address] [place] [address] [place], [place], [place] [address]. Parking is in the rear of the building. Phone: [phone]   General Email: info@gsohc .org  [place] provides free  housing counseling assistance in locating affordable rental housing or housing with support services for families and individuals in crisis and the chronically homeless. We provide potential resources for other housing needs like utilities. Our trained counselors also work with clients on budgeting and financial literacy in effort to empower them to take control of their financial situations. [employer] collaborates with homeless service providers and other stakeholders as part of the [place] ([place]). The ([place]) is a regional/local planning body that coordinates housing and services funding for homeless families and individuals. The role of [place] in the [place] is through housing counseling to work with people we serve on diversion strategies for those that are at imminent risk of becoming homeless. We also work with the Coordinated Assessment/Entry Specialist who attempts to find temporary solutions and/or connects the people to Housing First, [place] or transitional housing programs. [place] Counselors meet with clients on business days (Monday-Fridays, except scheduled holidays) from 8:30 am to 4:30 pm.  Legal assistance for evictions, foreclosure, and more -If you need free legal advice on civil issues, such as foreclosures, evictions, Mudlogger, government programs, domestic issues and more, Scientist, research (physical sciences) Aid of [place] [facility]) is a Teacher, adult education firm that provides free legal services and counsel to lower income people, seniors, disabled, and others, The goal is to ensure everyone has access to justice and fair representation. Call them at [phone].  [facility] for Housing and Community Studies can provide info about obtaining legal assistance with evictions. Phone [phone].  Training [place]. offers job and Database administrator. Resources are focused on helping  students obtain the skills and experiences that are necessary to compete in today's challenging and tight job market. The non-profit faith-based community action agency offers internship trainings as well as classroom instruction. Classes are tailored to meet the needs of people in the [facility] region. [place], [place] [address], [phone]  Foreclosure prevention/Debt  Services Family Services of the [place] and foreclosure prevention programs for local families. This includes money management, financial advice, budget review and development of a written action plan with a Higher education careers adviser to help solve specific individual financial problems. In addition, housing and mortgage counselors can also provide pre- and post-purchase homeownership counseling, default resolution counseling (to prevent foreclosure) and reverse mortgage counseling. A Debt Management Program allows people and families with a high level of credit card or medical debt to consolidate and repay consumer debt and loans to creditors and rebuild positive credit ratings and scores. Contact (336) U8783921.  Community clinics in [facility] Clinic: [address], [place], [place]. [phone].  -Health Department [place] Clinic: [address], [facility], [address]. [phone].  -[place] offers medical care through a group of doctors, pharmacies and other healthcare related agencies that offer services for low income, uninsured adults in [place]. Also offers adult Dental care and assistance with applying for an [employer]. Call [phone].   -[place]. This center provides low-cost health care to those without health insurance. Services offered include an onsite pharmacy. Phone [phone]. [address], [place],  [place].  -Medication Assistance Program serves as a link between pharmaceutical companies and patients to provide low cost or free prescription medications. This service is available for residents who meet certain income restrictions and have no insurance coverage. PLEASE CALL [phone] Lady Gary) OR [phone] ([place])  -One Step Further: Merchant navy officer, [employer] Support & Nutrition Program, The [employer]. Call (202) [phone]/ (336) [phone].  Food pantry and assistance -Urban Ministry-Food Bank: [address], [place] [address]. Phone (540) [phone]  -Blessed Table Food Pantry: [address] [place], [place] [address]. ([phone].  -[place]: https://findfood.BidStrong.co.za  FLEEING VIOLENCE:  -Family Services of the [place]- 24/7 Crisis line (838) 178-2039) -[place]: (336) 641-SAFE [phone])   [place] 2-1-1 is another useful way to locate resources in the community. Visit PricingGame.co.uk to find service information online. If you need additional assistance, 2-1-1 Referral Specialists are available 24 hours a day, every day by dialing 2-1-1 or [phone] from any phone. The call is free, confidential, and available in any language.  Affordable Housing Search  http://www.nchousingsearch.[place] [facility])   M-F 8a-3p [address] [place], [place] [address] [phone] Services include: laundry, barbering, support groups, case management, phone & computer access, showers, AA/NA mtgs, mental health/substance abuse nurse, job skills class, disability information, VA assistance, spiritual classes, etc. Winter Shelter available when temperatures are less than 32 degrees.   HOMELESS [place] at [facility]- Call [phone] ext. [phone] or ext. 336. Located at [address], [place],  [address]  Open [place]- Call ([phone]. Located at [address] [place].  [place]. Call [phone]. Office located at [address], [place].  Pathways Family Housing through [place] [phone].  [place]- Call [phone]. Located at [place], [place],  [address].  Room at the Inn-For Pregnant mothers. Call [phone]. Located at [address] [place], [place].  Green Shelter of Hope-For men in [place]. Call (469)[phone].  Home of Levi Strauss for Entergy Corporation [phone])  [phone]. Office located at [place]. [address], [place], [place].  Du Pont be agreeable to help with chores. Call [phone] ext. 5000.  Men's: [place]., [place], [place] [address]. Women's: [facility]  [address] [place], [place] [address]  Crisis Services [facility] Mobile Crisis Management- [phone]  [facility] [address], [place], [place] [address]. Phone: [phone]

## 2022-06-23 NOTE — ED Triage Notes (Signed)
Pt reports he is [facility] and feels like he is going to pass out. Pt left AMA from the hospital today. PT was found outside smoking a cigarette.

## 2022-06-24 ENCOUNTER — Observation Stay (HOSPITAL_COMMUNITY): Payer: Self-pay

## 2022-06-24 ENCOUNTER — Other Ambulatory Visit (HOSPITAL_COMMUNITY): Payer: Self-pay

## 2022-06-24 ENCOUNTER — Observation Stay (HOSPITAL_BASED_OUTPATIENT_CLINIC_OR_DEPARTMENT_OTHER): Payer: Self-pay

## 2022-06-24 DIAGNOSIS — R011 Cardiac murmur, unspecified: Secondary | ICD-10-CM

## 2022-06-24 DIAGNOSIS — Z720 Tobacco use: Secondary | ICD-10-CM

## 2022-06-24 LAB — BASIC METABOLIC PANEL
Anion gap: 7 (ref 5–15)
BUN: 10 mg/dL (ref 6–20)
CO2: 26 mmol/L (ref 22–32)
Calcium: 8.7 mg/dL — ABNORMAL LOW (ref 8.9–10.3)
Chloride: 105 mmol/L (ref 98–111)
Creatinine, Ser: 0.68 mg/dL (ref 0.61–1.24)
GFR, Estimated: >60 mL/min (ref >60)
Glucose, Bld: 106 mg/dL — ABNORMAL HIGH (ref 70–99)
Potassium: 3.7 mmol/L (ref 3.5–5.1)
Sodium: 138 mmol/L (ref 135–145)

## 2022-06-24 LAB — ECHOCARDIOGRAM COMPLETE
AR max vel: 3.14 cm2
AV Area VTI: 3.78 cm2
AV Area mean vel: 3.39 cm2
AV Mean grad: 3 mmHg
AV Peak grad: 6.5 mmHg
Ao pk vel: 1.27 m/s
Area-P 1/2: 3.27 cm2
Calc EF: 62.9 %
S' Lateral: 2.9 cm
Single Plane A2C EF: 63.9 %
Single Plane A4C EF: 61.3 %

## 2022-06-24 LAB — CBC WITH DIFFERENTIAL/PLATELET
Abs Immature Granulocytes: 0.02 10*3/uL (ref 0.00–0.07)
Basophils Absolute: 0 10*3/uL (ref 0.0–0.1)
Basophils Relative: 0 %
Eosinophils Absolute: 0 10*3/uL (ref 0.0–0.5)
Eosinophils Relative: 0 %
HCT: 39.1 % (ref 39.0–52.0)
Hemoglobin: 13.4 g/dL (ref 13.0–17.0)
Immature Granulocytes: 0 %
Lymphocytes Relative: 22 %
Lymphs Abs: 2 10*3/uL (ref 0.7–4.0)
MCH: 30.5 pg (ref 26.0–34.0)
MCHC: 34.3 g/dL (ref 30.0–36.0)
MCV: 89.1 fL (ref 80.0–100.0)
Monocytes Absolute: 0.7 10*3/uL (ref 0.1–1.0)
Monocytes Relative: 8 %
Neutro Abs: 6.2 10*3/uL (ref 1.7–7.7)
Neutrophils Relative %: 70 %
Platelets: 279 10*3/uL (ref 150–400)
RBC: 4.39 MIL/uL (ref 4.22–5.81)
RDW: 12.5 % (ref 11.5–15.5)
WBC: 9 10*3/uL (ref 4.0–10.5)
nRBC: 0 % (ref 0.0–0.2)

## 2022-06-24 LAB — LACTIC ACID, PLASMA
Lactic Acid, Venous: 1.1 mmol/L (ref 0.5–1.9)
Lactic Acid, Venous: 2.2 mmol/L (ref 0.5–1.9)

## 2022-06-24 MED ORDER — ACETAMINOPHEN 500 MG PO TABS
1000.0000 mg | ORAL_TABLET | Freq: Once | ORAL | Status: AC
  Administered 2022-06-24: 1000 mg via ORAL
  Filled 2022-06-24: qty 2

## 2022-06-24 MED ORDER — VANCOMYCIN HCL 1250 MG/250ML IV SOLN
1250.0000 mg | Freq: Two times a day (BID) | INTRAVENOUS | Status: DC
  Administered 2022-06-24: 1250 mg via INTRAVENOUS
  Filled 2022-06-24 (×3): qty 250

## 2022-06-24 MED ORDER — QUETIAPINE FUMARATE 25 MG PO TABS
50.0000 mg | ORAL_TABLET | Freq: Every day | ORAL | Status: DC
  Administered 2022-06-24: 50 mg via ORAL
  Filled 2022-06-24: qty 2

## 2022-06-24 MED ORDER — ENOXAPARIN SODIUM 40 MG/0.4ML IJ SOSY
40.0000 mg | PREFILLED_SYRINGE | INTRAMUSCULAR | Status: DC
  Administered 2022-06-24 – 2022-06-25 (×2): 40 mg via SUBCUTANEOUS
  Filled 2022-06-24 (×2): qty 0.4

## 2022-06-24 MED ORDER — NICOTINE 21 MG/24HR TD PT24
21.0000 mg | MEDICATED_PATCH | Freq: Every day | TRANSDERMAL | Status: DC
  Administered 2022-06-24 – 2022-06-25 (×2): 21 mg via TRANSDERMAL
  Filled 2022-06-24 (×2): qty 1

## 2022-06-24 MED ORDER — QUETIAPINE FUMARATE 25 MG PO TABS
25.0000 mg | ORAL_TABLET | Freq: Every day | ORAL | Status: DC
  Administered 2022-06-24 – 2022-06-25 (×2): 25 mg via ORAL
  Filled 2022-06-24 (×2): qty 1

## 2022-06-24 MED ORDER — SODIUM CHLORIDE 0.9 % IV SOLN
2.0000 g | INTRAVENOUS | Status: DC
  Administered 2022-06-24: 2 g via INTRAVENOUS
  Filled 2022-06-24: qty 20

## 2022-06-24 MED ORDER — SODIUM CHLORIDE 0.9 % IV BOLUS
1000.0000 mL | Freq: Once | INTRAVENOUS | Status: AC
  Administered 2022-06-24: 1000 mL via INTRAVENOUS

## 2022-06-24 NOTE — ED Notes (Addendum)
Rounded on patient, found patient had broken the IV tubing. Patient stated "I think I have a problem" and giggled. Patient had also removed all monitoring equipment.

## 2022-06-24 NOTE — Assessment & Plan Note (Addendum)
Patient left AMA about 12 hours prior to this re-admission in which he was initially meeting SIRS criteria. Patient found to be COVID positive but also noted to have systolic murmur and history of IVDU. Patient had blood cultures drawn on last admission and was started on antibiotics, which were to be continued pending culture and echo results but patient left AMA prior to imagine studies. Reassuringly has no oxygen requirement and will defer antiviral treatment but will restart antibiotics as we cannot rule out the possible endocarditis (discussed with pharmacy who recommended treatment with vanc and ceftriaxone).  - Admit to FMTS, observation, attending Dr. Ardelia Mems - Contact and airborne precautions - Vitals per routine - Follow prior blood culture - Echocardiogram - Ceftriaxone per pharmacy  - Vanc per pharmacy

## 2022-06-24 NOTE — Discharge Summary (Cosign Needed)
[facility] Discharge Summary  Patient name: Joseph Nunez record number: [id number] Date of birth: [birth year] Age: 27 y.o. Gender: male Date of Admission: 06/23/2022  Date of Discharge: 06/25/22 Admitting Physician: Leeanne Rio, MD  Primary Care Provider: Pcp, No Consultants: None  Indication for Hospitalization: SIRS  Discharge Diagnoses/Problem List:  Principal Problem for Admission: SIRS Other Problems addressed during stay:  Principal Problem:   SIRS (systemic inflammatory response syndrome) ([place]) Active Problems:   COVID-19 virus infection   Bipolar I disorder ([place])   Tobacco abuse   Brief Hospital Course:  [facility] [name] is a 27 y.o.male with a history of polysubstance use including IVDU and bipolar disorder  who was admitted to the [facility] Medicine Teaching Service at [facility] for hypothermia and rule-out sepsis/endocarditis. His hospital course is detailed below:  Hypothermia  rule-out sepsis Patient admitted after recently on 06/22/22 meeting SIRS criteria, after sleeping outside for the night. He was initially hypothermic to 93.4 farenheit, and warmed with a bear hugger in ED. His labs and vitals were deranged with lactate 3.8 and WBC 14.4, and RR 25 and BP 90's/50's, and Covid positive. A systolic murmur was heard on admission and patient was to receive work up for SIRS/endocarditis. Patient left AMA, before SIRS/endocarditis work up could be completed. He then returned 1 hour later after smoking a cigarette. Patient had blood cultures drawn on last admission and was started on antibiotics, which were to be continued pending culture and echo results but patient left AMA prior to imagine studies. Patient was started on vancomycin and ceftriaxone previously and continued upon readmission to the hospital. Patient's vitals remained stable during the admission. ECHO on 2/4 was unremarkable for any findings, and blood  cultures were negative x 48 hrs. SIRS most likely due to hypothermia when patient initially arrived from sleeping outside in the cold.    Other chronic conditions were medically managed with home medications and formulary alternatives as necessary (seroquel)  PCP Follow-up Recommendations: Bipolar disorder medications  Access to medications - patient has not been taking many of his medications for some time  IVDU cessation   Disposition: Home  Discharge Condition: Improved  Discharge Exam:  Vitals:   06/24/22 1921 06/25/22 0739  BP: 111/68 104/74  Pulse: 86 97  Resp: 18 18  Temp: 97.6 F (36.4 C) 98.2 F (36.8 C)  SpO2: 100% 100%   None  Significant Procedures: None  Significant Labs and Imaging:  Recent Labs  Lab 06/24/22 0149  WBC 9.0  HGB 13.4  HCT 39.1  PLT 279   Recent Labs  Lab 06/24/22 0149  NA 138  K 3.7  CL 105  CO2 26  GLUCOSE 106*  BUN 10  CREATININE 0.68  CALCIUM 8.7*    No results found.   Results/Tests Pending at Time of Discharge: None  Discharge Medications:  Allergies as of 06/25/2022       Reactions   Fish Allergy Hives   Catfish   Suboxone [buprenorphine Hcl-naloxone Hcl] Anaphylaxis   Penicillins Rash   Has patient had a PCN reaction causing immediate rash, facial/tongue/throat swelling, SOB or lightheadedness with hypotension:yHas patient had a PCN reace cassing severe rash involving mucus membranes or skin necrosis: no Has patient had a PCN reaction that required hospitalization yes Has patient had a PCN reaction occurring within the last 10 years: no If all of the above answers are "NO", then may proceed with Cephalosporin use.  Medication List     TAKE these medications    amphetamine-dextroamphetamine 10 MG 24 hr capsule Commonly known as: ADDERALL XR Take 10 mg by mouth daily.   hydrOXYzine 25 MG capsule Commonly known as: VISTARIL Take 25 mg by mouth 3 (three) times daily as needed for anxiety.    ibuprofen 200 MG tablet Commonly known as: ADVIL Take 600 mg by mouth as needed for headache or moderate pain.   propranolol 10 MG tablet Commonly known as: INDERAL Take 10 mg by mouth 3 (three) times daily.   QUEtiapine 50 MG tablet Commonly known as: SEROQUEL Take 1 tablet (50 mg total) by mouth at bedtime.   QUEtiapine 25 MG tablet Commonly known as: SEROQUEL Take 1 tablet (25 mg total) by mouth daily.   thiamine 100 MG tablet Commonly known as: Vitamin B-1 Take 1 tablet (100 mg total) by mouth daily.        Discharge Instructions: Please refer to Patient Instructions section of EMR for full details.  Patient was counseled important signs and symptoms that should prompt return to medical care, changes in medications, dietary instructions, activity restrictions, and follow up appointments.   Follow-Up Appointments:  Follow-up Information     [place]. Schedule an appointment as soon as possible for a visit.   Why: Call to schedule/establish to be followed by a doctor Contact information: [place] [phone]                Arlyce Dice, MD 06/25/2022, 2:46 PM PGY-1, [place]

## 2022-06-24 NOTE — Hospital Course (Addendum)
[facility] Joseph Nunez is a 27 y.o.male with a history of polysubstance use including IVDU and bipolar disorder  who was admitted to the [facility] Medicine Teaching Service at [facility] for hypothermia and rule-out sepsis/endocarditis. His hospital course is detailed below:  Hypothermia  rule-out sepsis Patient admitted after recently on 06/22/22 meeting SIRS criteria, after sleeping outside for the night. He was initially hypothermic to 93.4 farenheit, and warmed with a bear hugger in ED. His labs and vitals were deranged with lactate 3.8 and WBC 14.4, and RR 25 and BP 90's/50's, and Covid positive. A systolic murmur was heard on admission and patient was to receive work up for SIRS/endocarditis. Patient left AMA, before SIRS/endocarditis work up could be completed. He then returned 12 hours later. Patient had blood cultures drawn on last admission and was started on antibiotics, which were to be continued pending culture and echo results but patient left AMA prior to imagine studies. Patient was started on vancomycin and ceftriaxone previously and continued upon readmission to the hospital. Patient's vitals remained stable during the admission. ECHO on 2/4 was unremarkable for any findings, and blood cultures were negative x 48 hrs. SIRS most likely due to hypothermia when patient initially arrived from sleeping outside in the cold.    Other chronic conditions were medically managed with home medications and formulary alternatives as necessary (seroquel)  PCP Follow-up Recommendations: Bipolar disorder medications  Access to medications - patient has not been taking many of his medications for some time  IVDU cessation

## 2022-06-24 NOTE — ED Notes (Signed)
ED TO INPATIENT HANDOFF REPORT  ED Nurse Name and Phone #: Mosie Lukes RN   S Name/Age/Gender Joseph Nunez 27 y.o. male Room/Bed: 035C/035C  Code Status   Code Status: Full Code  Home/SNF/Other Home Patient oriented to: self, place, time, and situation Is this baseline? Yes   Triage Complete: Triage complete  Chief Complaint SIRS (systemic inflammatory response syndrome) (HCC) [R65.10]  Triage Note No notes on file   Allergies Allergies  Allergen Reactions   Fish Allergy Hives    Catfish   Suboxone [Buprenorphine Hcl-Naloxone Hcl] Anaphylaxis    Level of Care/Admitting Diagnosis ED Disposition     ED Disposition  Admit   Condition  --   [facility] Area: [place] [[id number]]  Level of Care: Med-Surg [16]  May place patient in observation at [facility] or [place] if equivalent level of care is available:: No  Covid Evaluation: Confirmed COVID Positive  Diagnosis: SIRS (systemic inflammatory response syndrome) [facility]) [[id number]]  Admitting Physician: Leeanne Rio [id number]  Attending Physician: Leeanne Rio [id number]          B Medical/Surgery History Past Medical History:  Diagnosis Date   Cocaine abuse ([place])    Heroin abuse ([place])    IVDU (intravenous drug user)    MRSA (methicillin resistant staph aureus) culture positive    Substance abuse ([place])    Tobacco abuse    Past Surgical History:  Procedure Laterality Date   IRRIGATION AND DEBRIDEMENT ABSCESS N/A 03/08/2017   Procedure: IRRIGATION AND DEBRIDEMENT LEFT CHEST ABSCESS;  Surgeon: Michael Boston, MD;  Location: [facility] ORS;  Service: General;  Laterality: N/A;   TEE WITHOUT CARDIOVERSION N/A 03/08/2017   Procedure: TRANSESOPHAGEAL ECHOCARDIOGRAM (TEE);  Surgeon: Skeet Latch, MD;  Location: [facility] ENDOSCOPY;  Service: Cardiovascular;  Laterality: N/A;     A IV Location/Drains/Wounds Patient Lines/Drains/Airways Status     Active  Line/Drains/Airways     Name Placement date Placement time Site Days   Peripheral IV 06/24/22 20 G Anterior;Distal;Right;Upper Arm 06/24/22  0156  Arm  less than 1            Intake/Output Last 24 hours No intake or output data in the 24 hours ending 06/24/22 0432  Labs/Imaging Results for orders placed or performed during the hospital encounter of 06/23/22 (from the past 48 hour(s))  Lactic acid, plasma     Status: None   Collection Time: 06/24/22  1:49 AM  Result Value Ref Range   Lactic Acid, Venous 1.1 0.5 - 1.9 mmol/L    Comment: Performed at [facility], [place] [address] [place], [place] [address]  CBC with Differential     Status: None   Collection Time: 06/24/22  1:49 AM  Result Value Ref Range   WBC 9.0 4.0 - 10.5 K/uL   RBC 4.39 4.22 - 5.81 MIL/uL   Hemoglobin 13.4 13.0 - 17.0 g/dL   HCT 39.1 39.0 - 52.0 %   MCV 89.1 80.0 - 100.0 fL   MCH 30.5 26.0 - 34.0 pg   MCHC 34.3 30.0 - 36.0 g/dL   RDW 12.5 11.5 - 15.5 %   Platelets 279 150 - 400 K/uL   nRBC 0.0 0.0 - 0.2 %   Neutrophils Relative % 70 %   Neutro Abs 6.2 1.7 - 7.7 K/uL   Lymphocytes Relative 22 %   Lymphs Abs 2.0 0.7 - 4.0 K/uL   Monocytes Relative 8 %   Monocytes Absolute  0.7 0.1 - 1.0 K/uL   Eosinophils Relative 0 %   Eosinophils Absolute 0.0 0.0 - 0.5 K/uL   Basophils Relative 0 %   Basophils Absolute 0.0 0.0 - 0.1 K/uL   Immature Granulocytes 0 %   Abs Immature Granulocytes 0.02 0.00 - 0.07 K/uL    Comment: Performed at [facility], [place] [address] [place], [place] [address]  Basic metabolic panel     Status: Abnormal   Collection Time: 06/24/22  1:49 AM  Result Value Ref Range   Sodium 138 135 - 145 mmol/L   Potassium 3.7 3.5 - 5.1 mmol/L   Chloride 105 98 - 111 mmol/L   CO2 26 22 - 32 mmol/L   Glucose, Bld 106 (H) 70 - 99 mg/dL    Comment: Glucose reference range applies only to samples taken after fasting for at least 8 hours.   BUN 10 6 - 20 mg/dL   Creatinine, Ser 0.68  0.61 - 1.24 mg/dL   Calcium 8.7 (L) 8.9 - 10.3 mg/dL   GFR, Estimated >60 >60 mL/min    Comment: (NOTE) Calculated using the CKD-EPI Creatinine Equation (2021)    Anion gap 7 5 - 15    Comment: Performed at [place] [address], [place], [place] [address]   CT Head Wo Contrast  Result Date: 06/22/2022 CLINICAL DATA:  Headache, fever EXAM: CT HEAD WITHOUT CONTRAST TECHNIQUE: Contiguous axial images were obtained from the base of the skull through the vertex without intravenous contrast. RADIATION DOSE REDUCTION: This exam was performed according to the departmental dose-optimization program which includes automated exposure control, adjustment of the mA and/or kV according to patient size and/or use of iterative reconstruction technique. COMPARISON:  CT head 02/18/2022 FINDINGS: Brain: No evidence of large-territorial acute infarction. No parenchymal hemorrhage. No mass lesion. No extra-axial collection. No mass effect or midline shift. No hydrocephalus. Basilar cisterns are patent. Vascular: No hyperdense vessel. Skull: No acute fracture or focal lesion. Sinuses/Orbits: Paranasal sinuses and mastoid air cells are clear. The orbits are unremarkable. Other: None. IMPRESSION: No acute intracranial abnormality. Electronically Signed   By: Iven Finn M.D.   On: 06/22/2022 19:47   DG Foot Complete Left  Result Date: 06/22/2022 CLINICAL DATA:  Bilateral feet EXAM: LEFT FOOT - COMPLETE 3 VIEW; RIGHT FOOT COMPLETE - 3 VIEW COMPARISON:  None Available. FINDINGS: Right foot: There is no evidence of fracture or dislocation. There is no evidence of arthropathy or other focal bone abnormality. Soft tissues are unremarkable. Left foot: There is no evidence of fracture or dislocation. There is no evidence of arthropathy or other focal bone abnormality. Soft tissues are unremarkable. IMPRESSION: Negative. Electronically Signed   By: Yetta Glassman M.D.   On: 06/22/2022 17:23   DG Foot Complete  Right  Result Date: 06/22/2022 CLINICAL DATA:  Bilateral feet EXAM: LEFT FOOT - COMPLETE 3 VIEW; RIGHT FOOT COMPLETE - 3 VIEW COMPARISON:  None Available. FINDINGS: Right foot: There is no evidence of fracture or dislocation. There is no evidence of arthropathy or other focal bone abnormality. Soft tissues are unremarkable. Left foot: There is no evidence of fracture or dislocation. There is no evidence of arthropathy or other focal bone abnormality. Soft tissues are unremarkable. IMPRESSION: Negative. Electronically Signed   By: Yetta Glassman M.D.   On: 06/22/2022 17:23   DG Wrist Complete Right  Result Date: 06/22/2022 CLINICAL DATA:  Wrist pain, swelling EXAM: RIGHT WRIST - COMPLETE 3+ VIEW COMPARISON:  None  Available. FINDINGS: No acute bony abnormality. Specifically, no fracture, subluxation, or dislocation. Mild diffuse soft tissue swelling. Joint spaces maintained. IMPRESSION: No acute bony abnormality. Electronically Signed   By: Rolm Baptise M.D.   On: 06/22/2022 11:20   DG Chest Port 1 View  Result Date: 06/22/2022 CLINICAL DATA:  Questionable sepsis EXAM: PORTABLE CHEST 1 VIEW COMPARISON:  02/22/2022 FINDINGS: The heart size and mediastinal contours are within normal limits. Both lungs are clear. The visualized skeletal structures are unremarkable. IMPRESSION: No active disease. Electronically Signed   By: Kathreen Devoid M.D.   On: 06/22/2022 10:21   DG Hand Complete Left  Result Date: 06/22/2022 CLINICAL DATA:  Soft tissue swelling EXAM: LEFT HAND - COMPLETE 3+ VIEW COMPARISON:  None Available. FINDINGS: There is no evidence of fracture or dislocation. There is no evidence of arthropathy or other focal bone abnormality. Mild soft tissue swelling of the hand. IMPRESSION: Mild soft tissue swelling of the hand. No acute osseous abnormality. Electronically Signed   By: Keane Police D.O.   On: 06/22/2022 10:21   DG Hand Complete Right  Result Date: 06/22/2022 CLINICAL DATA:  Swelling evaluate  for abnormality EXAM: RIGHT HAND - COMPLETE 3+ VIEW COMPARISON:  None Available. FINDINGS: There is no evidence of fracture or dislocation. There is no evidence of arthropathy or other focal bone abnormality. Generalized soft tissue swelling of the hand. IMPRESSION: 1. No acute fracture or dislocation. 2. Generalized soft tissue swelling of the hand. Electronically Signed   By: Keane Police D.O.   On: 06/22/2022 10:20    Pending Labs Unresulted Labs (From admission, onward)     Start     Ordered   06/25/22 0500  Comprehensive metabolic panel  Tomorrow morning,   R        06/24/22 0307   06/25/22 0500  CBC  Tomorrow morning,   R        06/24/22 0307   06/24/22 0259  Rapid urine drug screen (hospital performed)  ONCE - STAT,   STAT        06/24/22 0258   06/24/22 0015  Lactic acid, plasma  Now then every 2 hours,   R (with STAT occurrences)      06/24/22 0014            Vitals/Pain Today's Vitals   06/24/22 0000 06/24/22 0030 06/24/22 0200 06/24/22 0431  BP: 122/82 (!) 114/54 (!) 123/103 127/73  Pulse: 95 91 (!) 113 100  Resp:    18  Temp:    97.6 F (36.4 C)  TempSrc:      SpO2: 99% 98% 100% 99%    Isolation Precautions No active isolations  Medications Medications  enoxaparin (LOVENOX) injection 40 mg (has no administration in time range)  nicotine (NICODERM CQ - dosed in mg/24 hours) patch 21 mg (has no administration in time range)  cefTRIAXone (ROCEPHIN) 2 g in sodium chloride 0.9 % 100 mL IVPB (has no administration in time range)  vancomycin (VANCOREADY) IVPB 1250 mg/250 mL (has no administration in time range)  acetaminophen (TYLENOL) tablet 1,000 mg (1,000 mg Oral Given 06/24/22 0037)  sodium chloride 0.9 % bolus 1,000 mL (1,000 mLs Intravenous New Bag/Given 06/24/22 0222)    Mobility walks     Focused Assessments Cardiac Assessment Handoff:    Lab Results  Component Value Date   CKTOTAL 227 06/22/2022   No results found for: "DDIMER" Does the Patient  currently have chest pain? No    R Recommendations:  See Admitting Provider Note  Report given to:   Additional Notes:

## 2022-06-24 NOTE — ED Notes (Signed)
Patient removed monitoring equipment, reattached equipment, patient verbalized understanding of need.

## 2022-06-24 NOTE — Progress Notes (Addendum)
Pharmacy Antibiotic Note  [facility] [name] is a 27 y.o. male admitted on 06/23/2022 presenting with arm pain and swelling as well as foot pain, hx IVDU, increased LA.  Pharmacy has been consulted for vancomycin and cefepime dosing.  Pt left AMA yesterday. He is back with suspected sepsis. He is also positive for COVID.  Scr 0.68  Plan: Vancomycin 1250mg  IV q12>>AUC 468, scr 0.8 Ceftriaxone 2g IV q24 Monitor renal function, Cx and clinical progression to narrow Vancomycin levels as indicated     Temp (24hrs), Avg:98.5 F (36.9 C), Min:97.2 F (36.2 C), Max:99.4 F (37.4 C)  Recent Labs  Lab 06/22/22 1139 06/22/22 1141 06/22/22 1430 06/22/22 1936 06/22/22 2111 06/24/22 0149  WBC 14.4*  --   --   --   --  9.0  CREATININE  --   --  0.76  --  0.79 0.68  LATICACIDVEN  --  3.4* 3.8* 2.2*  --  1.1     Estimated Creatinine Clearance: 126.8 mL/min (by C-G formula based on SCr of 0.68 mg/dL).    Allergies  Allergen Reactions   Fish Allergy Hives    Catfish   Suboxone [Buprenorphine Hcl-Naloxone Hcl] Anaphylaxis    2/3 blood cx>>ngtd   2/2 vanc>> 2/2 cefepime>>2/3 2/4 ceftriaxone>>  Onnie Boer, PharmD, BCIDP, AAHIVP, CPP Infectious Disease Pharmacist 06/24/2022 4:13 AM

## 2022-06-24 NOTE — ED Notes (Signed)
Patient repeatedly removes monitoring equipment.

## 2022-06-24 NOTE — ED Notes (Signed)
MD in room with patient for evaluation.

## 2022-06-24 NOTE — ED Notes (Signed)
Admitting MD made aware of patient refusing to wear monitoring equipment.

## 2022-06-24 NOTE — Progress Notes (Signed)
  Echocardiogram 2D Echocardiogram has been performed.  Joseph Nunez 06/24/2022, 1:39 PM

## 2022-06-24 NOTE — Progress Notes (Signed)
Patient refuses to urinate in urinal or hat. Unable to collect specimen for UDS

## 2022-06-24 NOTE — H&P (Signed)
Hospital Admission History and Physical Service Pager: [phone]  [facility] name: [facility] Joseph Nunez Medical record number: [id number] Date of Birth: Jun 04, 1995 Age: 27 y.o. Gender: male  Primary Care Provider: Pcp, No Consultants: None Code Status: Full Preferred Emergency Contact:   Name Relation Home Work [place] Mother [phone]     Chief Complaint: Headche  Assessment and Plan: [facility] Joseph Nunez is a 27 y.o. male with history of polysubstance use including IVDU and bipolar disorder that is presenting after leaving AMA during work-up for SIRS and possible endocarditis.   * SIRS (systemic inflammatory response syndrome) (HCC) Patient left AMA about 12 hours prior to this re-admission in which he was initially meeting SIRS criteria. Patient found to be COVID positive but also noted to have systolic murmur and history of IVDU. Patient had blood cultures drawn on last admission and was started on antibiotics, which were to be continued pending culture and echo results but patient left AMA prior to imagine studies. Reassuringly has no oxygen requirement and will defer antiviral treatment but will restart antibiotics as we cannot rule out the possible endocarditis (discussed with pharmacy who recommended treatment with vanc and ceftriaxone).  - Admit to FMTS, observation, attending Dr. Ardelia Mems - Contact and airborne precautions - Vitals per routine - Follow prior blood culture - Echocardiogram - Ceftriaxone per pharmacy  - Vanc per pharmacy  COVID-19 virus infection Positive for COVID 2 days ago with recent URI symptoms. No oxygen requirement at this time, will defer antiviral treatment. - Airborne and contact precautions  Bipolar I disorder ([place]) Not on chronic medications at this time.  - Consider restarting seroquel 25mg  daily and 50mg  QHS    Chronic conditions Polysubstance use/IVDU  Chronic HCV - consider outpatient ID  referral Tobacco use - Nicoderm patch prescribed   FEN/GI: Regular VTE Prophylaxis: Lovenox  Disposition: Med-surg obs  History of Present Illness:  [facility] Joseph Nunez is a 27 y.o. male presenting with headache.  Patient was admitted on 2/2 initially due to meeting SIRS criteria with concern for possible endocarditis given systolic murmur and IVDU with history of drug use in 2019. Patient left AMA after about 24 hours of hospitalization prior to having the echocardiogram completed. Patient presented again today due to his headache per ER documentation.   Patient not cooperative with history or examination. When asked any questions about symptoms patient responded with "yes", when asked if he was going to respond yes to anything I asked he said "yes". When asking patient's DOB he reported being born in 2024 and would not answer further.   In the ED, patient had labs drawn which have largely been normal including lactic acid and WBC. Patient was placed on cardiac monitors but pulls them off. He was placed on IVF but interferes with the line so that it is not running. Patient had improvement in his headache after some fluids and treatment with Tylenol. Given the concern for the endocarditis (for which patient left AMA prior to evaluation), call was placed for admission.   Review Of Systems: Per HPI with the following additions: none  Pertinent Past Medical History: Chronic Hep C Bipolar Disorder Polysubstance abuse - including tobacco and IVDU Remainder reviewed in history tab.   Pertinent Past Surgical History: Scar tissue removal  Remainder reviewed in history tab.   Pertinent Social History: Tobacco use: Yes (reports 1PPD) Alcohol use: no Other Substance use: cocaine in the last week Lives with  mother (per chart review, patient uncooperative during exam)  Pertinent Family History: None Remainder reviewed in history tab.   Important Outpatient  Medications: Hydroxyzine Seroquel Remainder reviewed in medication history.   Objective: BP (!) 123/103   Pulse (!) 113   Temp (!) 97.2 F (36.2 C) (Oral)   Resp 16   SpO2 100%  Exam: General -- not cooperative with exam, disheveled appearance, eating crackers during entire encounter, apathetic  HEENT -- Head is normocephalic. EOMI. Ears, nose and throat were benign. Neck -- supple; no bruits. Integument -- intact. No rash, erythema, or ecchymoses. Subungal hematoma on left 5th fingernail Chest -- good expansion. Lungs clear to auscultation. Cardiac -- RRR on exam. Systolic murmur  Abdomen -- soft, non-distended, non-tender Extremities - good ROM, patient able to ambulate in the room. Patient not compliant with taking off his socks for examination   Labs:  CBC BMET  Recent Labs  Lab 06/24/22 0149  WBC 9.0  HGB 13.4  HCT 39.1  PLT 279   Recent Labs  Lab 06/24/22 0149  NA 138  K 3.7  CL 105  CO2 26  BUN 10  CREATININE 0.68  GLUCOSE 106*  CALCIUM 8.7*    Pertinent additional labs: - Lactic acid 1.1   EKG: pending EKG. Prior one collected on 2/2 with NSR   Imaging Studies Performed:  No new imaging studies since 2/2 admission evaluation.   Rise Patience, DO 06/24/2022, 4:05 AM PGY-3, [place] Intern pager: [phone], text pages welcome Secure chat group [place]

## 2022-06-24 NOTE — Assessment & Plan Note (Signed)
Not on chronic medications at this time.  - Consider restarting seroquel 25mg  daily and 50mg  QHS

## 2022-06-24 NOTE — Progress Notes (Signed)
FMTS Interim Progress Note  S:Went bedside to see patient. Patient sitting comfortably in room. Complains feet are still tender.   O: BP 120/77   Pulse 97   Temp 98.6 F (37 C) (Oral)   Resp 18   SpO2 100%   Gen: NAD, laying in bed comfortably, cooperative Card: RRR, NRMG Pulm: CTABL, no wheezes, rhonchi, or stridor Abd: Soft, NTTP, non-distended Extremities: Moving all independently  A/P: Endocarditis r/o Patient comes in 2nd time, after being admitted for sepsis concern/possible endocarditis work up. Patient initial presentation may have been 2/2 hypothermia, but murmur and hx of IV drug use concerning for endocarditis. Patient recent echo was normal, thus no concern for endocarditis at this time. Patient looking well and medically stable for d/c.  Holley Bouche, MD 06/24/2022, 4:33 PM PGY-2, [facility] pager [phone]

## 2022-06-24 NOTE — Assessment & Plan Note (Signed)
Positive for COVID 2 days ago with recent URI symptoms. No oxygen requirement at this time, will defer antiviral treatment. - Airborne and contact precautions

## 2022-06-24 NOTE — Discharge Instructions (Addendum)
Dear [facility],  Thank you for letting us participate in your care. You were hospitalized for sick symptoms with concern of an infection in your heart. You were diagnosed with SIRS (systemic inflammatory response syndrome) ([place]) when you were admitted, and that has since resolved. Your imaging came back negative for sings of an infection in your heart. You are however Covid positive.    POST-HOSPITAL & CARE INSTRUCTIONS Watch out for Hypothermia, if you sleep outside, it can cause pain and swelling in your body. It also can cause organ dysfunction/ lead to organ failure.  Please wear a mask for 10 days as you had COVID +   DOCTOR'S APPOINTMENT   No future appointments.  Follow-up Information     [place]. Schedule an appointment as soon as possible for a visit.   Why: Call to schedule/establish to be followed by a doctor Contact information: [address] [place] [address] [phone]                Take care and be well!  [facility]  [place], [place] [address] [phone]

## 2022-06-25 ENCOUNTER — Emergency Department (HOSPITAL_COMMUNITY)
Admission: EM | Admit: 2022-06-25 | Discharge: 2022-06-26 | Disposition: A | Discharge status: Home / Self Care | Payer: Self-pay | Attending: Emergency Medicine | Admitting: Emergency Medicine

## 2022-06-25 ENCOUNTER — Other Ambulatory Visit: Payer: Self-pay

## 2022-06-25 ENCOUNTER — Encounter (HOSPITAL_COMMUNITY): Payer: Self-pay | Admitting: Emergency Medicine

## 2022-06-25 ENCOUNTER — Emergency Department (HOSPITAL_COMMUNITY)
Admission: EM | Admit: 2022-06-25 | Discharge: 2022-06-25 | Disposition: A | Discharge status: Home / Self Care | Payer: Self-pay | Attending: Emergency Medicine | Admitting: Emergency Medicine

## 2022-06-25 DIAGNOSIS — S60152D Contusion of left little finger with damage to nail, subsequent encounter: Secondary | ICD-10-CM | POA: Insufficient documentation

## 2022-06-25 DIAGNOSIS — R209 Unspecified disturbances of skin sensation: Secondary | ICD-10-CM | POA: Insufficient documentation

## 2022-06-25 DIAGNOSIS — U071 COVID-19: Secondary | ICD-10-CM | POA: Insufficient documentation

## 2022-06-25 DIAGNOSIS — J3489 Other specified disorders of nose and nasal sinuses: Secondary | ICD-10-CM | POA: Insufficient documentation

## 2022-06-25 DIAGNOSIS — F319 Bipolar disorder, unspecified: Secondary | ICD-10-CM

## 2022-06-25 DIAGNOSIS — M25512 Pain in left shoulder: Secondary | ICD-10-CM | POA: Insufficient documentation

## 2022-06-25 DIAGNOSIS — R6510 Systemic inflammatory response syndrome (SIRS) of non-infectious origin without acute organ dysfunction: Secondary | ICD-10-CM

## 2022-06-25 LAB — MRSA NEXT GEN BY PCR, NASAL: MRSA by PCR Next Gen: DETECTED — AB

## 2022-06-25 MED ORDER — IBUPROFEN 200 MG PO TABS
600.0000 mg | ORAL_TABLET | Freq: Four times a day (QID) | ORAL | Status: DC | PRN
  Administered 2022-06-25: 600 mg via ORAL
  Filled 2022-06-25: qty 3
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
## 2022-06-25 NOTE — Progress Notes (Signed)
CSW provided RN with bus passes to give patient to assist with transportation.  Madilyn Fireman, MSW, LCSW Transitions of Care  Clinical Social Worker II [phone]
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
## 2022-06-25 NOTE — ED Notes (Signed)
Patient went out to the lobby and told registration this is not the hospital he wanted to come to, currently eating a slice of pizza.
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
## 2022-06-26 ENCOUNTER — Emergency Department (HOSPITAL_COMMUNITY)
Admission: EM | Admit: 2022-06-26 | Discharge: 2022-06-26 | Disposition: A | Discharge status: Home / Self Care | Payer: Self-pay | Attending: Emergency Medicine | Admitting: Emergency Medicine

## 2022-06-26 ENCOUNTER — Other Ambulatory Visit: Payer: Self-pay

## 2022-06-26 ENCOUNTER — Emergency Department (HOSPITAL_COMMUNITY)
Admission: EM | Admit: 2022-06-26 | Discharge: 2022-06-27 | Disposition: A | Discharge status: Home / Self Care | Payer: Self-pay | Attending: Emergency Medicine | Admitting: Emergency Medicine

## 2022-06-26 ENCOUNTER — Emergency Department (HOSPITAL_COMMUNITY): Payer: Self-pay

## 2022-06-26 DIAGNOSIS — Z8616 Personal history of COVID-19: Secondary | ICD-10-CM | POA: Insufficient documentation

## 2022-06-26 DIAGNOSIS — R0789 Other chest pain: Secondary | ICD-10-CM | POA: Insufficient documentation

## 2022-06-26 DIAGNOSIS — F17200 Nicotine dependence, unspecified, uncomplicated: Secondary | ICD-10-CM | POA: Insufficient documentation

## 2022-06-26 DIAGNOSIS — R0602 Shortness of breath: Secondary | ICD-10-CM | POA: Insufficient documentation

## 2022-06-26 DIAGNOSIS — K449 Diaphragmatic hernia without obstruction or gangrene: Secondary | ICD-10-CM | POA: Insufficient documentation

## 2022-06-26 LAB — CBC WITH DIFFERENTIAL/PLATELET
Abs Immature Granulocytes: 0.03 10*3/uL (ref 0.00–0.07)
Basophils Absolute: 0 10*3/uL (ref 0.0–0.1)
Basophils Relative: 0 %
Eosinophils Absolute: 0.1 10*3/uL (ref 0.0–0.5)
Eosinophils Relative: 1 %
HCT: 46.4 % (ref 39.0–52.0)
Hemoglobin: 15.5 g/dL (ref 13.0–17.0)
Immature Granulocytes: 0 %
Lymphocytes Relative: 19 %
Lymphs Abs: 1.8 10*3/uL (ref 0.7–4.0)
MCH: 30 pg (ref 26.0–34.0)
MCHC: 33.4 g/dL (ref 30.0–36.0)
MCV: 89.7 fL (ref 80.0–100.0)
Monocytes Absolute: 0.4 10*3/uL (ref 0.1–1.0)
Monocytes Relative: 5 %
Neutro Abs: 6.9 10*3/uL (ref 1.7–7.7)
Neutrophils Relative %: 75 %
Platelets: 352 10*3/uL (ref 150–400)
RBC: 5.17 MIL/uL (ref 4.22–5.81)
RDW: 12.5 % (ref 11.5–15.5)
WBC: 9.2 10*3/uL (ref 4.0–10.5)
nRBC: 0 % (ref 0.0–0.2)

## 2022-06-26 LAB — COMPREHENSIVE METABOLIC PANEL
ALT: 56 U/L — ABNORMAL HIGH (ref 0–44)
AST: 23 U/L (ref 15–41)
Albumin: 4.3 g/dL (ref 3.5–5.0)
Alkaline Phosphatase: 87 U/L (ref 38–126)
Anion gap: 11 (ref 5–15)
BUN: 15 mg/dL (ref 6–20)
CO2: 27 mmol/L (ref 22–32)
Calcium: 9.2 mg/dL (ref 8.9–10.3)
Chloride: 101 mmol/L (ref 98–111)
Creatinine, Ser: 0.62 mg/dL (ref 0.61–1.24)
GFR, Estimated: >60 mL/min (ref >60)
Glucose, Bld: 110 mg/dL — ABNORMAL HIGH (ref 70–99)
Potassium: 3.7 mmol/L (ref 3.5–5.1)
Sodium: 139 mmol/L (ref 135–145)
Total Bilirubin: 0.7 mg/dL (ref 0.3–1.2)
Total Protein: 8.1 g/dL (ref 6.5–8.1)

## 2022-06-26 LAB — TROPONIN I (HIGH SENSITIVITY)
Troponin I (High Sensitivity): <2 ng/L (ref <18)
Troponin I (High Sensitivity): <2 ng/L (ref <18)

## 2022-06-26 LAB — BRAIN NATRIURETIC PEPTIDE: B Natriuretic Peptide: 6.9 pg/mL (ref 0.0–100.0)

## 2022-06-26 MED ORDER — IOHEXOL 350 MG/ML SOLN
75.0000 mL | Freq: Once | INTRAVENOUS | Status: AC | PRN
  Administered 2022-06-26: 75 mL via INTRAVENOUS

## 2022-06-26 MED ORDER — SODIUM CHLORIDE (PF) 0.9 % IJ SOLN
INTRAMUSCULAR | Status: AC
  Filled 2022-06-26: qty 50
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
## 2022-06-26 NOTE — ED Triage Notes (Signed)
Patient reports central chest pain onset today , seen at [facility] ER this afternoon for the same complaints , chest x-ray , CT scan and blood tests done .

## 2022-06-26 NOTE — Discharge Instructions (Signed)
Your history, exam, workup today led [place] to do labs and imaging to rule out concerning cause of your symptoms.  I suspect your recent coronavirus infection may have caused some of the chest discomfort you have had as the CT scan did not show evidence of blood clot or pneumonia.  It does show some viral changes related likely to the COVID infection.  It also showed was called a hiatal hernia which may contribute to some of your symptoms.  Please follow-up with a GI team and a general surgeon for this.  Please rest and stay hydrated.  Please try to avoid drug use.  If any symptoms change or worsen acutely, please return to the nearest emergency department.

## 2022-06-26 NOTE — Discharge Instructions (Addendum)
You can take Motrin and Tylenol as needed as directed for your chest wall pain.  Recommend follow-up with your primary care provider, please call the provided clinic to schedule an appointment.

## 2022-06-26 NOTE — ED Notes (Signed)
Patient asking Why his Pale and white.

## 2022-06-26 NOTE — ED Notes (Signed)
Patient ask for 6 warm blankets

## 2022-06-26 NOTE — ED Provider Notes (Signed)
[facility] Provider Note   CSN: [id number] Arrival date & time: 06/26/22  9211     History  Chief Complaint  Patient presents with   Shortness of Breath    [facility] Joseph Nunez is a 27 y.o. male.  The history is provided by the patient and medical records. No language interpreter was used.  Shortness of Breath Severity:  Moderate Onset quality:  Gradual Duration:  2 days Timing:  Constant Progression:  Waxing and waning Chronicity:  Recurrent Context: URI   Relieved by:  Nothing Worsened by:  Deep breathing and coughing Ineffective treatments:  None tried Associated symptoms: chest pain, cough and sputum production   Associated symptoms: no abdominal pain, no fever (chills reported), no headaches, no neck pain, no rash, no vomiting and no wheezing   Risk factors: no hx of PE/DVT        Home Medications Prior to Admission medications   Medication Sig Start Date End Date Taking? Authorizing Provider  amphetamine-dextroamphetamine (ADDERALL XR) 10 MG 24 hr capsule Take 10 mg by mouth daily. Patient not taking: Reported on 06/24/2022    [provider]  hydrOXYzine (VISTARIL) 25 MG capsule Take 25 mg by mouth 3 (three) times daily as needed for anxiety. Patient not taking: Reported on 06/24/2022    [provider]  ibuprofen (ADVIL) 200 MG tablet Take 600 mg by mouth as needed for headache or moderate pain.    [provider]  propranolol (INDERAL) 10 MG tablet Take 10 mg by mouth 3 (three) times daily. Patient not taking: Reported on 06/24/2022    [provider]  QUEtiapine (SEROQUEL) 25 MG tablet Take 1 tablet (25 mg total) by mouth daily. Patient not taking: Reported on 06/24/2022 02/23/22   Joseph Noble Latif, DO  QUEtiapine (SEROQUEL) 50 MG tablet Take 1 tablet (50 mg total) by mouth at bedtime. Patient not taking: Reported on 06/24/2022 02/22/22   Joseph Noble Latif, DO  thiamine  (VITAMIN B-1) 100 MG tablet Take 1 tablet (100 mg total) by mouth daily. 02/23/22   Joseph Noble Latif, DO      Allergies    Fish allergy, Suboxone [buprenorphine hcl-naloxone hcl], and Penicillins    Review of Systems   Review of Systems  Constitutional:  Positive for chills and fatigue. Negative for fever (chills reported).  HENT:  Positive for congestion.   Eyes:  Negative for visual disturbance.  Respiratory:  Positive for cough, sputum production, chest tightness and shortness of breath. Negative for wheezing and stridor.   Cardiovascular:  Positive for chest pain. Negative for palpitations and leg swelling.  Gastrointestinal:  Negative for abdominal pain, constipation, diarrhea, nausea and vomiting.  Genitourinary:  Negative for dysuria.  Musculoskeletal:  Negative for back pain, neck pain and neck stiffness.  Skin:  Negative for rash and wound.  Neurological:  Negative for light-headedness, numbness and headaches.  Psychiatric/Behavioral:  Negative for agitation and confusion.   All other systems reviewed and are negative.   Physical Exam Updated Vital Signs BP 113/72   Pulse 79   Temp 98.3 F (36.8 C) (Oral)   Resp 20   Ht 5\' 5"  (1.651 m)   Wt 61 kg   SpO2 100%   BMI 22.38 kg/m  Physical Exam Vitals and nursing note reviewed.  Constitutional:      General: He is not in acute distress.    Appearance: He is well-developed. He is not ill-appearing, toxic-appearing or diaphoretic.  HENT:     Head: Normocephalic and atraumatic.  Eyes:     Conjunctiva/sclera: Conjunctivae normal.     Pupils: Pupils are equal, round, and reactive to light.  Cardiovascular:     Rate and Rhythm: Normal rate and regular rhythm.     Heart sounds: No murmur heard. Pulmonary:     Effort: Pulmonary effort is normal. Tachypnea present. No respiratory distress.     Breath sounds: Rhonchi present. No decreased breath sounds, wheezing or rales.  Chest:     Chest wall: No tenderness.   Abdominal:     Palpations: Abdomen is soft.     Tenderness: There is no abdominal tenderness.  Musculoskeletal:        General: No swelling.     Cervical back: Neck supple.     Right lower leg: No tenderness. No edema.     Left lower leg: No tenderness. No edema.  Skin:    General: Skin is warm and dry.     Capillary Refill: Capillary refill takes less than 2 seconds.     Findings: No erythema.  Neurological:     General: No focal deficit present.     Mental Status: He is alert.  Psychiatric:        Mood and Affect: Mood normal.     ED Results / Procedures / Treatments   Labs (all labs ordered are listed, but only abnormal results are displayed) Labs Reviewed  COMPREHENSIVE METABOLIC PANEL - Abnormal; Notable for the following components:      Result Value   Glucose, Bld 110 (*)    ALT 56 (*)    All other components within normal limits  CULTURE, BLOOD (ROUTINE X 2)  CULTURE, BLOOD (ROUTINE X 2)  CBC WITH DIFFERENTIAL/PLATELET  BRAIN NATRIURETIC PEPTIDE  TROPONIN I (HIGH SENSITIVITY)  TROPONIN I (HIGH SENSITIVITY)    EKG EKG Interpretation  Date/Time:  Tuesday June 26 2022 09:25:14 EST Ventricular Rate:  80 PR Interval:  147 QRS Duration: 83 QT Interval:  358 QTC Calculation: 413 R Axis:   95 Text Interpretation: Sinus rhythm Consider right atrial enlargement Borderline right axis deviation when compared to prior, similar appearance. No STEMI Confirmed by Antony Blackbird [id number]) on 06/26/2022 9:55:21 AM  Radiology ECHOCARDIOGRAM COMPLETE  Result Date: 06/24/2022    ECHOCARDIOGRAM REPORT   Patient Name:   [facility] Joseph Nunez Date of Exam: 06/24/2022 Medical Rec #:  [id number]                   Height:       68.0 in Accession #:    [id number]                  Weight:       140.0 lb Date of Birth:  1995-09-13                   BSA:          1.756 m Patient Age:    26 years                    BP:           118/60 mmHg Patient Gender: M                            HR:           72 bpm. Exam Location:  Inpatient Procedure: 2D Echo  Indications:    murmur  History:        Patient has no prior history of Echocardiogram examinations.                 Risk Factors:Current Smoker. 03/08/17 - TEE.  Sonographer:    Harvie Junior Referring Phys: [id number] [place]  1. Left ventricular ejection fraction, by estimation, is 65 to 70%. Left ventricular ejection fraction by 3D volume is 70 %. The left ventricle has normal function. The left ventricle has no regional wall motion abnormalities. Left ventricular diastolic  parameters were normal.  2. Right ventricular systolic function is normal. The right ventricular size is normal. Tricuspid regurgitation signal is inadequate for assessing PA pressure.  3. The mitral valve is grossly normal. No evidence of mitral valve regurgitation.  4. The aortic valve is tricuspid. Aortic valve regurgitation is not visualized.  5. The inferior vena cava is normal in size with greater than 50% respiratory variability, suggesting right atrial pressure of 3 mmHg. Comparison(s): Changes from prior study are noted. 03/08/2017: (TEE) LVEF 60-65%. FINDINGS  Left Ventricle: Left ventricular ejection fraction, by estimation, is 65 to 70%. Left ventricular ejection fraction by 3D volume is 70 %. The left ventricle has normal function. The left ventricle has no regional wall motion abnormalities. The left ventricular internal cavity size was normal in size. There is no left ventricular hypertrophy. Left ventricular diastolic parameters were normal. Right Ventricle: The right ventricular size is normal. No increase in right ventricular wall thickness. Right ventricular systolic function is normal. Tricuspid regurgitation signal is inadequate for assessing PA pressure. Left Atrium: Left atrial size was normal in size. Right Atrium: Right atrial size was normal in size. Pericardium: There is no evidence of pericardial effusion. Mitral Valve: The mitral  valve is grossly normal. No evidence of mitral valve regurgitation. Tricuspid Valve: The tricuspid valve is grossly normal. Tricuspid valve regurgitation is trivial. Aortic Valve: The aortic valve is tricuspid. Aortic valve regurgitation is not visualized. Aortic valve mean gradient measures 3.0 mmHg. Aortic valve peak gradient measures 6.5 mmHg. Aortic valve area, by VTI measures 3.78 cm. Pulmonic Valve: The pulmonic valve was normal in structure. Pulmonic valve regurgitation is not visualized. Aorta: The aortic root and ascending aorta are structurally normal, with no evidence of dilitation. Venous: The inferior vena cava is normal in size with greater than 50% respiratory variability, suggesting right atrial pressure of 3 mmHg. IAS/Shunts: No atrial level shunt detected by color flow Doppler.  LEFT VENTRICLE PLAX 2D LVIDd:         4.70 cm         Diastology LVIDs:         2.90 cm         LV e' medial:    10.70 cm/s LV PW:         0.80 cm         LV E/e' medial:  9.4 LV IVS:        0.80 cm         LV e' lateral:   21.30 cm/s LVOT diam:     2.10 cm         LV E/e' lateral: 4.7 LV SV:         86 LV SV Index:   49 LVOT Area:     3.46 cm        3D Volume EF  LV 3D EF:    Left                                             ventricul LV Volumes (MOD)                            ar LV vol d, MOD    86.6 ml                    ejection A2C:                                        fraction LV vol d, MOD    78.9 ml                    by 3D A4C:                                        volume is LV vol s, MOD    31.3 ml                    70 %. A2C: LV vol s, MOD    30.5 ml A4C:                           3D Volume EF: LV SV MOD A2C:   55.3 ml       3D EF:        70 % LV SV MOD A4C:   78.9 ml       LV EDV:       133 ml LV SV MOD BP:    54.0 ml       LV ESV:       40 ml                                LV SV:        93 ml RIGHT VENTRICLE RV Basal diam:  3.00 cm RV Mid diam:    3.20 cm RV S prime:      15.50 cm/s TAPSE (M-mode): 1.7 cm LEFT ATRIUM             Index        RIGHT ATRIUM           Index LA diam:        2.70 cm 1.54 cm/m   RA Area:     11.30 cm LA Vol (A2C):   22.1 ml 12.58 ml/m  RA Volume:   25.30 ml  14.41 ml/m LA Vol (A4C):   30.6 ml 17.42 ml/m LA Biplane Vol: 26.6 ml 15.15 ml/m  AORTIC VALVE                    PULMONIC VALVE AV Area (Vmax):    3.14 cm     PV Vmax:       1.03 m/s AV Area (Vmean):   3.39 cm     PV Peak grad:  4.2 mmHg AV Area (VTI):  3.78 cm AV Vmax:           127.00 cm/s AV Vmean:          84.400 cm/s AV VTI:            0.227 m AV Peak Grad:      6.5 mmHg AV Mean Grad:      3.0 mmHg LVOT Vmax:         115.00 cm/s LVOT Vmean:        82.500 cm/s LVOT VTI:          0.248 m LVOT/AV VTI ratio: 1.09  AORTA Ao Root diam: 2.90 cm MITRAL VALVE MV Area (PHT): 3.27 cm     SHUNTS MV Decel Time: 232 msec     Systemic VTI:  0.25 m MV E velocity: 101.00 cm/s  Systemic Diam: 2.10 cm MV A velocity: 46.60 cm/s MV E/A ratio:  2.17 Lyman Bishop MD Electronically signed by Lyman Bishop MD Signature Date/Time: 06/24/2022/4:06:15 PM    Final     Procedures Procedures    Medications Ordered in ED Medications  sodium chloride (PF) 0.9 % injection (  Given by Other 06/26/22 1210)  iohexol (OMNIPAQUE) 350 MG/ML injection 75 mL (75 mLs Intravenous Contrast Given 06/26/22 1209)    ED Course/ Medical Decision Making/ A&P                             Medical Decision Making Amount and/or Complexity of Data Reviewed Labs: ordered. Radiology: ordered.  Risk Prescription drug management.    [facility] Joseph Nunez is a 27 y.o. male with a past medical history significant polysubstance abuse, IV drug abuse, recent admission for COVID-19, chronic hepatitis C, and tobacco abuse who presents with chest pain and shortness of breath.  According to patient, he just left the hospital several days ago from a [place] admission and was having worsened chest pain and shortness of breath and  cough.  Patient is concerned he may have pneumonia.  Denies any trauma.  Denies nausea or vomiting and is hungry and asking for sandwiches.  Denies any other complaints here.  On exam, lungs were clear and chest was slightly tender to palpation diffusely no rash or cellulitis seen..  Abdomen was nontender.  Extremities.  Patient able to eat multiple sandwiches without difficulty.  Patient resting.  EKG does not show STEMI.  Vital signs reassuring on reassessment.  Due to his recent COVID and what he is describing is pleuritic discomfort with shortness of breath in his chest we agreed to get a CT PE study to rule out pulmonary embolism or occult pneumonia.  CT scan returned showing no pulmonary embolism and no evidence of pneumonia.  There is some viral changes seen.  Otherwise troponin negative x 2 and labs similar to prior.  Given reassuring labs and monitoring for over 6 hours, we do feel he is safe for discharge home.  We did find a hiatal hernia which we gave him information to follow-up with both GI and general surgery but we feel he is safe for discharge home.  Patient discharged in stable condition for outpatient follow-up.             Final Clinical Impression(s) / ED Diagnoses Final diagnoses:  Atypical chest pain  SOB (shortness of breath)  Hiatal hernia    Rx / DC Orders ED Discharge Orders     None  Clinical Impression: 1. Atypical chest pain   2. SOB (shortness of breath)   3. Hiatal hernia     Disposition: Discharge  Condition: Good  I have discussed the results, Dx and Tx plan with the pt(& family if present). He/she/they expressed understanding and agree(s) with the plan. Discharge instructions discussed at great length. Strict return precautions discussed and pt &/or family have verbalized understanding of the instructions. No further questions at time of discharge.    New Prescriptions   No medications on file    Follow  Up: Gastroenterology, [address] [place] [address] [phone]     Surgery, [place] [address] [place] [facility] at [facility] [address] [place]       Giovanny Dugal, Gwenyth Allegra, MD 06/26/22 [phone]

## 2022-06-26 NOTE — ED Notes (Signed)
Patient complain of numbness on his feet but walking around in the hallway.

## 2022-06-26 NOTE — ED Notes (Signed)
Patient transported to CT 
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
## 2022-06-26 NOTE — ED Notes (Signed)
Patient ask for a lot of water bottles

## 2022-06-26 NOTE — ED Provider Notes (Cosign Needed Addendum)
[facility] Provider Note   CSN: [id number] Arrival date & time: 06/26/22  2346     History  Chief Complaint  Patient presents with   Chest Pain    [facility] [name] is a 27 y.o. male.  27 year old male with history of polysubstance abuse, MRSA, tobacco abuse, recent COVID diagnosis return to the emergency room for his third visit in 24 hours complaining of ongoing chest pain.  Denies any new or different symptoms.  Is speaking in complete sentences, in no distress.  Vitals reviewed and reassuring.       Home Medications Prior to Admission medications   Medication Sig Start Date End Date Taking? Authorizing Provider  amphetamine-dextroamphetamine (ADDERALL XR) 10 MG 24 hr capsule Take 10 mg by mouth daily. Patient not taking: Reported on 06/24/2022    [provider]  hydrOXYzine (VISTARIL) 25 MG capsule Take 25 mg by mouth 3 (three) times daily as needed for anxiety. Patient not taking: Reported on 06/24/2022    [provider]  ibuprofen (ADVIL) 200 MG tablet Take 600 mg by mouth as needed for headache or moderate pain.    [provider]  propranolol (INDERAL) 10 MG tablet Take 10 mg by mouth 3 (three) times daily. Patient not taking: Reported on 06/24/2022    [provider]  QUEtiapine (SEROQUEL) 25 MG tablet Take 1 tablet (25 mg total) by mouth daily. Patient not taking: Reported on 06/24/2022 02/23/22   [name], DO  QUEtiapine (SEROQUEL) 50 MG tablet Take 1 tablet (50 mg total) by mouth at bedtime. Patient not taking: Reported on 06/24/2022 02/22/22   [name], DO  thiamine (VITAMIN B-1) 100 MG tablet Take 1 tablet (100 mg total) by mouth daily. 02/23/22   [name], DO      Allergies    Fish allergy, Suboxone [buprenorphine hcl-naloxone hcl], and Penicillins    Review of Systems   Review of Systems Negative except as per HPI physical Exam Updated Vital  Signs BP 111/78   Pulse 97   Temp (!) 97.5 F (36.4 C) (Oral)   Resp 18   SpO2 99%  Physical Exam Vitals and nursing note reviewed.  Constitutional:      General: He is not in acute distress.    Appearance: He is well-developed. He is not diaphoretic.  HENT:     Head: Normocephalic and atraumatic.  Cardiovascular:     Rate and Rhythm: Normal rate and regular rhythm.     Heart sounds: Normal heart sounds. No murmur heard. Pulmonary:     Effort: Pulmonary effort is normal.     Breath sounds: Normal breath sounds.  Chest:     Chest wall: Tenderness present.  Abdominal:     Palpations: Abdomen is soft.     Tenderness: There is no abdominal tenderness.  Musculoskeletal:     Cervical back: Neck supple.     Right lower leg: No tenderness. No edema.     Left lower leg: No tenderness. No edema.  Skin:    General: Skin is warm and dry.     Findings: No erythema or rash.  Neurological:     Mental Status: He is alert and oriented to person, place, and time.  Psychiatric:        Behavior: Behavior normal.     ED Results / Procedures / Treatments   Labs (all labs ordered are listed, but only abnormal results are displayed)  Labs Reviewed - No data to display  EKG None  Radiology CT Angio Chest PE W and/or Wo Contrast  Result Date: 06/26/2022 CLINICAL DATA:  26 year old male presenting for evaluation of shortness of breath and pain in the chest and back. EXAM: CT ANGIOGRAPHY CHEST WITH CONTRAST TECHNIQUE: Multidetector CT imaging of the chest was performed using the standard protocol during bolus administration of intravenous contrast. Multiplanar CT image reconstructions and MIPs were obtained to evaluate the vascular anatomy. RADIATION DOSE REDUCTION: This exam was performed according to the departmental dose-optimization program which includes automated exposure control, adjustment of the mA and/or kV according to patient size and/or use of iterative reconstruction technique.  CONTRAST:  38mL OMNIPAQUE IOHEXOL 350 MG/ML SOLN COMPARISON:  October of 2018. FINDINGS: Cardiovascular: Smooth contour and normal caliber of the thoracic aorta. Limited assessment on this study which was performed for evaluation of pulmonary vasculature. Heart size top normal without pericardial effusion or signs of pericardial nodularity likely accentuated by decreased lung volumes. Central pulmonary arteries are opacified to 397 Hounsfield units. Mildly limited assessment of subsegmental branches due to motion artifact. No signs of pulmonary embolism accounting for this mild limitation Mediastinum/Nodes: No thoracic inlet, axillary or signs of mediastinal lymphadenopathy. Small hiatal hernia and mildly patulous esophagus. Lungs/Pleura: No consolidation. No pleural effusion. Airways are patent. Mild bronchial wall thickening. No pneumothorax. Upper Abdomen: No acute upper abdominal findings to the extent evaluated. Musculoskeletal: No acute musculoskeletal process. No destructive bone findings. Review of the MIP images confirms the above findings. IMPRESSION: Negative for pulmonary embolism. Study quality is good with mildly limited assessment however of subsegmental vessels particularly at the lung bases. Mild bronchial wall thickening could reflect mild bronchial inflammation/bronchitis or viral illness. Mildly patulous esophagus and small hiatal hernia. Correlate with any clinical signs of esophageal dysmotility or reflux with dedicated evaluation as warranted. Electronically Signed   By: Zetta Bills M.D.   On: 06/26/2022 12:34    Procedures Procedures    Medications Ordered in ED Medications - No data to display  ED Course/ Medical Decision Making/ A&P                             Medical Decision Making  27 year old male brought in by EMS with ongoing left-sided chest pain.  Patient presents for his third visit in the past 24 hours.  Most recently, seen 12 hours ago with CTA of the chest that  was negative for PE.  He does have mild bronchial wall thickening likely secondary to his recent COVID diagnosis.  Review of records, admitted to the hospital last week, had an echo 2 days ago with an EF of 65 to 70%, no valve disease.  Discussed results with patient, reassuring workup earlier today.  EKG today without ischemic changes.  Patient is advised to follow-up with primary care for is provided with resources for primary care and shelter.  EKG with normal sinus rhythm, rate 90.       Final Clinical Impression(s) / ED Diagnoses Final diagnoses:  Chest wall pain    Rx / DC Orders ED Discharge Orders     None         Tacy Learn, PA-C 06/27/22 0000    Tacy Learn, PA-C 06/27/22 0003    Merryl Hacker, MD 06/27/22 [phone]

## 2022-06-27 LAB — CULTURE, BLOOD (ROUTINE X 2)
Culture: NO GROWTH
Culture: NO GROWTH
Special Requests: ADEQUATE
Special Requests: ADEQUATE

## 2022-07-01 LAB — CULTURE, BLOOD (ROUTINE X 2)
Culture: NO GROWTH
Special Requests: ADEQUATE

## 2022-08-29 ENCOUNTER — Other Ambulatory Visit: Payer: Self-pay

## 2022-08-29 ENCOUNTER — Emergency Department (HOSPITAL_COMMUNITY): Payer: Self-pay

## 2022-08-29 ENCOUNTER — Emergency Department (HOSPITAL_COMMUNITY)
Admission: EM | Admit: 2022-08-29 | Discharge: 2022-08-30 | Disposition: A | Discharge status: Home / Self Care | Payer: Self-pay | Attending: Emergency Medicine | Admitting: Emergency Medicine

## 2022-08-29 ENCOUNTER — Encounter (HOSPITAL_COMMUNITY): Payer: Self-pay

## 2022-08-29 DIAGNOSIS — F319 Bipolar disorder, unspecified: Secondary | ICD-10-CM | POA: Diagnosis present

## 2022-08-29 DIAGNOSIS — X58XXXA Exposure to other specified factors, initial encounter: Secondary | ICD-10-CM | POA: Insufficient documentation

## 2022-08-29 DIAGNOSIS — R4182 Altered mental status, unspecified: Secondary | ICD-10-CM | POA: Insufficient documentation

## 2022-08-29 DIAGNOSIS — F1910 Other psychoactive substance abuse, uncomplicated: Secondary | ICD-10-CM | POA: Insufficient documentation

## 2022-08-29 DIAGNOSIS — R799 Abnormal finding of blood chemistry, unspecified: Secondary | ICD-10-CM | POA: Insufficient documentation

## 2022-08-29 DIAGNOSIS — F1990 Other psychoactive substance use, unspecified, uncomplicated: Secondary | ICD-10-CM | POA: Diagnosis present

## 2022-08-29 DIAGNOSIS — T402X4A Poisoning by other opioids, undetermined, initial encounter: Secondary | ICD-10-CM | POA: Insufficient documentation

## 2022-08-29 DIAGNOSIS — T40604A Poisoning by unspecified narcotics, undetermined, initial encounter: Secondary | ICD-10-CM

## 2022-08-29 LAB — CBC WITH DIFFERENTIAL/PLATELET
Abs Immature Granulocytes: 0.02 10*3/uL (ref 0.00–0.07)
Basophils Absolute: 0 10*3/uL (ref 0.0–0.1)
Basophils Relative: 0 %
Eosinophils Absolute: 0.2 10*3/uL (ref 0.0–0.5)
Eosinophils Relative: 2 %
HCT: 39.4 % (ref 39.0–52.0)
Hemoglobin: 13.4 g/dL (ref 13.0–17.0)
Immature Granulocytes: 0 %
Lymphocytes Relative: 25 %
Lymphs Abs: 2.4 10*3/uL (ref 0.7–4.0)
MCH: 30.5 pg (ref 26.0–34.0)
MCHC: 34 g/dL (ref 30.0–36.0)
MCV: 89.5 fL (ref 80.0–100.0)
Monocytes Absolute: 0.8 10*3/uL (ref 0.1–1.0)
Monocytes Relative: 8 %
Neutro Abs: 6.1 10*3/uL (ref 1.7–7.7)
Neutrophils Relative %: 65 %
Platelets: 265 10*3/uL (ref 150–400)
RBC: 4.4 MIL/uL (ref 4.22–5.81)
RDW: 13.2 % (ref 11.5–15.5)
WBC: 9.5 10*3/uL (ref 4.0–10.5)
nRBC: 0 % (ref 0.0–0.2)

## 2022-08-29 LAB — COMPREHENSIVE METABOLIC PANEL
ALT: 33 U/L (ref 0–44)
AST: 17 U/L (ref 15–41)
Albumin: 4.1 g/dL (ref 3.5–5.0)
Alkaline Phosphatase: 61 U/L (ref 38–126)
Anion gap: 6 (ref 5–15)
BUN: 16 mg/dL (ref 6–20)
CO2: 30 mmol/L (ref 22–32)
Calcium: 8.5 mg/dL — ABNORMAL LOW (ref 8.9–10.3)
Chloride: 100 mmol/L (ref 98–111)
Creatinine, Ser: 0.72 mg/dL (ref 0.61–1.24)
GFR, Estimated: >60 mL/min (ref >60)
Glucose, Bld: 102 mg/dL — ABNORMAL HIGH (ref 70–99)
Potassium: 3.6 mmol/L (ref 3.5–5.1)
Sodium: 136 mmol/L (ref 135–145)
Total Bilirubin: 0.8 mg/dL (ref 0.3–1.2)
Total Protein: 6.7 g/dL (ref 6.5–8.1)

## 2022-08-29 LAB — ETHANOL: Alcohol, Ethyl (B): <10 mg/dL (ref <10)

## 2022-08-29 LAB — CBG MONITORING, ED: Glucose-Capillary: 107 mg/dL — ABNORMAL HIGH (ref 70–99)

## 2022-08-29 MED ORDER — NALOXONE HCL 4 MG/10ML IJ SOLN
0.2500 mg/h | INTRAVENOUS | Status: DC
  Administered 2022-08-29: 0.25 mg/h via INTRAVENOUS
  Filled 2022-08-29 (×2): qty 10

## 2022-08-29 MED ORDER — HALOPERIDOL LACTATE 5 MG/ML IJ SOLN
5.0000 mg | Freq: Once | INTRAMUSCULAR | Status: AC
  Administered 2022-08-29: 5 mg via INTRAVENOUS
  Filled 2022-08-29: qty 1

## 2022-08-29 MED ORDER — NALOXONE HCL 2 MG/2ML IJ SOSY
PREFILLED_SYRINGE | INTRAMUSCULAR | Status: AC
  Administered 2022-08-29: 0.4 mg
  Filled 2022-08-29: qty 2

## 2022-08-29 MED ORDER — NALOXONE HCL 0.4 MG/ML IJ SOLN: 0.4000 mg | Freq: Once | INTRAMUSCULAR | Status: DC

## 2022-08-29 NOTE — ED Notes (Signed)
Pt continuously asking for food. Pt notified that he was unresponsive 10 minutes ago and is not able to eat due to him being in and out of consciousness and responding to Narcan

## 2022-08-29 NOTE — ED Notes (Signed)
Pt falling asleep again. Reponds minimally to painful stimuli. ETCO2 applied

## 2022-08-29 NOTE — ED Notes (Signed)
Pt responding to Narcan. Pt awake and talking. Pt trying to rip off all monitoring devices and leave. Pt advised to stay and cooperates at present.
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
## 2022-08-29 NOTE — ED Notes (Signed)
Pt is unresponsive to voice, responds minimally to pain. Dr. Jearld Fenton aware.

## 2022-08-29 NOTE — ED Notes (Addendum)
Pt is breathing 3-4 times a minute. Pt moved from hall c to room 22. Dr. Jearld Fenton notified. V/o for IM narcan given

## 2022-08-30 DIAGNOSIS — F1990 Other psychoactive substance use, unspecified, uncomplicated: Secondary | ICD-10-CM

## 2022-08-30 MED ORDER — ZIPRASIDONE MESYLATE 20 MG IM SOLR
20.0000 mg | INTRAMUSCULAR | Status: DC | PRN
  Filled 2022-08-30: qty 20

## 2022-08-30 MED ORDER — NALOXONE HCL 0.4 MG/ML IJ SOLN
INTRAMUSCULAR | Status: AC
  Filled 2022-08-30: qty 1

## 2022-08-30 MED ORDER — HYDROXYZINE PAMOATE 25 MG PO CAPS: 25.0000 mg | ORAL_CAPSULE | Freq: Three times a day (TID) | ORAL | Status: DC | PRN

## 2022-08-30 MED ORDER — STERILE WATER FOR INJECTION IJ SOLN
INTRAMUSCULAR | Status: AC
  Filled 2022-08-30: qty 10

## 2022-08-30 MED ORDER — LORAZEPAM 1 MG PO TABS: 1.0000 mg | ORAL_TABLET | ORAL | Status: DC | PRN

## 2022-08-30 MED ORDER — NALOXONE HCL 2 MG/2ML IJ SOSY
2.0000 mg | PREFILLED_SYRINGE | Freq: Once | INTRAMUSCULAR | Status: AC
  Administered 2022-08-30: 2 mg via INTRAVENOUS

## 2022-08-30 MED ORDER — RISPERIDONE 0.5 MG PO TBDP: 2.0000 mg | ORAL_TABLET | Freq: Three times a day (TID) | ORAL | Status: DC | PRN

## 2022-08-30 NOTE — ED Notes (Signed)
Pt mother called for update. Pt mother updated
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
## 2022-08-30 NOTE — ED Notes (Signed)
Pt mother notified of pt d/c. Mother requesting refill of psych meds

## 2022-08-30 NOTE — Consult Note (Signed)
[facility] ED ASSESSMENT   Reason for Consult:  Psych Consult Referring Physician:  Dr. Jearld Fenton Patient Identification: [facility] MRN:  [id number] ED Chief Complaint: Substance use disorder  Diagnosis:  Principal Problem:   Substance use disorder Active Problems:   Bipolar I disorder   ED Assessment Time Calculation: Start Time: 1130 Stop Time: 1145 Total Time in Minutes (Assessment Completion): 15    HPI: Per Triage Note: BIB EMS from outside. Pt has had multiple welfare checks today due to nodding off and falling asleep outside, pt has taken drugs today. EMS gave 0.5IM Narcan and pt became more reponsive. Pt is alert and oriented on arrival. Pt asking to leave on arrival. Denies SI/HI.    Subjective:  [facility], 27 y.o., male patient seen face to face by this provider, consulted with Dr. Lucianne Muss; and chart reviewed on 08/30/22.  On evaluation [facility] reports that he has somewhere to go, he lives with his mother and he is ready to go home.  Patient asked provider to get him his clothing.  Provider discussed with him that he is unable to leave this facility until he can answer some questions.  Patient denies SI/HI/AVH, states "I do not have a drug problem."Patient refuses to go to an inpatient treatment facility, says he does not need help, states he is ready to leave, says until he can leave he is going back to sleep. Pt gives brief answers to all questions and appears uninterested in participating.    During evaluation [facility] is laying in bed in no acute distress. He is alert, dozing off at times, needs redirection from provider.  Oriented x 3, calm, cooperative and attentive. His mood is irritable with flat affect. His speech is slurred, and uncooperative behavior.  Objectively there is no evidence of psychosis/mania or delusional thinking.  Patient is able to converse coherently, goal directed thoughts, or pre-occupation. He  denies suicidal/self-harm/homicidal ideation, psychosis, and paranoia.  Patient seen by this provider twice this morning, first time was unsuccessful due to patient receiving Narcan, this time, patient continues to be sedated, however does awaken with the calling of his name. He continues to lack insight and judgment, in regards to her psychosis and continues to minimize his current substance use.   At this time [facility] is educated and verbalizes understanding of mental health resources and other crisis services in the community. He is instructed to call 911 and present to the nearest emergency room should he experience any suicidal/homicidal ideation, auditory/visual/hallucinations, or detrimental worsening of his mental health condition.   Per RN Note earlier this morning patient went into bathroom and locked door. After a few min it was brought to the writers attention he was not coming out and the door was locked. Writer unlocked the door and assisted pt back to room with sitter. Pt refusing to change into scrubs and be wanded. Pt continues to argue with staff. After attempt to get by security pt put in bed by security. Pt still refused to change. During this time pt became very sleepy and was falling forward while sitting on edge of bed.    Past Psychiatric History: [name] with history of polysubstance use, substance induced mood disorder vs BiPD, chronic HCV with recent admission 6/20-6/26 for acute agitation requiring sedation and intubation who presents to ED this evening BIBEMS after being found at bus stop rolling in grass/acting erratically. Pt's mother  had reported to EMS that he uses bath salts, has been eating spoiled food recently. He was acutely agitated on arrival to ED requiring multiple sedating medications for safety. Ultimately he required intubation   Risk to Self or Others: Is the patient at risk to self? Patient denies Has the patient been a risk to self in the  past 6 months? No Has the patient been a risk to self within the distant past? Patient denies Is the patient a risk to others? Patient denies Has the patient been a risk to others in the past 6 months? Patient denies Has the patient been a risk to others within the distant past? Patient denies  [place] Scale:  Flowsheet Row ED from 08/29/2022 in [facility] Most recent reading at 08/29/2022  6:04 PM [facility] [address] [facility] Most recent reading at 06/26/2022 11:56 PM [facility] Most recent reading at 06/26/2022  9:26 AM  C-SSRS RISK CATEGORY No Risk No Risk No Risk       AIMS:  , , ,  ,   ASAM:    Substance Abuse:     Past Medical History:  Past Medical History:  Diagnosis Date   Cocaine abuse    Heroin abuse    IVDU (intravenous drug user)    MRSA (methicillin resistant staph aureus) culture positive    Substance abuse    Tobacco abuse     Past Surgical History:  Procedure Laterality Date   IRRIGATION AND DEBRIDEMENT ABSCESS N/A 03/08/2017   Procedure: IRRIGATION AND DEBRIDEMENT LEFT CHEST ABSCESS;  Surgeon: Karie SodaGross, Steven, MD;  Location: [facility];  Service: General;  Laterality: N/A;   TEE WITHOUT CARDIOVERSION N/A 03/08/2017   Procedure: TRANSESOPHAGEAL ECHOCARDIOGRAM (TEE);  Surgeon: Chilton Siandolph, Tiffany, MD;  [facility];  Service: Cardiovascular;  Laterality: N/A;   Family History:  Family History  Problem Relation Age of Onset   Drug abuse Mother    Heart disease Mother    Diabetes Mellitus II Sister     Social History:  Social History   Substance and Sexual Activity  Alcohol Use Yes     Social History   Substance and Sexual Activity  Drug Use Yes   Types: Amphetamines, Fentanyl, IV, Marijuana, MDMA (Ecstacy), Benzodiazepines, "Crack" cocaine, Heroin, Cocaine, Methamphetamines   Comment:  heroin,     Social History   Socioeconomic History   Marital status: Single    Spouse name: Not on file   Number of children: Not on file   Years of education: Not on file   Highest education level: High school graduate  Occupational History   Not on file  Tobacco Use   Smoking status: Every Day    Packs/day: .25    Types: Cigarettes   Smokeless tobacco: Never  Vaping Use   Vaping Use: Never used  Substance and Sexual Activity   Alcohol use: Yes   Drug use: Yes    Types: Amphetamines, Fentanyl, IV, Marijuana, MDMA (Ecstacy), Benzodiazepines, "Crack" cocaine, Heroin, Cocaine, Methamphetamines    Comment: heroin,    Sexual activity: Not Currently  Other Topics Concern   Not on file  Social History Narrative   ** Merged History Encounter **       Social Determinants of Health   Financial Resource Strain: Not on file  Food Insecurity: Food Insecurity Present (06/23/2022)   Hunger Vital Sign  Worried About Programme researcher, broadcasting/film/video in the Last Year: Often true    [employer] of Food in the Last Year: Often true  Transportation Needs: Unmet Transportation Needs (06/23/2022)   PRAPARE - Administrator, Civil Service (Medical): Yes    Lack of Transportation (Non-Medical): Yes  Physical Activity: Not on file  Stress: Not on file  Social Connections: Not on file    Allergies:   Allergies  Allergen Reactions   Fish Allergy Hives    Catfish   Suboxone [Buprenorphine Hcl-Naloxone Hcl] Anaphylaxis   Penicillins Rash    Has patient had a PCN reaction causing immediate rash, facial/tongue/throat swelling, SOB or lightheadedness with hypotension:yHas patient had a PCN reace cassing severe rash involving mucus membranes or skin necrosis: no Has patient had a PCN reaction that required hospitalization yes Has patient had a PCN reaction occurring within the last 10 years: no If all of the above answers are "NO", then may proceed with Cephalosporin use.     Labs:  Results for  orders placed or performed during the [facility] encounter of 08/29/22 (from the past 48 hour(s))  CBC with Differential     Status: None   Collection Time: 08/29/22  6:07 PM  Result Value Ref Range   WBC 9.5 4.0 - 10.5 K/uL   RBC 4.40 4.22 - 5.81 MIL/uL   Hemoglobin 13.4 13.0 - 17.0 g/dL   HCT 01.6 01.0 - 93.2 %   MCV 89.5 80.0 - 100.0 fL   MCH 30.5 26.0 - 34.0 pg   MCHC 34.0 30.0 - 36.0 g/dL   RDW 35.5 73.2 - 20.2 %   Platelets 265 150 - 400 K/uL   nRBC 0.0 0.0 - 0.2 %   Neutrophils Relative % 65 %   Neutro Abs 6.1 1.7 - 7.7 K/uL   Lymphocytes Relative 25 %   Lymphs Abs 2.4 0.7 - 4.0 K/uL   Monocytes Relative 8 %   Monocytes Absolute 0.8 0.1 - 1.0 K/uL   Eosinophils Relative 2 %   Eosinophils Absolute 0.2 0.0 - 0.5 K/uL   Basophils Relative 0 %   Basophils Absolute 0.0 0.0 - 0.1 K/uL   Immature Granulocytes 0 %   Abs Immature Granulocytes 0.02 0.00 - 0.07 K/uL    Comment: Performed at [facility], [address], [place], [place] [address]  Comprehensive metabolic panel     Status: Abnormal   Collection Time: 08/29/22  6:07 PM  Result Value Ref Range   Sodium 136 135 - 145 mmol/L   Potassium 3.6 3.5 - 5.1 mmol/L   Chloride 100 98 - 111 mmol/L   CO2 30 22 - 32 mmol/L   Glucose, Bld 102 (H) 70 - 99 mg/dL    Comment: Glucose reference range applies only to samples taken after fasting for at least 8 hours.   BUN 16 6 - 20 mg/dL   Creatinine, Ser 6.23 0.61 - 1.24 mg/dL   Calcium 8.5 (L) 8.9 - 10.3 mg/dL   Total Protein 6.7 6.5 - 8.1 g/dL   Albumin 4.1 3.5 - 5.0 g/dL   AST 17 15 - 41 U/L   ALT 33 0 - 44 U/L   Alkaline Phosphatase 61 38 - 126 U/L   Total Bilirubin 0.8 0.3 - 1.2 mg/dL   GFR, Estimated >76 >28 mL/min    Comment: (NOTE) Calculated using the CKD-EPI Creatinine Equation (2021)    Anion gap 6 5 - 15    Comment:  Performed at [facility], [address] [place], [place] [address]  Ethanol     Status: None   Collection  Time: 08/29/22  6:08 PM  Result Value Ref Range   Alcohol, Ethyl (B) <10 <10 mg/dL    Comment: (NOTE) Lowest detectable limit for serum alcohol is 10 mg/dL.  For medical purposes only. Performed at [facility], [address], [place], [place] [address]   POC CBG, ED     Status: Abnormal   Collection Time: 08/29/22  6:24 PM  Result Value Ref Range   Glucose-Capillary 107 (H) 70 - 99 mg/dL    Comment: Glucose reference range applies only to samples taken after fasting for at least 8 hours.    Current Facility-Administered Medications  Medication Dose Route Frequency Provider Last Rate Last Admin   hydrOXYzine (VISTARIL) capsule 25 mg  25 mg Oral TID PRN Jacalyn Lefevre, MD       naloxone [facility]) 0.4 MG/ML injection            naloxone (NARCAN) injection 0.4 mg  0.4 mg Intramuscular Once Loetta Rough, MD       naloxone HCl (NARCAN) 4 mg in dextrose 5 % 250 mL infusion  0.25 mg/hr Intravenous Continuous Loetta Rough, MD   Stopped at 08/30/22 0600   Current Outpatient Medications  Medication Sig Dispense Refill   amphetamine-dextroamphetamine (ADDERALL XR) 10 MG 24 hr capsule Take 10 mg by mouth daily.     hydrOXYzine (VISTARIL) 25 MG capsule Take 25 mg by mouth 3 (three) times daily as needed for anxiety.     ibuprofen (ADVIL) 200 MG tablet Take 600 mg by mouth as needed for headache or moderate pain.     propranolol (INDERAL) 10 MG tablet Take 10 mg by mouth 3 (three) times daily.     QUEtiapine (SEROQUEL) 25 MG tablet Take 1 tablet (25 mg total) by mouth daily. 30 tablet 0   QUEtiapine (SEROQUEL) 50 MG tablet Take 1 tablet (50 mg total) by mouth at bedtime. 30 tablet 0   thiamine (VITAMIN B-1) 100 MG tablet Take 1 tablet (100 mg total) by mouth daily. 30 tablet 0    Musculoskeletal:  Patient observed resting in bed.    Psychiatric Specialty Exam: Presentation  General Appearance:  Appropriate for Environment  Eye  Contact: Minimal  Speech: Slurred  Speech Volume: Decreased  Handedness: Right   Mood and Affect  Mood: Euthymic  Affect: Appropriate   Thought Process  Thought Processes: Coherent  Descriptions of Associations:Intact  Orientation:Partial  Thought Content:Scattered  History of Schizophrenia/Schizoaffective disorder:No data recorded Duration of Psychotic Symptoms:No data recorded Hallucinations:Hallucinations: None  Ideas of Reference:None  Suicidal Thoughts:Suicidal Thoughts: No  Homicidal Thoughts:Homicidal Thoughts: No   Sensorium  Memory: Immediate Fair; Remote Fair  Judgment: Impaired  Insight: Lacking   Executive Functions  Concentration: Poor  Attention Span: Poor  Recall: [employer] of Knowledge: Fair  Language: Fair   Psychomotor Activity  Psychomotor Activity: Psychomotor Activity: Normal   Assets  Assets: Manufacturing systems engineer; Housing; Social Support    Sleep  Sleep: Sleep: Fair   Physical Exam: Physical Exam Neurological:     Mental Status: He is alert.  Psychiatric:        Attention and Perception: He is inattentive.        Mood and Affect: Mood is anxious.        Speech: Speech is slurred.        Behavior:  Behavior is uncooperative.        Thought Content: Thought content normal.        Cognition and Memory: Memory normal.        Judgment: Judgment is impulsive and inappropriate.    Review of Systems  Psychiatric/Behavioral:  Positive for substance abuse.    Blood pressure 98/62, pulse 72, temperature 98 F (36.7 C), temperature source Oral, resp. rate 12, height 5\' 5"  (1.651 m), weight 61 kg, SpO2 98 %. Body mass index is 22.38 kg/m.   Medical Decision Making: Patient is psychiatrically cleared. Patient case review and discussed with Dr. Lucianne Muss, and patient does not meet inpatient criteria for inpatient psychiatric treatment. At time of discharge, patient denies SI, HI, AVH and can contract for  safety. He demonstrated no overt evidence of psychosis or mania. Prior to discharge, he verbalized that they understood warning signs, triggers, and symptoms of worsening mental health and how to access emergency mental health care if they felt it was needed. Patient was instructed to call 911 or return to the emergency room if they experienced any concerning symptoms after discharge. Patient voiced understanding and agreed to the above.  Patient given resources to follow up with behavioral health urgent care for therapy and medication management.   Disposition: Patient does not meet criteria for psychiatric inpatient admission. Supportive therapy provided about ongoing stressors. Discussed crisis plan, support from social network, calling 911, coming to the Emergency Department, and calling Suicide Hotline.  Alona Bene, PMHNP 08/30/2022 1:53 PM

## 2022-08-30 NOTE — ED Provider Notes (Signed)
Called to bedside by nursing staff who stated that patient was agitated and running around the hallways and a threat to staff and other patients.  Agitation protocol was ordered.  EKG, labs, and previous orders reviewed prior to this.  Went to evaluate patient and he was sitting at the end of the stretcher in the ED room and cooperative with nursing staff. Speech slightly slurred but awake and following commands with ease. He did not appear excessively agitated or to require emergent antipsychotic injections at this time.  Multiple members of nursing staff, security, and PD at bedside.  Patient does not currently appear to be a threat to himself or to staff.  Agitation protocol was canceled.  Nursing staff at bedside made aware of this and will alert to future needs for medication and reassessment.  Otherwise, patient is currently under IVC and I did not provide any other of patient's care during his emergency department stay.   Richardson Dopp 08/30/22 0801    Jacalyn Lefevre, MD 08/30/22 1114

## 2022-08-30 NOTE — ED Notes (Signed)
Dr. Particia Nearing at bedside speaking with pt

## 2022-08-30 NOTE — ED Notes (Signed)
Pt went into bathroom and locked door. After a few min it was brought to the writers attention he was not coming out and the door was locked. Writer unlocked the door and assisted pt back to room with sitter. Pt refusing to change into scrubs and be wanded. Pt continues to argue with staff. After attempt to get by security pt put in bed by security. Pt still refused to change. During this time pt became very sleepy and was falling forward while sitting on edge of bed. Pt was changed by staff. Dr Particia Nearing present. Narcan given with positive response. Pt noted to be have a large amount of nasal secretions as he was blowing them into the blanket. Pt sleeping at intervals but arouses with verbal stimuli. Continue to monitor pt at present time.

## 2022-09-06 ENCOUNTER — Emergency Department (HOSPITAL_COMMUNITY)
Admission: EM | Admit: 2022-09-06 | Discharge: 2022-09-09 | Disposition: A | Discharge status: Home / Self Care | Payer: Self-pay | Attending: Emergency Medicine | Admitting: Emergency Medicine

## 2022-09-06 ENCOUNTER — Other Ambulatory Visit: Payer: Self-pay

## 2022-09-06 ENCOUNTER — Encounter (HOSPITAL_COMMUNITY): Payer: Self-pay | Admitting: Emergency Medicine

## 2022-09-06 DIAGNOSIS — F1410 Cocaine abuse, uncomplicated: Secondary | ICD-10-CM | POA: Diagnosis present

## 2022-09-06 DIAGNOSIS — R799 Abnormal finding of blood chemistry, unspecified: Secondary | ICD-10-CM | POA: Insufficient documentation

## 2022-09-06 DIAGNOSIS — Z20822 Contact with and (suspected) exposure to covid-19: Secondary | ICD-10-CM | POA: Insufficient documentation

## 2022-09-06 DIAGNOSIS — R4182 Altered mental status, unspecified: Secondary | ICD-10-CM | POA: Insufficient documentation

## 2022-09-06 DIAGNOSIS — R451 Restlessness and agitation: Secondary | ICD-10-CM | POA: Diagnosis present

## 2022-09-06 DIAGNOSIS — F3113 Bipolar disorder, current episode manic without psychotic features, severe: Secondary | ICD-10-CM | POA: Diagnosis present

## 2022-09-06 LAB — CBC WITH DIFFERENTIAL/PLATELET
Abs Immature Granulocytes: 0.03 10*3/uL (ref 0.00–0.07)
Basophils Absolute: 0 10*3/uL (ref 0.0–0.1)
Basophils Relative: 0 %
Eosinophils Absolute: 0 10*3/uL (ref 0.0–0.5)
Eosinophils Relative: 0 %
HCT: 41.2 % (ref 39.0–52.0)
Hemoglobin: 14 g/dL (ref 13.0–17.0)
Immature Granulocytes: 0 %
Lymphocytes Relative: 8 %
Lymphs Abs: 1 10*3/uL (ref 0.7–4.0)
MCH: 30.6 pg (ref 26.0–34.0)
MCHC: 34 g/dL (ref 30.0–36.0)
MCV: 90 fL (ref 80.0–100.0)
Monocytes Absolute: 0.7 10*3/uL (ref 0.1–1.0)
Monocytes Relative: 6 %
Neutro Abs: 9.9 10*3/uL — ABNORMAL HIGH (ref 1.7–7.7)
Neutrophils Relative %: 86 %
Platelets: 226 10*3/uL (ref 150–400)
RBC: 4.58 MIL/uL (ref 4.22–5.81)
RDW: 13.3 % (ref 11.5–15.5)
WBC: 11.6 10*3/uL — ABNORMAL HIGH (ref 4.0–10.5)
nRBC: 0 % (ref 0.0–0.2)

## 2022-09-06 LAB — COMPREHENSIVE METABOLIC PANEL WITH GFR
ALT: 60 U/L — ABNORMAL HIGH (ref 0–44)
AST: 41 U/L (ref 15–41)
Albumin: 4.3 g/dL (ref 3.5–5.0)
Alkaline Phosphatase: 73 U/L (ref 38–126)
Anion gap: 8 (ref 5–15)
BUN: 10 mg/dL (ref 6–20)
CO2: 26 mmol/L (ref 22–32)
Calcium: 8.8 mg/dL — ABNORMAL LOW (ref 8.9–10.3)
Chloride: 104 mmol/L (ref 98–111)
Creatinine, Ser: 0.61 mg/dL (ref 0.61–1.24)
GFR, Estimated: >60 mL/min
Glucose, Bld: 109 mg/dL — ABNORMAL HIGH (ref 70–99)
Potassium: 3.7 mmol/L (ref 3.5–5.1)
Sodium: 138 mmol/L (ref 135–145)
Total Bilirubin: 0.4 mg/dL (ref 0.3–1.2)
Total Protein: 7.6 g/dL (ref 6.5–8.1)

## 2022-09-06 LAB — ACETAMINOPHEN LEVEL: Acetaminophen (Tylenol), Serum: <10 ug/mL — ABNORMAL LOW (ref 10–30)

## 2022-09-06 LAB — CBG MONITORING, ED: Glucose-Capillary: 119 mg/dL — ABNORMAL HIGH (ref 70–99)

## 2022-09-06 LAB — SALICYLATE LEVEL: Salicylate Lvl: <7 mg/dL — ABNORMAL LOW (ref 7.0–30.0)

## 2022-09-06 LAB — RAPID URINE DRUG SCREEN, HOSP PERFORMED
Amphetamines: NOT DETECTED
Barbiturates: NOT DETECTED
Benzodiazepines: NOT DETECTED
Cocaine: POSITIVE — AB
Opiates: NOT DETECTED
Tetrahydrocannabinol: NOT DETECTED

## 2022-09-06 LAB — ETHANOL: Alcohol, Ethyl (B): <10 mg/dL

## 2022-09-06 MED ORDER — DIPHENHYDRAMINE HCL 50 MG/ML IJ SOLN
50.0000 mg | Freq: Once | INTRAMUSCULAR | Status: AC
  Administered 2022-09-06: 50 mg via INTRAMUSCULAR
  Filled 2022-09-06: qty 1

## 2022-09-06 MED ORDER — STERILE WATER FOR INJECTION IJ SOLN
INTRAMUSCULAR | Status: AC
  Filled 2022-09-06: qty 10

## 2022-09-06 MED ORDER — ZIPRASIDONE MESYLATE 20 MG IM SOLR
10.0000 mg | Freq: Once | INTRAMUSCULAR | Status: AC
  Administered 2022-09-06: 10 mg via INTRAMUSCULAR
  Filled 2022-09-06: qty 20

## 2022-09-06 MED ORDER — MIDAZOLAM HCL 2 MG/2ML IJ SOLN
2.0000 mg | Freq: Once | INTRAMUSCULAR | Status: AC
  Administered 2022-09-06: 2 mg via INTRAMUSCULAR
  Filled 2022-09-06: qty 2

## 2022-09-06 MED ORDER — SODIUM CHLORIDE 0.9 % IV BOLUS
1000.0000 mL | Freq: Once | INTRAVENOUS | Status: AC
  Administered 2022-09-06: 1000 mL via INTRAVENOUS

## 2022-09-06 MED ORDER — NALOXONE HCL 4 MG/0.1ML NA LIQD
NASAL | Status: AC
  Filled 2022-09-06: qty 4

## 2022-09-06 NOTE — ED Notes (Signed)
Brief applied onto pt

## 2022-09-06 NOTE — ED Notes (Signed)
Bed linen changed brief put on and room mopped. Pt placed on cardiac monitoring.

## 2022-09-06 NOTE — ED Provider Notes (Signed)
[place] EMERGENCY DEPARTMENT AT [facility] Provider Note   CSN: [id number] Arrival date & time: 09/06/22  0546     History  Chief Complaint  Patient presents with   Altered Mental Status    [facility] is a 27 y.o. male.  The history is provided by the EMS personnel. The history is limited by the condition of the patient (Patient unresponsive).  Altered Mental Status      Home Medications Prior to Admission medications   Medication Sig Start Date End Date Taking? Authorizing Provider  amphetamine-dextroamphetamine (ADDERALL XR) 10 MG 24 hr capsule Take 10 mg by mouth daily.    [provider]  hydrOXYzine (VISTARIL) 25 MG capsule Take 25 mg by mouth 3 (three) times daily as needed for anxiety.    [provider]  ibuprofen (ADVIL) 200 MG tablet Take 600 mg by mouth as needed for headache or moderate pain.    [provider]  propranolol (INDERAL) 10 MG tablet Take 10 mg by mouth 3 (three) times daily.    [provider]  QUEtiapine (SEROQUEL) 25 MG tablet Take 1 tablet (25 mg total) by mouth daily. 02/23/22   Sheikh, Omair Latif, DO  QUEtiapine (SEROQUEL) 50 MG tablet Take 1 tablet (50 mg total) by mouth at bedtime. 02/22/22   Marguerita Merles Latif, DO  thiamine (VITAMIN B-1) 100 MG tablet Take 1 tablet (100 mg total) by mouth daily. 02/23/22   Marguerita Merles Latif, DO      Allergies    Fish allergy, Suboxone [buprenorphine hcl-naloxone hcl], and Penicillins    Review of Systems   Review of Systems  Unable to perform ROS: Patient unresponsive    Physical Exam Updated Vital Signs BP 118/74   Pulse 70   Temp 98.2 F (36.8 C) (Oral)   Resp 16   Wt 61 kg   SpO2 99%   BMI 22.38 kg/m  Physical Exam Vitals and nursing note reviewed.   27 year old male, resting comfortably and in no acute distress. Vital signs are normal. Oxygen saturation is 99%, which is normal. Head is normocephalic and atraumatic.   Pupils 5 mm and reactive. Oropharynx is clear. Neck is nontender and supple without adenopathy or JVD. Back is nontender and there is no CVA tenderness. Lungs are clear without rales, wheezes, or rhonchi. Chest is nontender. Heart has regular rate and rhythm without murmur. Abdomen is soft, flat, nontender. Extremities have no cyanosis or edema, full range of motion is present. Skin is warm and dry without rash. Neurologic: Responds to painful stimuli but not to voice, moves all extremities equally and with purpose.  ED Results / Procedures / Treatments   Labs (all labs ordered are listed, but only abnormal results are displayed) Labs Reviewed - No data to display  EKG None  Radiology No results found.  Procedures Procedures  Cardiac monitor shows normal sinus rhythm, per my interpretation.  Medications Ordered in ED Medications - No data to display  ED Course/ Medical Decision Making/ A&P                             Medical Decision Making Amount and/or Complexity of Data Reviewed Labs: ordered.  Risk Prescription drug management.   Altered mental status likely drug overdose.  With pupils fairly dilated, doubt opioid overdose.  Consider ethanol, benzodiazepine, other drug use.  No evidence of trauma.  I did order a  dose of intranasal naloxone which gave no response.  I have ordered laboratory workup of CBC, comprehensive metabolic panel, ethanol level, acetaminophen level, salicylate level, urine drug screen.  I reviewed his past records, and he was seen in the ED on 08/29/2022 for an opioid overdose.  Patient started becoming agitated, attempted to put him in restraints but were unsuccessful.  Patient got out of bed and defecated on the floor.  Even though he came in for altered mentation, he did require sedation to keep him controlled or we could observe him safely without threat to himself or staff.  I have ordered a dose of ziprasidone for sedation.  Drug screen is  positive for cocaine.  Other labs are pending.  Case is signed out to Dr. Anitra Lauth.  CRITICAL CARE Performed by: Dione Booze Total critical care time: 50 minutes Critical care time was exclusive of separately billable procedures and treating other patients. Critical care was necessary to treat or prevent imminent or life-threatening deterioration. Critical care was time spent personally by me on the following activities: development of treatment plan with patient and/or surrogate as well as nursing, discussions with consultants, evaluation of patient's response to treatment, examination of patient, obtaining history from patient or surrogate, ordering and performing treatments and interventions, ordering and review of laboratory studies, ordering and review of radiographic studies, pulse oximetry and re-evaluation of patient's condition.  Final Clinical Impression(s) / ED Diagnoses Final diagnoses:  Altered mental status, unspecified altered mental status type  Cocaine abuse    Rx / DC Orders ED Discharge Orders     None         Dione Booze, MD 09/06/22 [phone]

## 2022-09-06 NOTE — ED Notes (Signed)
Trialing removal of lower extremity restraints

## 2022-09-06 NOTE — ED Triage Notes (Signed)
Pt in via [facility], found unresponsive on the side of the road with a crack pipe by him on the ground. Initial CBG 49 per EMS, given 15g oral glucose en route. Pt awakens to loud noise and painful stimuli only. VS en route:  99/62 74HR [id number] [id number]

## 2022-09-06 NOTE — ED Provider Notes (Signed)
Assumed care of pt from Dr. Preston Fleeting at 7:00.  Pt has received geodon  for agitation due to his AMS and assumed accidental drug overdose.  Pt is positive for cocaine and blood sugar was normal.  Pt is currently in 4 pt restraints due to violent agitated behavior and not following commands.  Pt initially defecated on the floor he appears to have vomited multiple times in the bed.  Currently twitching and altered.  Will give versed for agitation and monitor closely allowing pt to metabolize.  Labs pending   2:08 PM I independently interpreted patient's labs, salicylates, acetaminophen, CMP and CBC all without acute findings.  After multiple evaluations, multiple IM meds patient is finally resting comfortably.  Will allow him to metabolize to where he is appropriate for discharge.  Or at least for reevaluation.  CRITICAL CARE Performed by: Couper Juncaj Total critical care time: 30 minutes Critical care time was exclusive of separately billable procedures and treating other patients. Critical care was necessary to treat or prevent imminent or life-threatening deterioration. Critical care was time spent personally by me on the following activities: development of treatment plan with patient and/or surrogate as well as nursing, discussions with consultants, evaluation of patient's response to treatment, examination of patient, obtaining history from patient or surrogate, ordering and performing treatments and interventions, ordering and review of laboratory studies, ordering and review of radiographic studies, pulse oximetry and re-evaluation of patient's condition.    Gwyneth Sprout, MD 09/11/22 [phone]

## 2022-09-06 NOTE — ED Notes (Signed)
4540: Pt thrashing himself in bed, restraints fixed. Pt continues to thrash himself in bed despite redirection. Pt has eyes closed, will occasionally stop but will go back. Pt has been told to relax, but pt continues to throw himself in bed

## 2022-09-06 NOTE — ED Notes (Signed)
Pt. Came in ams got up and peed and pooped on the floor and spitting and throwing snot, got back in the bed and turned to his side and pooped again on the floor and the bed.pt. started playing with himself. We redirected him and tried to place a brief on him and unsuccessful. Security helped place restraints on pt, had a mask on pt. Was still spitting on the floor. Techs bleached floor to clean up the mess as much as he would allow.

## 2022-09-06 NOTE — ED Notes (Signed)
Patient still aggitated   wrist and ankles straps were adjusted   patient was able to use his mouth to unclip one hand

## 2022-09-06 NOTE — ED Notes (Signed)
Patient given IM benedryl    he is still quite aggitated

## 2022-09-06 NOTE — ED Notes (Signed)
Unable to obtain vitals due to patient not cooperating and still restless

## 2022-09-06 NOTE — ED Provider Notes (Signed)
Care of patient received from prior provider at 3:14 PM, please see their note for complete H/P and care plan.  Received handoff per ED course.  Clinical Course as of 09/06/22 1514  Thu Sep 06, 2022  1513 Found at 9PM altered on side of the road last night Now metabolizing. GCS improving  Reassess.  [CC]    Clinical Course User Index [CC] Glyn Ade, MD    Reassessment: On reassessment he is GCS 15 and resting. He states that he does not want to talk and just wants to die. He is flat in affect and behaving aggressively. Concern for underlying psychosis. Observed in ER for extended time period and based on clinical exam he is medically cleared for psychiatric evaluation.  Given his extensive psychiatric history, will involve psychiatric team for assessment of his suicidal ideas.     Glyn Ade, MD 09/07/22 [phone]

## 2022-09-06 NOTE — ED Notes (Addendum)
Pt being combative with techs and nurses at bedside. RN trying to get an IV for blood work but was not able to. Pt stands up at bedside and starts to urinate and pass feces. Pt also jerks off and spits after getting back in bed. Feces was cleaned off of floor and pt decides to pass feces again in bed. Security at bedside while violent restraints were placed.

## 2022-09-06 NOTE — ED Notes (Signed)
Pt peeing all over the floor, despite having a brief on. Pt CAN verbalize when he has to pee, pt educated in calling out when to pee. Pt ignoring Clinical research associate and continues to thrash himself around the bed

## 2022-09-06 NOTE — ED Notes (Signed)
Patient is still aggitated   not quite as bad as before but still very restless and trying to get out of the restraints

## 2022-09-06 NOTE — ED Notes (Signed)
Patient is still aggitated    giving the versed more time to work   attempt to get patient cleaned up and linens changed   security is being called for assisitance

## 2022-09-07 DIAGNOSIS — F3113 Bipolar disorder, current episode manic without psychotic features, severe: Secondary | ICD-10-CM

## 2022-09-07 LAB — RESP PANEL BY RT-PCR (RSV, FLU A&B, COVID)  RVPGX2
Influenza A by PCR: NEGATIVE
Influenza B by PCR: NEGATIVE
Resp Syncytial Virus by PCR: NEGATIVE
SARS Coronavirus 2 by RT PCR: NEGATIVE

## 2022-09-07 MED ORDER — QUETIAPINE FUMARATE 25 MG PO TABS
25.0000 mg | ORAL_TABLET | Freq: Every day | ORAL | Status: DC
  Administered 2022-09-07 – 2022-09-09 (×3): 25 mg via ORAL
  Filled 2022-09-07 (×3): qty 1

## 2022-09-07 MED ORDER — HYDROXYZINE PAMOATE 25 MG PO CAPS
25.0000 mg | ORAL_CAPSULE | Freq: Three times a day (TID) | ORAL | Status: DC | PRN
  Filled 2022-09-07: qty 1

## 2022-09-07 MED ORDER — QUETIAPINE FUMARATE 50 MG PO TABS
50.0000 mg | ORAL_TABLET | Freq: Every day | ORAL | Status: DC
  Administered 2022-09-07 – 2022-09-08 (×2): 50 mg via ORAL
  Filled 2022-09-07 (×2): qty 1

## 2022-09-07 MED ORDER — PROPRANOLOL HCL 20 MG PO TABS
10.0000 mg | ORAL_TABLET | Freq: Three times a day (TID) | ORAL | Status: DC
  Administered 2022-09-07 – 2022-09-09 (×6): 10 mg via ORAL
  Filled 2022-09-07 (×6): qty 1

## 2022-09-07 NOTE — ED Notes (Signed)
Pt refused to engage in TTS assessment. Consulting civil engineer notified.

## 2022-09-07 NOTE — BH Assessment (Signed)
Clinician attempted to complete TTS assessment with patient, however patient closed his eyes and refused to answer questions. NT also tried to get patient to engage, however patient refused.  Manfred Arch, MSW, LCSW Triage Specialist

## 2022-09-07 NOTE — ED Notes (Signed)
Provided pt with burgundy scrubs and instructed him to change into them. Pt unwilling to do so. Off duty [employer] called to bedside to assist in convincing pt to dress out. Pt is able to stand from bed and put on scrubs without assistance. Consulting civil engineer notified.

## 2022-09-07 NOTE — ED Notes (Signed)
Patient has been alert this shift. Patient has been sleeping most of the shift. Patient quiet and guarded. Patient medication compliant. No suicidal ideation noted. No homicidal ideation noted.

## 2022-09-07 NOTE — Progress Notes (Signed)
LCSW Progress Note  [id number]   [facility] Ilda Mori  09/07/2022  3:13 PM  Description:   Inpatient Psychiatric Referral  Patient was recommended inpatient per [id number], PMHNP. There are no available beds at [facility]. Patient was referred to the following facilities:   Destination  Service Provider Address Phone Fax  [facility]  [address] [place], [place] [address] [phone]  [facility] [address] [place]  [address] [place] [address] [phone]  [facility]  [address] [place], [place] [address] [phone]  [facility]  [address] [place] [address] [phone]  [facility]  [address] [place] [address] [phone]  [facility]  [address] [place] [address] [phone]  [facility]  [address] [place], [place] [address] [phone]  [facility]  [address] [place] [address] [phone]  [facility]  [address] [place] [address] [phone]  [facility]  [address] [place] [address] [phone]  [facility]  [address] [place] [address] [phone]  [facility]  [address] [place] [address] [phone]  [facility]  [address] [place] [address] [phone]  [facility]  [address], [place] [address] [phone]  [facility]  [address] [place] [address] [phone]  [facility]  [address]  [place] [address] [phone]  [facility]  [address] [place] [address] [phone]  [facility]  [address] [place] [address] [phone]  [facility]  [address], [place] [address] [phone]  [facility]  [address] [place] [address] [phone]  [facility]  [address] [place] [address] [phone]  [facility]  [address] [place] [address] [phone]  [facility]  [address] [place] [address] [phone]  [facility] [address]  [address], [place] [address] [phone]    Situation ongoing, CSW to continue following and update chart as more information becomes available.      Cathie Beams, [place]  09/07/2022 3:13 PM

## 2022-09-07 NOTE — ED Notes (Signed)
TTS previously asked if pt was able to participate in assessment. This writer advised that pt is asleep.  Pt is now awake and requesting something to eat. Provided pt with sandwich and water. Notified TTS that pt is now awake.

## 2022-09-07 NOTE — Progress Notes (Addendum)
Pt is under review for inpatient behavioral health placement by [facility] PENDING the following: IVC paperwork, updated EKG, VS, and verbal RN report update  CSW received a phone call from [facility], Intake Eunice Blase [phone] requesting the following information so that pt can be presented for review to [facility]. CSW inquired what RN was covering to assist with the following request. CSW communicated with care team with the assistance of psych provider Alona Bene, PMHNP who informed that the covering nurse is Alfonzo Feller, Charity fundraiser.  This CSW requested assistance with the following per the request of [facility]: IVC paperwork to be faxed to [phone], updated EKG and VS, plus verbal RN update with Eunice Blase [phone]. Assigned Nurse Alfonzo Feller, RN agreed to assist with the listed PENDING items. Per Alfonzo Feller, RN pt is not currently under an IVC. Pt meets inpatient behavioral health placement per Alona Bene, PMHNP. CSW will contuine to assist and follow with inpatient behavioral health placement.    Maryjean Ka, MSW, [facility] 09/07/2022 10:53 PM

## 2022-09-07 NOTE — ED Provider Notes (Signed)
Emergency Medicine Observation Re-evaluation Note  [facility] Joseph Nunez is a 27 y.o. male, seen on rounds today.  Pt initially presented to the ED for complaints of Altered Mental Status Currently, the patient is awaiting TTS evaluation.  Patient alert and cooperative at this time.  Physical Exam  BP 112/70 (BP Location: Left Arm)   Pulse 66   Temp 98.3 F (36.8 C) (Oral)   Resp 20   Ht 1.651 m ( )   Wt 61.2 kg   SpO2 100%   BMI 22.47 kg/m  Physical Exam   ED Course / MDM  EKG:   I have reviewed the labs performed to date as well as medications administered while in observation.  Recent changes in the last 24 hours include patient agitated behavior which is since improved.  Plan  Current plan is for psychiatry evaluation.    Lorre Nick, MD 09/07/22 [phone]

## 2022-09-07 NOTE — BH Assessment (Signed)
Clinician reached out to RN Raelene Bott to complete TTS assessment. Per RN Morrie Sheldon, patient is sleeping and based on previous agitation/behaviors, best for patient to remain sleeping at this time. Clinician requested to be notified if patient wakes and is able to engage.  Manfred Arch, MSW, LCSW Triage Specialist

## 2022-09-07 NOTE — ED Notes (Signed)
TTS cart at bedside. 

## 2022-09-07 NOTE — Consult Note (Signed)
[facility] ED ASSESSMENT   Reason for Consult:  Psych Consult Referring Physician:  Dr. Doran Durand  Patient Identification: Joseph Nunez MRN:  [id number] ED Chief Complaint: Bipolar I disorder, current or most recent episode manic, severe  Diagnosis:  Principal Problem:   Bipolar I disorder, current or most recent episode manic, severe Active Problems:   Cocaine use disorder   Agitation   ED Assessment Time Calculation: Start Time: 1130 Stop Time: 1210 Total Time in Minutes (Assessment Completion): 40    HPI: Per Triage Note: Pt in via [facility], found unresponsive on the side of the road with a crack pipe by him on the ground. Initial CBG 49 per EMS, given 15g oral glucose en route. Pt awakens to loud noise and painful stimuli only.    Subjective:  Joseph Nunez, 27 y.o., male patient seen face to face by this provider, consulted with Dr. Lucianne Muss; and chart reviewed on 09/07/22.  On evaluation Joseph Nunez is in an irritable mood states that he does not want any substance abuse treatment, says he is taking his medications and then asked provider if we have heard from his mother.  Provider told him now we have not heard from her, asked patient if he still lives with her and he said he did not now turned his back on provider and would not answer any more questions.  Patient denies HI/AVH, but would not answer the question to whether he wanted to hurt himself or not.  The patient will not answer any questions about drug usage, but UDS was positive for cocaine.  BAL less than 10. Patient continues to minimize substance use throughout this psychiatric evaluation.  He demonstrates no insight into the negative consequences of his substance use.     During evaluation Joseph Nunez is laying in his bed with his head under his blanket in no acute distress. He is alert, oriented x 3. Presents with symptoms of paranoia, irritability. He has normal speech.   Objectively there is no evidence of psychosis/mania or delusional thinking.  Patient is able to converse coherently, no distractibility. He denies homicidal ideation, psychosis, and paranoia.  Patient did not answer when asked about suicidal thoughts and wanting to harm himself, he just rolled over in bed and covered his head up with his blanket.  Patient is a 27 year old male with a history of polysubstance use, and substance induced mood disorder, patient continues to minimize his substance use and has no insight into the consequences of his substance use, patient denies using any substances and any recent psychotic symptoms.   Past Psychiatric History: polysubstance use, substance induced mood disorder vs BiPD, chronic HCV   Risk to Self:  Passive SI  Risk to Others:  No  Prior Inpatient Therapy:  Yes  Prior Outpatient Therapy:  No    [place] Scale:  Flowsheet Row ED from [facility]06[facility]20[facility] in [facility] ED from [facility]/29/20[facility] in [facility] ED from 06/26/2022 in [facility] at [facility]  C-SSRS RISK CATEGORY No Risk No Risk No Risk       AIMS:  , , ,  ,   ASAM:    Substance Abuse:     Past Medical History:  Past Medical History:  Diagnosis Date   Cocaine abuse    Heroin abuse    IVDU (intravenous drug user)    MRSA (  methicillin resistant staph aureus) culture positive    Substance abuse    Tobacco abuse     Past Surgical History:  Procedure Laterality Date   IRRIGATION AND DEBRIDEMENT ABSCESS N/A 03/08/2017   Procedure: IRRIGATION AND DEBRIDEMENT LEFT CHEST ABSCESS;  Surgeon: Karie Soda, MD;  Location: [facility] ORS;  Service: General;  Laterality: N/A;   TEE WITHOUT CARDIOVERSION N/A 03/08/2017   Procedure: TRANSESOPHAGEAL ECHOCARDIOGRAM (TEE);  Surgeon: Chilton Si, MD;  Location: [facility] ENDOSCOPY;  Service: Cardiovascular;  Laterality: N/A;   Family History:   Family History  Problem Relation Age of Onset   Drug abuse Mother    Heart disease Mother    Diabetes Mellitus II Sister     Social History:  Social History   Substance and Sexual Activity  Alcohol Use Yes     Social History   Substance and Sexual Activity  Drug Use Yes   Types: Amphetamines, Fentanyl, IV, Marijuana, MDMA (Ecstacy), Benzodiazepines, "Crack" cocaine, Heroin, Cocaine, Methamphetamines   Comment: heroin,     Social History   Socioeconomic History   Marital status: Single    Spouse name: Not on file   Number of children: Not on file   Years of education: Not on file   Highest education level: High school graduate  Occupational History   Not on file  Tobacco Use   Smoking status: Every Day    Packs/day: .25    Types: Cigarettes   Smokeless tobacco: Never  Vaping Use   Vaping Use: Never used  Substance and Sexual Activity   Alcohol use: Yes   Drug use: Yes    Types: Amphetamines, Fentanyl, IV, Marijuana, MDMA (Ecstacy), Benzodiazepines, "Crack" cocaine, Heroin, Cocaine, Methamphetamines    Comment: heroin,    Sexual activity: Not Currently  Other Topics Concern   Not on file  Social History Narrative   ** Merged History Encounter **       Social Determinants of Health   Financial Resource Strain: Not on file  Food Insecurity: Food Insecurity Present (06/23/2022)   Hunger Vital Sign    Worried About Running Out of Food in the Last Year: Often true    Ran Out of Food in the Last Year: Often true  Transportation Needs: Unmet Transportation Needs (06/23/2022)   PRAPARE - Administrator, Civil Service (Medical): Yes    Lack of Transportation (Non-Medical): Yes  Physical Activity: Not on file  Stress: Not on file  Social Connections: Not on file     Allergies:   Allergies  Allergen Reactions   Fish Allergy Hives    Catfish   Suboxone [Buprenorphine Hcl-Naloxone Hcl] Anaphylaxis   Penicillins Rash    Has patient had a PCN reaction  causing immediate rash, facial/tongue/throat swelling, SOB or lightheadedness with hypotension:yHas patient had a PCN reace cassing severe rash involving mucus membranes or skin necrosis: no Has patient had a PCN reaction that required hospitalization yes Has patient had a PCN reaction occurring within the last 10 years: no If all of the above answers are "NO", then may proceed with Cephalosporin use.     Labs:  Results for orders placed or performed during the hospital encounter of 09/06/22 (from the past 48 hour(s))  CBG monitoring, ED     Status: Abnormal   Collection Time: 09/06/22  5:55 AM  Result Value Ref Range   Glucose-Capillary 119 (H) 70 - 99 mg/dL    Comment: Glucose reference range applies only to samples taken after fasting for  at least 8 hours.  Urine rapid drug screen (hosp performed)     Status: Abnormal   Collection Time: 09/06/22  6:23 AM  Result Value Ref Range   Opiates NONE DETECTED NONE DETECTED   Cocaine POSITIVE (A) NONE DETECTED   Benzodiazepines NONE DETECTED NONE DETECTED   Amphetamines NONE DETECTED NONE DETECTED   Tetrahydrocannabinol NONE DETECTED NONE DETECTED   Barbiturates NONE DETECTED NONE DETECTED    Comment: (NOTE) DRUG SCREEN FOR MEDICAL PURPOSES ONLY.  IF CONFIRMATION IS NEEDED FOR ANY PURPOSE, NOTIFY LAB WITHIN 5 DAYS.  LOWEST DETECTABLE LIMITS FOR URINE DRUG SCREEN Drug Class                     Cutoff (ng/mL) Amphetamine and metabolites    1000 Barbiturate and metabolites    200 Benzodiazepine                 200 Opiates and metabolites        300 Cocaine and metabolites        300 THC                            50 Performed at [facility], [address] [place], [place] [address]   Salicylate level     Status: Abnormal   Collection Time: 09/06/22 12:52 PM  Result Value Ref Range   Salicylate Lvl <7.0 (L) 7.0 - 30.0 mg/dL    Comment: Performed at [facility], [address],  [place], [place] [address]  Acetaminophen level     Status: Abnormal   Collection Time: 09/06/22 12:52 PM  Result Value Ref Range   Acetaminophen (Tylenol), Serum <10 (L) 10 - 30 ug/mL    Comment: (NOTE) Therapeutic concentrations vary significantly. A range of 10-30 ug/mL  may be an effective concentration for many patients. However, some  are best treated at concentrations outside of this range. Acetaminophen concentrations >150 ug/mL at 4 hours after ingestion  and >50 ug/mL at 12 hours after ingestion are often associated with  toxic reactions.  Performed at [facility], [address], [place], [place] [address]   Comprehensive metabolic panel     Status: Abnormal   Collection Time: 09/06/22 12:52 PM  Result Value Ref Range   Sodium 138 135 - 145 mmol/L   Potassium 3.7 3.5 - 5.1 mmol/L   Chloride 104 98 - 111 mmol/L   CO2 26 22 - 32 mmol/L   Glucose, Bld 109 (H) 70 - 99 mg/dL    Comment: Glucose reference range applies only to samples taken after fasting for at least 8 hours.   BUN 10 6 - 20 mg/dL   Creatinine, Ser 2.13 0.61 - 1.24 mg/dL   Calcium 8.8 (L) 8.9 - 10.3 mg/dL   Total Protein 7.6 6.5 - 8.1 g/dL   Albumin 4.3 3.5 - 5.0 g/dL   AST 41 15 - 41 U/L   ALT 60 (H) 0 - 44 U/L   Alkaline Phosphatase 73 38 - 126 U/L   Total Bilirubin 0.4 0.3 - 1.2 mg/dL   GFR, Estimated >08 >65 mL/min    Comment: (NOTE) Calculated using the CKD-EPI Creatinine Equation (2021)    Anion gap 8 5 - 15    Comment: Performed at [facility], [address], [place], [place] [address]  Ethanol     Status: None   Collection Time: 09/06/22  12:52 PM  Result Value Ref Range   Alcohol, Ethyl (B) <10 <10 mg/dL    Comment: (NOTE) Lowest detectable limit for serum alcohol is 10 mg/dL.  For medical purposes only. Performed at [facility], [address], [place], [place] [address]   CBC with Differential     Status: Abnormal   Collection  Time: 09/06/22 12:52 PM  Result Value Ref Range   WBC 11.6 (H) 4.0 - 10.5 K/uL   RBC 4.58 4.22 - 5.81 MIL/uL   Hemoglobin 14.0 13.0 - 17.0 g/dL   HCT 96.2 95.2 - 84.1 %   MCV 90.0 80.0 - 100.0 fL   MCH 30.6 26.0 - 34.0 pg   MCHC 34.0 30.0 - 36.0 g/dL   RDW 32.4 40.1 - 02.7 %   Platelets 226 150 - 400 K/uL   nRBC 0.0 0.0 - 0.2 %   Neutrophils Relative % 86 %   Neutro Abs 9.9 (H) 1.7 - 7.7 K/uL   Lymphocytes Relative 8 %   Lymphs Abs 1.0 0.7 - 4.0 K/uL   Monocytes Relative 6 %   Monocytes Absolute 0.7 0.1 - 1.0 K/uL   Eosinophils Relative 0 %   Eosinophils Absolute 0.0 0.0 - 0.5 K/uL   Basophils Relative 0 %   Basophils Absolute 0.0 0.0 - 0.1 K/uL   Immature Granulocytes 0 %   Abs Immature Granulocytes 0.03 0.00 - 0.07 K/uL    Comment: Performed at [facility], [address], [place], [place] [address]    No current facility-administered medications for this encounter.   Current Outpatient Medications  Medication Sig Dispense Refill   amphetamine-dextroamphetamine (ADDERALL XR) 10 MG 24 hr capsule Take 10 mg by mouth daily.     hydrOXYzine (VISTARIL) 25 MG capsule Take 25 mg by mouth 3 (three) times daily as needed for anxiety.     ibuprofen (ADVIL) 200 MG tablet Take 600 mg by mouth as needed for headache or moderate pain.     propranolol (INDERAL) 10 MG tablet Take 10 mg by mouth 3 (three) times daily.     QUEtiapine (SEROQUEL) 25 MG tablet Take 1 tablet (25 mg total) by mouth daily. 30 tablet 0   QUEtiapine (SEROQUEL) 50 MG tablet Take 1 tablet (50 mg total) by mouth at bedtime. 30 tablet 0   thiamine (VITAMIN B-1) 100 MG tablet Take 1 tablet (100 mg total) by mouth daily. 30 tablet 0    Musculoskeletal:  Patient observed resting in bed.    Psychiatric Specialty Exam: Presentation  General Appearance:  Disheveled  Eye Contact: Minimal  Speech: Clear and Coherent  Speech Volume: Normal  Handedness: Right   Mood and Affect   Mood: Irritable  Affect: Flat   Thought Process  Thought Processes: Coherent  Descriptions of Associations:Intact  Orientation:Full (Time, Place and Person)  Thought Content:WDL  History of Schizophrenia/Schizoaffective disorder:No data recorded Duration of Psychotic Symptoms:No data recorded Hallucinations:Hallucinations: None  Ideas of Reference:None  Suicidal Thoughts:Suicidal Thoughts: Yes, Passive  Homicidal Thoughts:Homicidal Thoughts: No   Sensorium  Memory: Immediate Fair; Recent Fair  Judgment: Impaired  Insight: Lacking   Executive Functions  Concentration: Fair  Attention Span: Fair  Recall: [employer] of Knowledge: Fair  Language: Fair   Psychomotor Activity  Psychomotor Activity: Psychomotor Activity: Normal   Assets  Assets: Communication Skills; Social Support    Sleep  Sleep: Sleep: Fair   Physical Exam: Physical Exam Vitals and nursing note reviewed. Exam conducted with a  chaperone present.  Pulmonary:     Effort: Pulmonary effort is normal.  Neurological:     Mental Status: He is alert.  Psychiatric:        Attention and Perception: Attention normal.        Mood and Affect: Affect is flat and angry.        Speech: Speech normal.        Behavior: Behavior is agitated.        Thought Content: Thought content includes suicidal ideation.        Judgment: Judgment is inappropriate.    Review of Systems  Constitutional: Negative.   HENT: Negative.    Respiratory: Negative.    Psychiatric/Behavioral:  Positive for depression, substance abuse and suicidal ideas.    Blood pressure 114/73, pulse (!) 101, temperature 99.2 F (37.3 C), temperature source Oral, resp. rate 20, height  (1.651 m), weight 61.2 kg, SpO2 100 %. Body mass index is 22.47 kg/m.    Medical Decision Making: Patient case review and discussed with Dr. Lucianne Muss. Patient needs inpatient psychiatric admission for stabilization and  treatment. Attempted to call patient mother Aura Fey [phone], located in demographics, number not in service. Will restart patient home medications.     Disposition: Recommend psychiatric Inpatient admission when medically cleared.   Yuridiana Formanek MOTLEY-MANGRUM, PMHNP 09/07/2022 2:02 PM

## 2022-09-08 MED ORDER — NICOTINE 21 MG/24HR TD PT24
21.0000 mg | MEDICATED_PATCH | Freq: Every day | TRANSDERMAL | Status: DC
  Administered 2022-09-08 – 2022-09-09 (×2): 21 mg via TRANSDERMAL
  Filled 2022-09-08 (×2): qty 1

## 2022-09-08 MED ORDER — HYDROXYZINE HCL 25 MG PO TABS
25.0000 mg | ORAL_TABLET | Freq: Three times a day (TID) | ORAL | Status: DC | PRN
  Administered 2022-09-09: 25 mg via ORAL
  Filled 2022-09-08: qty 1

## 2022-09-08 NOTE — Progress Notes (Signed)
[facility] Psych ED Progress Note  09/08/2022 8:58 PM [facility] Joseph Nunez  MRN:  [id number]    Principal Problem: Bipolar I disorder, current or most recent episode manic, severe Diagnosis:  Principal Problem:   Bipolar I disorder, current or most recent episode manic, severe Active Problems:   Cocaine use disorder   Agitation   ED Assessment Time Calculation: Start Time: 1130 Stop Time: 1210 Total Time in Minutes (Assessment Completion): 40   Subjective: On evaluation today, the patient is laying in his bed, watching TV in no acute distress. He is calm and cooperative during this assessment. His appearance is appropriate for environment. His eye contact is good.  Speech is clear and coherent, normal pace and normal volume. He reports his mood is good and he feels he is ready to go home.  Affect is congruent with mood.  Thought process is coherent.  Thought content is within normal limits.  He denies auditory and visual hallucinations.  No indication that he is responding to internal stimuli during this assessment. No delusions elicited during this assessment.  He denies suicidal ideations. He denies homicidal ideations.  Patient continues to ask provider if we have reached his mother, states that he is ready to go home and he has been to go stay with his mom.  Patient able to discuss his admission, states that he apologizes for his behavior.  Per chart review patient did come out from today and yell out at RN, but the patient was redirectable.  Support, encouragement and reassurance provided about ongoing stressors and patient provided with opportunity for questions.      Past Psychiatric History: polysubstance use, substance induced mood disorder vs BiPD, chronic HCV     [place] Scale:  Flowsheet Row ED from 09/06/2022 in [facility] Emergency Department at [facility] ED from 08/29/2022 in [facility] Emergency Department at [facility] ED from 06/26/2022 in [facility]  Emergency Department at [facility]  C-SSRS RISK CATEGORY No Risk No Risk No Risk       Past Medical History:  Past Medical History:  Diagnosis Date   Cocaine abuse    Heroin abuse    IVDU (intravenous drug user)    MRSA (methicillin resistant staph aureus) culture positive    Substance abuse    Tobacco abuse     Past Surgical History:  Procedure Laterality Date   IRRIGATION AND DEBRIDEMENT ABSCESS N/A 03/08/2017   Procedure: IRRIGATION AND DEBRIDEMENT LEFT CHEST ABSCESS;  Surgeon: Karie Soda, MD;  Location: [facility] ORS;  Service: General;  Laterality: N/A;   TEE WITHOUT CARDIOVERSION N/A 03/08/2017   Procedure: TRANSESOPHAGEAL ECHOCARDIOGRAM (TEE);  Surgeon: Chilton Si, MD;  Location: [facility] ENDOSCOPY;  Service: Cardiovascular;  Laterality: N/A;   Family History:  Family History  Problem Relation Age of Onset   Drug abuse Mother    Heart disease Mother    Diabetes Mellitus II Sister    Social History:  Social History   Substance and Sexual Activity  Alcohol Use Yes     Social History   Substance and Sexual Activity  Drug Use Yes   Types: Amphetamines, Fentanyl, IV, Marijuana, MDMA (Ecstacy), Benzodiazepines, "Crack" cocaine, Heroin, Cocaine, Methamphetamines   Comment: heroin,     Social History   Socioeconomic History   Marital status: Single    Spouse name: Not on file   Number of children: Not on file   Years of education: Not on file   Highest education level:  High school graduate  Occupational History   Not on file  Tobacco Use   Smoking status: Every Day    Packs/day: .25    Types: Cigarettes   Smokeless tobacco: Never  Vaping Use   Vaping Use: Never used  Substance and Sexual Activity   Alcohol use: Yes   Drug use: Yes    Types: Amphetamines, Fentanyl, IV, Marijuana, MDMA (Ecstacy), Benzodiazepines, "Crack" cocaine, Heroin, Cocaine, Methamphetamines    Comment: heroin,    Sexual activity: Not Currently  Other Topics Concern   Not on  file  Social History Narrative   ** Merged History Encounter **       Social Determinants of Health   Financial Resource Strain: Not on file  Food Insecurity: Food Insecurity Present (06/23/2022)   Hunger Vital Sign    Worried About Running Out of Food in the Last Year: Often true    Ran Out of Food in the Last Year: Often true  Transportation Needs: Unmet Transportation Needs (06/23/2022)   PRAPARE - Administrator, Civil Service (Medical): Yes    Lack of Transportation (Non-Medical): Yes  Physical Activity: Not on file  Stress: Not on file  Social Connections: Not on file    Sleep: Fair  Appetite:  Good  Current Medications: Current Facility-Administered Medications  Medication Dose Route Frequency Provider Last Rate Last Admin   hydrOXYzine (VISTARIL) capsule 25 mg  25 mg Oral TID PRN Motley-Mangrum, Kern Gingras A, PMHNP       nicotine (NICODERM CQ - dosed in mg/24 hours) patch 21 mg  21 mg Transdermal Daily Vivien Rossetti C, MD   21 mg at 09/08/22 1539   propranolol (INDERAL) tablet 10 mg  10 mg Oral TID Motley-Mangrum, Arman Loy A, PMHNP   10 mg at 09/08/22 1539   QUEtiapine (SEROQUEL) tablet 25 mg  25 mg Oral Daily Motley-Mangrum, Zeev Deakins A, PMHNP   25 mg at 09/08/22 0917   QUEtiapine (SEROQUEL) tablet 50 mg  50 mg Oral QHS Motley-Mangrum, Marion Rosenberry A, PMHNP   50 mg at 09/07/22 2129   Current Outpatient Medications  Medication Sig Dispense Refill   amphetamine-dextroamphetamine (ADDERALL XR) 10 MG 24 hr capsule Take 10 mg by mouth daily.     hydrOXYzine (VISTARIL) 25 MG capsule Take 25 mg by mouth 3 (three) times daily as needed for anxiety.     ibuprofen (ADVIL) 200 MG tablet Take 600 mg by mouth as needed for headache or moderate pain.     propranolol (INDERAL) 10 MG tablet Take 10 mg by mouth 3 (three) times daily.     QUEtiapine (SEROQUEL) 25 MG tablet Take 1 tablet (25 mg total) by mouth daily. 30 tablet 0   QUEtiapine (SEROQUEL) 50 MG tablet Take 1 tablet (50 mg  total) by mouth at bedtime. 30 tablet 0   thiamine (VITAMIN B-1) 100 MG tablet Take 1 tablet (100 mg total) by mouth daily. 30 tablet 0    Lab Results:  Results for orders placed or performed during the hospital encounter of 09/06/22 (from the past 48 hour(s))  Resp panel by RT-PCR (RSV, Flu A&B, Covid) Anterior Nasal Swab     Status: None   Collection Time: 09/07/22  7:51 PM   Specimen: Anterior Nasal Swab  Result Value Ref Range   SARS Coronavirus 2 by RT PCR NEGATIVE NEGATIVE    Comment: (NOTE) SARS-CoV-2 target nucleic acids are NOT DETECTED.  The SARS-CoV-2 RNA is generally detectable in upper respiratory specimens during the acute  phase of infection. The lowest concentration of SARS-CoV-2 viral copies this assay can detect is 138 copies/mL. A negative result does not preclude SARS-Cov-2 infection and should not be used as the sole basis for treatment or other patient management decisions. A negative result may occur with  improper specimen collection/handling, submission of specimen other than nasopharyngeal swab, presence of viral mutation(s) within the areas targeted by this assay, and inadequate number of viral copies(<138 copies/mL). A negative result must be combined with clinical observations, patient history, and epidemiological information. The expected result is Negative.  Fact Sheet for Patients:  BloggerCourse.com  Fact Sheet for Healthcare Providers:  SeriousBroker.it  This test is no t yet approved or cleared by the [place] FDA and  has been authorized for detection and/or diagnosis of SARS-CoV-2 by FDA under an Emergency Use Authorization (EUA). This EUA will remain  in effect (meaning this test can be used) for the duration of the COVID-19 declaration under Section 564(b)(1) of the Act, 21 U.S.C.section 360bbb-3(b)(1), unless the authorization is terminated  or revoked sooner.       Influenza A by  PCR NEGATIVE NEGATIVE   Influenza B by PCR NEGATIVE NEGATIVE    Comment: (NOTE) The Xpert Xpress SARS-CoV-2/FLU/RSV plus assay is intended as an aid in the diagnosis of influenza from Nasopharyngeal swab specimens and should not be used as a sole basis for treatment. Nasal washings and aspirates are unacceptable for Xpert Xpress SARS-CoV-2/FLU/RSV testing.  Fact Sheet for Patients: BloggerCourse.com  Fact Sheet for Healthcare Providers: SeriousBroker.it  This test is not yet approved or cleared by the [place] FDA and has been authorized for detection and/or diagnosis of SARS-CoV-2 by FDA under an Emergency Use Authorization (EUA). This EUA will remain in effect (meaning this test can be used) for the duration of the COVID-19 declaration under Section 564(b)(1) of the Act, 21 U.S.C. section 360bbb-3(b)(1), unless the authorization is terminated or revoked.     Resp Syncytial Virus by PCR NEGATIVE NEGATIVE    Comment: (NOTE) Fact Sheet for Patients: BloggerCourse.com  Fact Sheet for Healthcare Providers: SeriousBroker.it  This test is not yet approved or cleared by the [place] FDA and has been authorized for detection and/or diagnosis of SARS-CoV-2 by FDA under an Emergency Use Authorization (EUA). This EUA will remain in effect (meaning this test can be used) for the duration of the COVID-19 declaration under Section 564(b)(1) of the Act, 21 U.S.C. section 360bbb-3(b)(1), unless the authorization is terminated or revoked.  Performed at [facility], [address] [place], [place] [address]     Blood Alcohol level:  Lab Results  Component Value Date   [facility] <10 09/06/2022   ETH <10 08/29/2022    Physical Findings:  CIWA:    COWS:     Musculoskeletal: Strength & Muscle Tone: within normal limits Gait & Station: normal Patient leans:  N/A  Psychiatric Specialty Exam:  Presentation  General Appearance:  Disheveled  Eye Contact: Minimal  Speech: Clear and Coherent  Speech Volume: Normal  Handedness: Right   Mood and Affect  Mood: Irritable  Affect: Flat   Thought Process  Thought Processes: Coherent  Descriptions of Associations:Intact  Orientation:Full (Time, Place and Person)  Thought Content:WDL  History of Schizophrenia/Schizoaffective disorder:No data recorded Duration of Psychotic Symptoms:No data recorded Hallucinations:Hallucinations: None  Ideas of Reference:None  Suicidal Thoughts:Suicidal Thoughts: Yes, Passive  Homicidal Thoughts:Homicidal Thoughts: No   Sensorium  Memory: Immediate Fair; Recent Fair  Judgment: Impaired  Insight: Lacking  Executive Functions  Concentration: Fair  Attention Span: Fair  Recall: [employer] of Knowledge: Fair  Language: Fair   Psychomotor Activity  Psychomotor Activity: Psychomotor Activity: Normal   Assets  Assets: Manufacturing systems engineer; Social Support   Sleep  Sleep: Sleep: Fair    Physical Exam: Physical Exam Eyes:     Pupils: Pupils are equal, round, and reactive to light.  Musculoskeletal:        General: Normal range of motion.     Cervical back: Normal range of motion.  Neurological:     Mental Status: He is alert.  Psychiatric:        Mood and Affect: Mood is anxious.        Speech: Speech normal.        Behavior: Behavior is hyperactive. Behavior is cooperative.        Thought Content: Thought content normal.        Cognition and Memory: Memory normal.        Judgment: Judgment is impulsive.    Review of Systems  Constitutional: Negative.   HENT: Negative.    Respiratory: Negative.    Musculoskeletal: Negative.   Psychiatric/Behavioral:  Positive for substance abuse.    Blood pressure 101/76, pulse 91, temperature 98.6 F (37 C), temperature source Oral, resp. rate 18, height 5'  5" (1.651 m), weight 61.2 kg, SpO2 99 %. Body mass index is 22.47 kg/m.   Medical Decision Making: Inpatient psych recommended, will attempt to call his mother again for collateral.  Patient is ready to be discharged, denies wanting any substance abuse treatment.   Ein Rijo MOTLEY-MANGRUM, PMHNP 09/08/2022, 8:58 PM

## 2022-09-08 NOTE — ED Notes (Signed)
Patient reports he used the bathroom on himself and requested to take another shower. Patient provided with bathing supplies and is currently showering.

## 2022-09-08 NOTE — ED Provider Notes (Signed)
Emergency Medicine Observation Re-evaluation Note  [facility] Joseph Nunez is a 27 y.o. male, seen on rounds today.  Pt initially presented to the ED for complaints of Altered Mental Status Currently, the patient is resting comfortably.  Physical Exam  BP 109/84 (BP Location: Left Arm)   Pulse 95   Temp 98.9 F (37.2 C) (Oral)   Resp 18   Ht  (1.651 m)   Wt 61.2 kg   SpO2 100%   BMI 22.47 kg/m  Physical Exam General: NAD Lungs: no resp distress Psych: calm  ED Course / MDM  EKG:   I have reviewed the labs performed to date as well as medications administered while in observation.  Recent changes in the last 24 hours include tts eval. Recommended for inpt psych tx  Plan  Current plan is for placement.    Sloan Leiter, DO 09/08/22 [phone]

## 2022-09-08 NOTE — ED Notes (Signed)
Pt being reviewed for admission by [facility]

## 2022-09-08 NOTE — ED Notes (Signed)
Patient comes out of room stating he needs to take a third shower. Patient reports he has used bathroom on himself again. Writer explained to patient that he cannot take showers every hour and ensured patient understood there is bathroom in his room. Patient verbalized understanding and knows there is bathroom in his room. Patient verbalizes that he knows he should not be using bathroom on himself. Writer provided patient with body cleanser and cloths to clean self up in room. Patient grabbed supplies and laid on bed, refusing to use supplies. Advised patient the cleaner spray will be removed from room. Patient then sprayed cloth with cleaner and gave cleaner spray to staff. Patient stood in room and stared at staff while he rubbed cloth on his stomach. Once done, patient got back in his bed. It is questionable whether patient is actually soiling himself or just seeking attention .

## 2022-09-08 NOTE — ED Notes (Signed)
Patient began yelling and moaning very loudly. When asked if he was okay, patient states "what's up gang, you okay gang?"

## 2022-09-08 NOTE — ED Notes (Signed)
Patient yelling very loudly "fine miss fancy pants, the finest one I ever did see" followed by spitting.

## 2022-09-08 NOTE — ED Notes (Signed)
Patient was given shower supplies by sitter, patient showered independently.

## 2022-09-08 NOTE — Progress Notes (Signed)
Pt Denied by [facility]  Per assigned nurse Alfonzo Feller, RN assisted and completed verbal update with [facility] Intake. Per Alfonzo Feller, RN pt has been denied by [facility] due to their review and their findings that pt's primary needed is substance abuse and [facility] does not accept the primary dx of substance abuse. This CSW will continue to seek inpatient behavioral health placement.   Maryjean Ka, MSW, LCSWA 09/08/2022 1:00 AM

## 2022-09-08 NOTE — ED Notes (Signed)
Patient is calm and cooperative at this time. Patient does repeatedly come out of room and make requests for food. Patient was provided and finished breakfast tray. Patient has pleasant demeanor and is easily redirectable. Denies any SI/HI at this time.

## 2022-09-09 MED ORDER — AMPHETAMINE-DEXTROAMPHET ER 10 MG PO CP24
10.0000 mg | ORAL_CAPSULE | Freq: Once | ORAL | Status: AC
  Administered 2022-09-09: 10 mg via ORAL
  Filled 2022-09-09: qty 1

## 2022-09-09 NOTE — Progress Notes (Signed)
[facility] Psych ED Progress Note  09/09/2022 1:21 PM [facility] Ilda Mori  MRN:  [id number]   Principal Problem: Bipolar I disorder, current or most recent episode manic, severe Diagnosis:  Principal Problem:   Bipolar I disorder, current or most recent episode manic, severe Active Problems:   Cocaine use disorder   Agitation   ED Assessment Time Calculation: Start Time: 1000 Stop Time: 1015 Total Time in Minutes (Assessment Completion): 15    Subjective:  On evaluation today, the patient is sitting up in bed, eating breakfast in no acute distress. He is calm and cooperative during this assessment. His appearance is appropriate for environment. His eye contact is good.  Speech is clear and coherent, normal pace and normal volume. He reports his mood is good and he continues to ask when can he go home, he says his mother is not answering her phone and its making him worry. Affect is congruent with mood.  Thought process is coherent.  Thought content is within normal limits.  He denies auditory and visual hallucinations.  No indication that he is responding to internal stimuli during this assessment. No delusions elicited during this assessment.  He denies suicidal ideations. He denies homicidal ideations.  Patient continues to ask provider if we have reached his mother, states that he is ready to go home and he has been to go stay with his mom.  Patient able to discuss his admission, states that he apologizes for his behavior.  Per chart review patient did come out from today and yell out at RN, but the patient was redirectable.  Support, encouragement and reassurance provided about ongoing stressors and patient provided with opportunity for questions.    Patient is voluntary, compliant behavior on unit, compliant with medications. Attempted to call his mother x 2, phone number is not activated, unable to get a call through.   Past Psychiatric History: polysubstance use, substance induced  mood disorder vs BiPD, chronic HCV    [place] Scale:  Flowsheet Row ED from 09/06/2022 in [facility] Emergency Department at [facility] ED from 08/29/2022 in [facility] Emergency Department at [facility] ED from 06/26/2022 in [facility] Emergency Department at [facility]  C-SSRS RISK CATEGORY No Risk No Risk No Risk       Past Medical History:  Past Medical History:  Diagnosis Date   Cocaine abuse    Heroin abuse    IVDU (intravenous drug user)    MRSA (methicillin resistant staph aureus) culture positive    Substance abuse    Tobacco abuse     Past Surgical History:  Procedure Laterality Date   IRRIGATION AND DEBRIDEMENT ABSCESS N/A 03/08/2017   Procedure: IRRIGATION AND DEBRIDEMENT LEFT CHEST ABSCESS;  Surgeon: Karie Soda, MD;  Location: [facility] ORS;  Service: General;  Laterality: N/A;   TEE WITHOUT CARDIOVERSION N/A 03/08/2017   Procedure: TRANSESOPHAGEAL ECHOCARDIOGRAM (TEE);  Surgeon: Chilton Si, MD;  Location: [facility] ENDOSCOPY;  Service: Cardiovascular;  Laterality: N/A;   Family History:  Family History  Problem Relation Age of Onset   Drug abuse Mother    Heart disease Mother    Diabetes Mellitus II Sister     Social History:  Social History   Substance and Sexual Activity  Alcohol Use Yes     Social History   Substance and Sexual Activity  Drug Use Yes   Types: Amphetamines, Fentanyl, IV, Marijuana, MDMA (Ecstacy), Benzodiazepines, "Crack" cocaine, Heroin, Cocaine, Methamphetamines   Comment: heroin,  Social History   Socioeconomic History   Marital status: Single    Spouse name: Not on file   Number of children: Not on file   Years of education: Not on file   Highest education level: High school graduate  Occupational History   Not on file  Tobacco Use   Smoking status: Every Day    Packs/day: .25    Types: Cigarettes   Smokeless tobacco: Never  Vaping Use   Vaping Use: Never used  Substance and Sexual  Activity   Alcohol use: Yes   Drug use: Yes    Types: Amphetamines, Fentanyl, IV, Marijuana, MDMA (Ecstacy), Benzodiazepines, "Crack" cocaine, Heroin, Cocaine, Methamphetamines    Comment: heroin,    Sexual activity: Not Currently  Other Topics Concern   Not on file  Social History Narrative   ** Merged History Encounter **       Social Determinants of Health   Financial Resource Strain: Not on file  Food Insecurity: Food Insecurity Present (06/23/2022)   Hunger Vital Sign    Worried About Running Out of Food in the Last Year: Often true    Ran Out of Food in the Last Year: Often true  Transportation Needs: Unmet Transportation Needs (06/23/2022)   PRAPARE - Administrator, Civil Service (Medical): Yes    Lack of Transportation (Non-Medical): Yes  Physical Activity: Not on file  Stress: Not on file  Social Connections: Not on file    Sleep: Good  Appetite:  Good  Current Medications: Current Facility-Administered Medications  Medication Dose Route Frequency Provider Last Rate Last Admin   hydrOXYzine (ATARAX) tablet 25 mg  25 mg Oral TID PRN Motley-Mangrum, Briteny Fulghum A, PMHNP   25 mg at 09/09/22 1221   nicotine (NICODERM CQ - dosed in mg/24 hours) patch 21 mg  21 mg Transdermal Daily Vivien Rossetti C, MD   21 mg at 09/09/22 0936   propranolol (INDERAL) tablet 10 mg  10 mg Oral TID Motley-Mangrum, Harue Pribble A, PMHNP   10 mg at 09/09/22 0933   QUEtiapine (SEROQUEL) tablet 25 mg  25 mg Oral Daily Motley-Mangrum, Skylen Spiering A, PMHNP   25 mg at 09/09/22 0933   QUEtiapine (SEROQUEL) tablet 50 mg  50 mg Oral QHS Motley-Mangrum, Idy Rawling A, PMHNP   50 mg at 09/08/22 2112   Current Outpatient Medications  Medication Sig Dispense Refill   amphetamine-dextroamphetamine (ADDERALL XR) 10 MG 24 hr capsule Take 10 mg by mouth daily.     hydrOXYzine (VISTARIL) 25 MG capsule Take 25 mg by mouth 3 (three) times daily as needed for anxiety.     ibuprofen (ADVIL) 200 MG tablet Take 600 mg by  mouth as needed for headache or moderate pain.     propranolol (INDERAL) 10 MG tablet Take 10 mg by mouth 3 (three) times daily.     QUEtiapine (SEROQUEL) 25 MG tablet Take 1 tablet (25 mg total) by mouth daily. 30 tablet 0   QUEtiapine (SEROQUEL) 50 MG tablet Take 1 tablet (50 mg total) by mouth at bedtime. 30 tablet 0   thiamine (VITAMIN B-1) 100 MG tablet Take 1 tablet (100 mg total) by mouth daily. 30 tablet 0    Lab Results:  Results for orders placed or performed during the hospital encounter of 09/06/22 (from the past 48 hour(s))  Resp panel by RT-PCR (RSV, Flu A&B, Covid) Anterior Nasal Swab     Status: None   Collection Time: 09/07/22  7:51 PM   Specimen: Anterior  Nasal Swab  Result Value Ref Range   SARS Coronavirus 2 by RT PCR NEGATIVE NEGATIVE    Comment: (NOTE) SARS-CoV-2 target nucleic acids are NOT DETECTED.  The SARS-CoV-2 RNA is generally detectable in upper respiratory specimens during the acute phase of infection. The lowest concentration of SARS-CoV-2 viral copies this assay can detect is 138 copies/mL. A negative result does not preclude SARS-Cov-2 infection and should not be used as the sole basis for treatment or other patient management decisions. A negative result may occur with  improper specimen collection/handling, submission of specimen other than nasopharyngeal swab, presence of viral mutation(s) within the areas targeted by this assay, and inadequate number of viral copies(<138 copies/mL). A negative result must be combined with clinical observations, patient history, and epidemiological information. The expected result is Negative.  Fact Sheet for Patients:  BloggerCourse.com  Fact Sheet for Healthcare Providers:  SeriousBroker.it  This test is no t yet approved or cleared by the [place] FDA and  has been authorized for detection and/or diagnosis of SARS-CoV-2 by FDA under an Emergency Use  Authorization (EUA). This EUA will remain  in effect (meaning this test can be used) for the duration of the COVID-19 declaration under Section 564(b)(1) of the Act, 21 U.S.C.section 360bbb-3(b)(1), unless the authorization is terminated  or revoked sooner.       Influenza A by PCR NEGATIVE NEGATIVE   Influenza B by PCR NEGATIVE NEGATIVE    Comment: (NOTE) The Xpert Xpress SARS-CoV-2/FLU/RSV plus assay is intended as an aid in the diagnosis of influenza from Nasopharyngeal swab specimens and should not be used as a sole basis for treatment. Nasal washings and aspirates are unacceptable for Xpert Xpress SARS-CoV-2/FLU/RSV testing.  Fact Sheet for Patients: BloggerCourse.com  Fact Sheet for Healthcare Providers: SeriousBroker.it  This test is not yet approved or cleared by the [place] FDA and has been authorized for detection and/or diagnosis of SARS-CoV-2 by FDA under an Emergency Use Authorization (EUA). This EUA will remain in effect (meaning this test can be used) for the duration of the COVID-19 declaration under Section 564(b)(1) of the Act, 21 U.S.C. section 360bbb-3(b)(1), unless the authorization is terminated or revoked.     Resp Syncytial Virus by PCR NEGATIVE NEGATIVE    Comment: (NOTE) Fact Sheet for Patients: BloggerCourse.com  Fact Sheet for Healthcare Providers: SeriousBroker.it  This test is not yet approved or cleared by the [place] FDA and has been authorized for detection and/or diagnosis of SARS-CoV-2 by FDA under an Emergency Use Authorization (EUA). This EUA will remain in effect (meaning this test can be used) for the duration of the COVID-19 declaration under Section 564(b)(1) of the Act, 21 U.S.C. section 360bbb-3(b)(1), unless the authorization is terminated or revoked.  Performed at [facility], [address], [place], [place] [address]     Blood Alcohol level:  Lab Results  Component Value Date   [facility] <10 09/06/2022   ETH <10 08/29/2022    Physical Findings:  CIWA:    COWS:     Musculoskeletal: Strength & Muscle Tone: within normal limits Gait & Station: normal Patient leans: N/A  Psychiatric Specialty Exam:  Presentation  General Appearance:  Appropriate for Environment  Eye Contact: Good  Speech: Clear and Coherent  Speech Volume: Normal  Handedness: Right   Mood and Affect  Mood: Euthymic  Affect: Appropriate   Thought Process  Thought Processes: Coherent  Descriptions of Associations:Intact  Orientation:Full (Time, Place and Person)  Thought Content:WDL  History of Schizophrenia/Schizoaffective disorder:No data recorded Duration of Psychotic Symptoms:No data recorded Hallucinations:Hallucinations: None  Ideas of Reference:None  Suicidal Thoughts:Suicidal Thoughts: No  Homicidal Thoughts:Homicidal Thoughts: No   Sensorium  Memory: Immediate Fair; Recent Fair; Remote Good  Judgment: Fair  Insight: Fair   Art therapist  Concentration: Good  Attention Span: Good  Recall: Good  Fund of Knowledge: Good  Language: Good   Psychomotor Activity  Psychomotor Activity: Psychomotor Activity: Normal   Assets  Assets: Communication Skills; Housing; Social Support   Sleep  Sleep: Sleep: Good    Physical Exam: Physical Exam HENT:     Nose: Nose normal.  Eyes:     Pupils: Pupils are equal, round, and reactive to light.  Musculoskeletal:     Cervical back: Normal range of motion.  Neurological:     Mental Status: He is alert.  Psychiatric:        Mood and Affect: Mood normal.        Behavior: Behavior normal.        Thought Content: Thought content normal.        Judgment: Judgment normal.    Review of Systems  Constitutional: Negative.   HENT: Negative.    Respiratory: Negative.     Psychiatric/Behavioral: Negative.     Blood pressure 109/72, pulse 91, temperature 98.8 F (37.1 C), temperature source Oral, resp. rate 17, height 5\' 5"  (1.651 m), weight 61.2 kg, SpO2 100 %. Body mass index is 22.47 kg/m.    Medical Decision Making: Patient is psychiatrically cleared. Patient case review and discussed with Dr. Lucianne Muss, and patient does not meet inpatient criteria for inpatient psychiatric treatment. At time of discharge, patient denies SI, HI, AVH and can contract for safety. He demonstrated no overt evidence of psychosis or mania. Prior to discharge, he verbalized that they understood warning signs, triggers, and symptoms of worsening mental health and how to access emergency mental health care if they felt it was needed. Patient was instructed to call 911 or return to the emergency room if they experienced any concerning symptoms after discharge. Patient voiced understanding and agreed to the above.  Patient given resources to follow up with behavioral health urgent care for therapy and medication management. Patient denies access to weapons. Attempted to call his mother Aura Fey, several times, unable to get through, not able to speak with her. Patient compliant, going home to his mother.   Natoshia Souter MOTLEY-MANGRUM, PMHNP 09/09/2022, 1:21 PM

## 2022-09-09 NOTE — ED Provider Notes (Signed)
Assumed care of the patient at 1500.  Patient presented to the ED with acute psychosis.  Multiple positive on drug screen.  Seem to have recovered post metabolism.  He is cleared now by psychiatry.  Will have him follow-up as an outpatient.   Melene Plan, DO 09/09/22 1535

## 2022-09-09 NOTE — Discharge Instructions (Signed)
Follow up with your doctor in the office. Stop doing illegal drugs, they are going to get you killed or arrested.

## 2022-09-09 NOTE — ED Notes (Signed)
Patient has been asked multiple times to stay inside room and that he can not have another snack or shower. He has received showers, meals and all meds including PRNs. Security called due to patient tried to leave out of area

## 2022-09-09 NOTE — ED Notes (Signed)
Remains food focused makes frequent request of staff and has had several sandwiches and multiple sodas and graham crackers. Tahjir has been compliant with staff redirection and behavior has been self controlled.

## 2022-09-09 NOTE — ED Provider Notes (Signed)
  Physical Exam  BP 120/75 (BP Location: Left Arm)   Pulse 67   Temp 98.5 F (36.9 C) (Oral)   Resp 16   Ht  (1.651 m)   Wt 61.2 kg   SpO2 100%   BMI 22.47 kg/m   Physical Exam  Procedures  Procedures  ED Course / MDM   Clinical Course as of 09/09/22 0831  Thu Sep 06, 2022  1513 Found at 9PM altered on side of the road last night Now metabolizing. GCS improving  Reassess.  [CC]  Sat Sep 08, 2022  0818 COCAINE(!): POSITIVE [SG]    Clinical Course User Index [CC] Glyn Ade, MD [SG] Sloan Leiter, DO   Medical Decision Making Amount and/or Complexity of Data Reviewed Labs: ordered.  Risk Prescription drug management.   Still pending inpatient treatment.  Patient reported he does not want treatment.       Benjiman Core, MD 09/09/22 [id number]

## 2022-09-09 NOTE — ED Notes (Signed)
Patient DC by provider and walked out by security. He was given clothing, shoes personal items and bus pass.

## 2022-09-09 NOTE — ED Notes (Addendum)
Patient continues to leave personal room, attempts to sit at nurses station. He opened door to main ED and was verbally directed back.  Currently taking shower.

## 2022-09-17 ENCOUNTER — Ambulatory Visit (HOSPITAL_COMMUNITY)
Admission: EM | Admit: 2022-09-17 | Discharge: 2022-09-17 | Disposition: A | Discharge status: Home / Self Care | Payer: No Payment, Other | Attending: Nurse Practitioner | Admitting: Nurse Practitioner

## 2022-09-17 DIAGNOSIS — F319 Bipolar disorder, unspecified: Secondary | ICD-10-CM | POA: Insufficient documentation

## 2022-09-17 DIAGNOSIS — Z760 Encounter for issue of repeat prescription: Secondary | ICD-10-CM | POA: Insufficient documentation

## 2022-09-17 DIAGNOSIS — Z765 Malingerer [conscious simulation]: Secondary | ICD-10-CM

## 2022-09-17 DIAGNOSIS — F429 Obsessive-compulsive disorder, unspecified: Secondary | ICD-10-CM | POA: Insufficient documentation

## 2022-09-17 DIAGNOSIS — F909 Attention-deficit hyperactivity disorder, unspecified type: Secondary | ICD-10-CM | POA: Insufficient documentation

## 2022-09-17 DIAGNOSIS — Z5900 Homelessness unspecified: Secondary | ICD-10-CM

## 2022-09-17 NOTE — Progress Notes (Signed)
   09/17/22 2121  [facility] Triage Screening (Walk-ins at [facility] only)  How Did You Hear About [place]? Legal System  What Is the Reason for Your Visit/Call Today? Pt presents to [facility], accompanied by [employer] requesting medication refill. Pt is observed very fidgety, having difficulty focusing, and lethargic. Pt reports he was picked up by police and denies wanting to hurt himself or others. Pt reports history of opioid use, but denies using any alcohol/drugs within the last 24 hours. Pt denies SI, HI, AVH.  How Long Has This Been Causing You Problems? 1-6 months  Have You Recently Had Any Thoughts About Hurting Yourself? No  Are You Planning to Commit Suicide/Harm Yourself At This time? No  Have you Recently Had Thoughts About Hurting Someone Karolee Ohs? No  Are You Planning To Harm Someone At This Time? No  Are you currently experiencing any auditory, visual or other hallucinations? No  Have You Used Any Alcohol or Drugs in the Past 24 Hours? No (currently denies, but is currently observed fidgety, lethargic and unable to focus)  Do you have any current medical co-morbidities that require immediate attention? No  Clinician description of patient physical appearance/behavior: Pt is casually dressed, fidgety, unable to focus  What Do You Feel Would Help You the Most Today? Medication(s)  If access to [facility] was not available, would you have sought care in the Emergency Department? No  Determination of Need Routine (7 days)  Options For Referral Other: Comment;Chemical Dependency Intensive Outpatient Therapy (CDIOP)

## 2022-09-17 NOTE — Discharge Instructions (Addendum)

## 2022-09-17 NOTE — ED Provider Notes (Signed)
Behavioral Health Urgent Care Medical Screening Exam  Patient Name: Joseph Nunez MRN: [id number] Date of Evaluation: 09/17/22 Chief Complaint:   Diagnosis:  Final diagnoses:  Homelessness  Drug-seeking behavior    History of Present illness: [name] Joseph Nunez is a 27 y.o. male.  With psychiatric history of polysubstance dependence including opioid type drug with complication, continuous use (cocaine, heroin, benzos, tobacco, fentanyl), IV drug use, substance induced mood disorder, and bipolar 1 disorder, who was brought into [facility] by [employer] seeking refills for hydroxyzine, Klonopin, Xanax, and Adderall.  Patient was seen face-to-face by this provider and chart reviewed. Patient is well-known for his frequent presentations to the ED and urgent care for mental and medical complaints.  Patient has been seen 12 times in the last 6 months.    Today, patient reports "I just need a refill for my prescriptions. I take 2-3 antianxiety medicines, I take hydroxyzine, Klonopin, Adderall, and Xanax, and 1 last took my medicines yesterday... [name] at [facility] prescribed the medications, when I was at the hospital, the doctor said the hydroxyzine and Seroquel were not working out, so the prescribed Klonopin and Xanax and the only reason I came down here was to get my prescriptions for sure and I got insurance".  Patient reports " I need help with a lot of stuff, EBT,  I was living with my mom [name], but I'm currently looking for a job and I'm currently living in a tent".  Patient denies access to a gun or weapon.  Patient denies SI, denies HI, denies AVH or paranoia.  Patient reports he is diagnosed with OCD, anxiety, and ADHD.  Patient denies substance use.  Patient reports he is not currently established with outpatient psychiatric services. Support, encouragement, reassurance provided about ongoing stressors.  Patient is provided opportunity for  questions.  On evaluation, patient is alert, oriented x 3, and cooperative. Speech is slows. Pt appears Bizarre. Eye contact is fair. Mood is anxious, affect is congruent with mood. Thought process is goal directed and thought content is WDL. Pt denies SI/HI/AVH or paranoia. There is no objective indication that the patient is responding to internal stimuli. No delusions elicited during this assessment.      Flowsheet Row ED from 09/06/2022 in [facility] Emergency Department at [facility] ED from 08/29/2022 in [facility] Emergency Department at [facility] ED from 06/26/2022 in [facility] Emergency Department at [facility]  C-SSRS RISK CATEGORY No Risk No Risk No Risk       Psychiatric Specialty Exam  Presentation  General Appearance:Bizarre  Eye Contact:Fair  Speech:Slow  Speech Volume:Normal  Handedness:Right   Mood and Affect  Mood: Anxious  Affect: Congruent   Thought Process  Thought Processes: Goal Directed  Descriptions of Associations:Intact  Orientation:Full (Time, Place and Person)  Thought Content:WDL  Diagnosis of Schizophrenia or Schizoaffective disorder in past: No data recorded  Hallucinations:None  Ideas of Reference:Other (comment)  Suicidal Thoughts:No  Homicidal Thoughts:No   Sensorium  Memory: Immediate Fair  Judgment: Poor  Insight: Poor   Executive Functions  Concentration: Fair  Attention Span: Fair  Recall: Fair  Fund of Knowledge: Fair  Language: Fair   Psychomotor Activity  Psychomotor Activity: Normal   Assets  Assets: Manufacturing systems engineer; Desire for Improvement   Sleep  Sleep: Fair  Number of hours: No data recorded  Physical Exam: Physical Exam Constitutional:      General: He is not in acute  distress.    Appearance: He is not diaphoretic.  HENT:     Head: Normocephalic.     Right Ear: External ear normal.     Left Ear: External ear normal.     Nose: No  congestion.  Eyes:     General:        Right eye: No discharge.        Left eye: No discharge.  Pulmonary:     Effort: No respiratory distress.  Chest:     Chest wall: No tenderness.  Neurological:     Mental Status: He is alert and oriented to person, place, and time.  Psychiatric:        Attention and Perception: Attention and perception normal.        Mood and Affect: Mood is anxious.        Speech: Speech is slurred.        Behavior: Behavior is cooperative.        Thought Content: Thought content normal. Thought content is not paranoid or delusional. Thought content does not include homicidal or suicidal ideation. Thought content does not include homicidal or suicidal plan.        Cognition and Memory: Cognition and memory normal.    Review of Systems  Constitutional:  Negative for chills, diaphoresis and fever.  HENT:  Negative for congestion.   Eyes:  Negative for discharge.  Respiratory:  Negative for cough, shortness of breath and wheezing.   Cardiovascular:  Negative for chest pain and palpitations.  Gastrointestinal:  Negative for diarrhea, nausea and vomiting.  Neurological:  Negative for dizziness, seizures, loss of consciousness, weakness and headaches.  Psychiatric/Behavioral:  Negative for depression and suicidal ideas. The patient is nervous/anxious.    Musculoskeletal: Strength & Muscle Tone: within normal limits Gait & Station: normal Patient leans: N/A   [facility] MSE Discharge Disposition for Follow up and Recommendations: Based on my evaluation the patient does not appear to have an emergency medical condition and can be discharged with resources and follow up care in outpatient services for Medication Management  Recommend discharge and follow-up with outpatient psychiatric services for medication management.  Patient provided with resources for [facility] outpatient walk-in clinic and encouraged to follow-up.    Patient does not meet inpatient psychiatric  admission criteria or IVC criteria at this time.  There is no evidence of imminent risk of harm to self or others.  Please refrain from using alcohol or illicit substances, as they can affect your mood and can cause depression, anxiety or other concerning symptoms.  Alcohol can increase the chance that a person will make reckless decisions, like attempting suicide, and can increase the lethality of a drug overdose.    Discussed crisis plan, calling 911, going to the ED if condition changes or worsens.  Patient verbalizes understanding.  Patient discharged, and condition at discharge stable  Mancel Bale, NP 09/17/2022, 9:17 PM

## 2022-09-25 ENCOUNTER — Emergency Department (HOSPITAL_COMMUNITY)
Admission: EM | Admit: 2022-09-25 | Discharge: 2022-09-25 | Disposition: A | Discharge status: Home / Self Care | Payer: Self-pay | Attending: Emergency Medicine | Admitting: Emergency Medicine

## 2022-09-25 DIAGNOSIS — M79672 Pain in left foot: Secondary | ICD-10-CM | POA: Insufficient documentation

## 2022-09-25 DIAGNOSIS — M79671 Pain in right foot: Secondary | ICD-10-CM | POA: Insufficient documentation

## 2022-09-25 DIAGNOSIS — Z5900 Homelessness unspecified: Secondary | ICD-10-CM | POA: Insufficient documentation

## 2022-09-25 LAB — CBC WITH DIFFERENTIAL/PLATELET
Abs Immature Granulocytes: 0.02 10*3/uL (ref 0.00–0.07)
Basophils Absolute: 0 10*3/uL (ref 0.0–0.1)
Basophils Relative: 1 %
Eosinophils Absolute: 0.1 10*3/uL (ref 0.0–0.5)
Eosinophils Relative: 1 %
HCT: 42.5 % (ref 39.0–52.0)
Hemoglobin: 14 g/dL (ref 13.0–17.0)
Immature Granulocytes: 0 %
Lymphocytes Relative: 17 %
Lymphs Abs: 1.2 10*3/uL (ref 0.7–4.0)
MCH: 30.1 pg (ref 26.0–34.0)
MCHC: 32.9 g/dL (ref 30.0–36.0)
MCV: 91.4 fL (ref 80.0–100.0)
Monocytes Absolute: 0.3 10*3/uL (ref 0.1–1.0)
Monocytes Relative: 4 %
Neutro Abs: 5.1 10*3/uL (ref 1.7–7.7)
Neutrophils Relative %: 77 %
Platelets: 236 10*3/uL (ref 150–400)
RBC: 4.65 MIL/uL (ref 4.22–5.81)
RDW: 13.1 % (ref 11.5–15.5)
WBC: 6.6 10*3/uL (ref 4.0–10.5)
nRBC: 0 % (ref 0.0–0.2)

## 2022-09-25 LAB — COMPREHENSIVE METABOLIC PANEL
ALT: 77 U/L — ABNORMAL HIGH (ref 0–44)
AST: 24 U/L (ref 15–41)
Albumin: 4 g/dL (ref 3.5–5.0)
Alkaline Phosphatase: 63 U/L (ref 38–126)
Anion gap: 6 (ref 5–15)
BUN: 10 mg/dL (ref 6–20)
CO2: 30 mmol/L (ref 22–32)
Calcium: 9 mg/dL (ref 8.9–10.3)
Chloride: 101 mmol/L (ref 98–111)
Creatinine, Ser: 0.5 mg/dL — ABNORMAL LOW (ref 0.61–1.24)
GFR, Estimated: >60 mL/min (ref >60)
Glucose, Bld: 101 mg/dL — ABNORMAL HIGH (ref 70–99)
Potassium: 3.7 mmol/L (ref 3.5–5.1)
Sodium: 137 mmol/L (ref 135–145)
Total Bilirubin: 0.5 mg/dL (ref 0.3–1.2)
Total Protein: 6.9 g/dL (ref 6.5–8.1)

## 2022-09-25 LAB — RAPID URINE DRUG SCREEN, HOSP PERFORMED
Amphetamines: NOT DETECTED
Barbiturates: NOT DETECTED
Benzodiazepines: NOT DETECTED
Cocaine: POSITIVE — AB
Opiates: NOT DETECTED
Tetrahydrocannabinol: NOT DETECTED

## 2022-09-25 LAB — ETHANOL: Alcohol, Ethyl (B): <10 mg/dL (ref <10)

## 2022-09-25 NOTE — ED Notes (Signed)
Pt provided with a sandwich per RN.

## 2022-09-25 NOTE — ED Triage Notes (Signed)
[facility], found sleeping out front. Reports foot pain x1 week. Denies drug and etoh use. Glass pipe found next to him.  130/90 HR 52 98% RR 14 CBG 96

## 2022-09-25 NOTE — ED Provider Notes (Signed)
[place] EMERGENCY DEPARTMENT AT [facility] Provider Note   CSN: [id number] Arrival date & time: 09/25/22  4098     History {Add pertinent medical, surgical, social history, OB history to HPI:1} Chief Complaint  Patient presents with   Foot Pain    [facility] Joseph Nunez is a 27 y.o. male past medical history of polysubstance abuse, IVDU presenting to the emergency department by EMS for evaluation of foot pain.  Patient was found sleeping in front of [employer].  He denies SI HI.  He did not answer if he had any alcohol or used any drugs.   Foot Pain      Past Medical History:  Diagnosis Date   Cocaine abuse (HCC)    Heroin abuse (HCC)    IVDU (intravenous drug user)    MRSA (methicillin resistant staph aureus) culture positive    Substance abuse (HCC)    Tobacco abuse    Past Surgical History:  Procedure Laterality Date   IRRIGATION AND DEBRIDEMENT ABSCESS N/A 03/08/2017   Procedure: IRRIGATION AND DEBRIDEMENT LEFT CHEST ABSCESS;  Surgeon: Karie Soda, MD;  Location: [facility] ORS;  Service: General;  Laterality: N/A;   TEE WITHOUT CARDIOVERSION N/A 03/08/2017   Procedure: TRANSESOPHAGEAL ECHOCARDIOGRAM (TEE);  Surgeon: Chilton Si, MD;  Location: [facility] ENDOSCOPY;  Service: Cardiovascular;  Laterality: N/A;     Home Medications Prior to Admission medications   Medication Sig Start Date End Date Taking? Authorizing Provider  amphetamine-dextroamphetamine (ADDERALL XR) 10 MG 24 hr capsule Take 10 mg by mouth daily.    [provider]  hydrOXYzine (VISTARIL) 25 MG capsule Take 25 mg by mouth 3 (three) times daily as needed for anxiety.    [provider]  ibuprofen (ADVIL) 200 MG tablet Take 600 mg by mouth as needed for headache or moderate pain.    [provider]  propranolol (INDERAL) 10 MG tablet Take 10 mg by mouth 3 (three) times daily.    [provider]  QUEtiapine (SEROQUEL) 25 MG tablet Take 1 tablet (25 mg  total) by mouth daily. 02/23/22   Sheikh, Omair Latif, DO  QUEtiapine (SEROQUEL) 50 MG tablet Take 1 tablet (50 mg total) by mouth at bedtime. 02/22/22   Marguerita Merles Latif, DO  thiamine (VITAMIN B-1) 100 MG tablet Take 1 tablet (100 mg total) by mouth daily. 02/23/22   Marguerita Merles Latif, DO      Allergies    Fish allergy, Suboxone [buprenorphine hcl-naloxone hcl], and Penicillins    Review of Systems   Review of Systems Negative except as per HPI.  Physical Exam Updated Vital Signs BP (!) 127/108   Pulse 82   Temp 97.9 F (36.6 C) (Oral)   Resp 17   SpO2 99%  Physical Exam Vitals and nursing note reviewed.  HENT:     Head: Normocephalic and atraumatic.     Mouth/Throat:     Mouth: Mucous membranes are moist.  Eyes:     General: No scleral icterus. Cardiovascular:     Rate and Rhythm: Normal rate and regular rhythm.     Pulses: Normal pulses.     Heart sounds: Normal heart sounds.  Pulmonary:     Effort: Pulmonary effort is normal.     Breath sounds: Normal breath sounds.  Abdominal:     General: Abdomen is flat.     Palpations: Abdomen is soft.     Tenderness: There is no abdominal tenderness.  Musculoskeletal:  General: No deformity.  Skin:    General: Skin is warm.     Findings: No rash.  Neurological:     General: No focal deficit present.  Psychiatric:        Mood and Affect: Mood normal.     Comments: Patient appears somnolent.  He refused to answer any my questions except denying SI/HI.     ED Results / Procedures / Treatments   Labs (all labs ordered are listed, but only abnormal results are displayed) Labs Reviewed  RAPID URINE DRUG SCREEN, HOSP PERFORMED  ETHANOL  CBC WITH DIFFERENTIAL/PLATELET  COMPREHENSIVE METABOLIC PANEL    EKG None  Radiology No results found.  Procedures Procedures  {Document cardiac monitor, telemetry assessment procedure when appropriate:1}  Medications Ordered in ED Medications - No data to  display  ED Course/ Medical Decision Making/ A&P   {   Click here for ABCD2, HEART and other calculatorsREFRESH Note before signing :1}                          Medical Decision Making Amount and/or Complexity of Data Reviewed Labs: ordered.   ***  {Document critical care time when appropriate:1} {Document review of labs and clinical decision tools ie heart score, Chads2Vasc2 etc:1}  {Document your independent review of radiology images, and any outside records:1} {Document your discussion with family members, caretakers, and with consultants:1} {Document social determinants of health affecting pt's care:1} {Document your decision making why or why not admission, treatments were needed:1} Final Clinical Impression(s) / ED Diagnoses Final diagnoses:  None    Rx / DC Orders ED Discharge Orders     None

## 2022-09-25 NOTE — Discharge Instructions (Addendum)
Please take tylenol/ibuprofen for pain. I recommend close follow-up with PCP for reevaluation.  Please do not hesitate to return to emergency department if worrisome signs symptoms we discussed become apparent. Please use the resource guide for shelters.

## 2023-05-06 IMAGING — DX DG TIBIA/FIBULA 2V*L*
2 series · 2 of 2 positions shown · non-contrast
Comparison: None.

CLINICAL DATA: Wounds IV drug abuse

EXAM:
LEFT TIBIA AND FIBULA - 2 VIEW

[tibia ap]
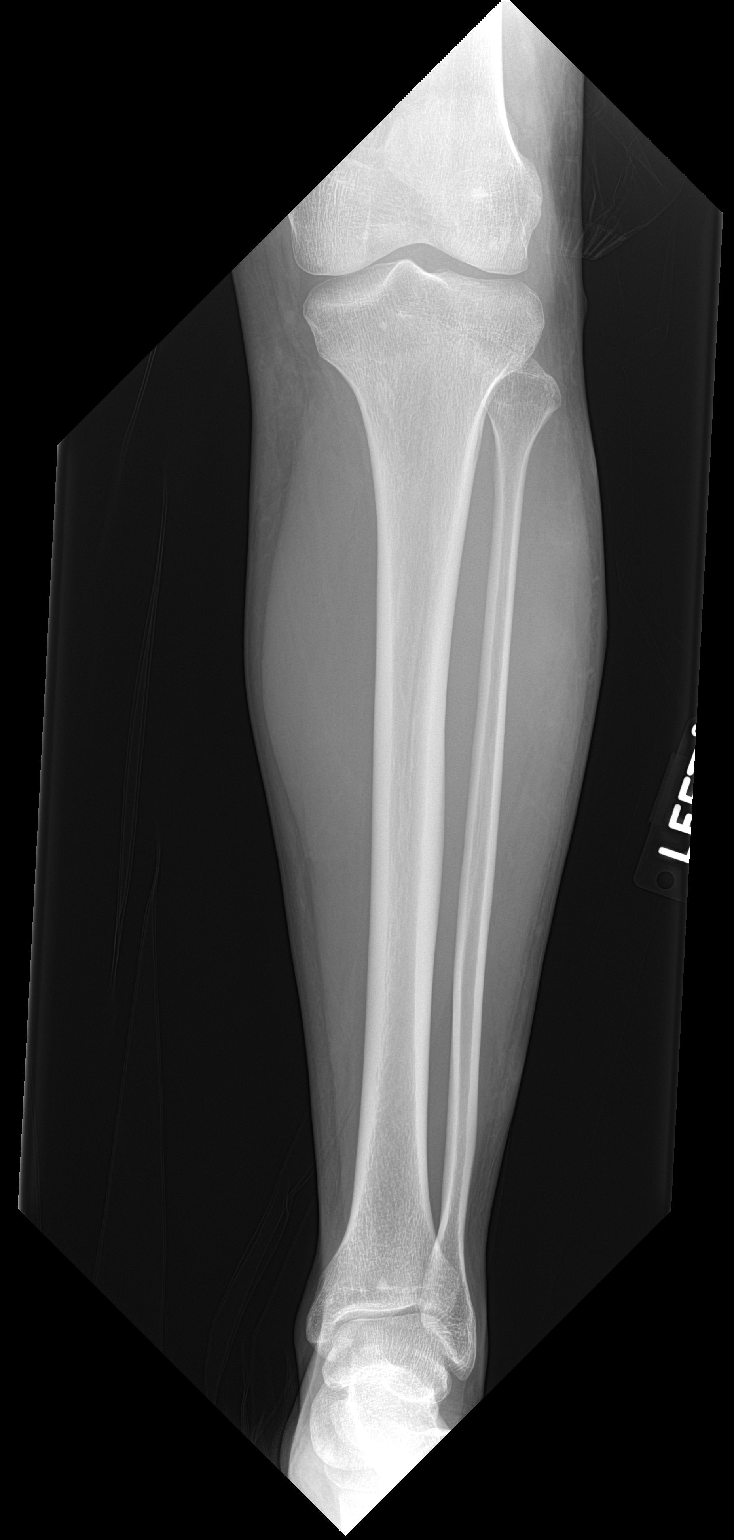

[tibia lat]
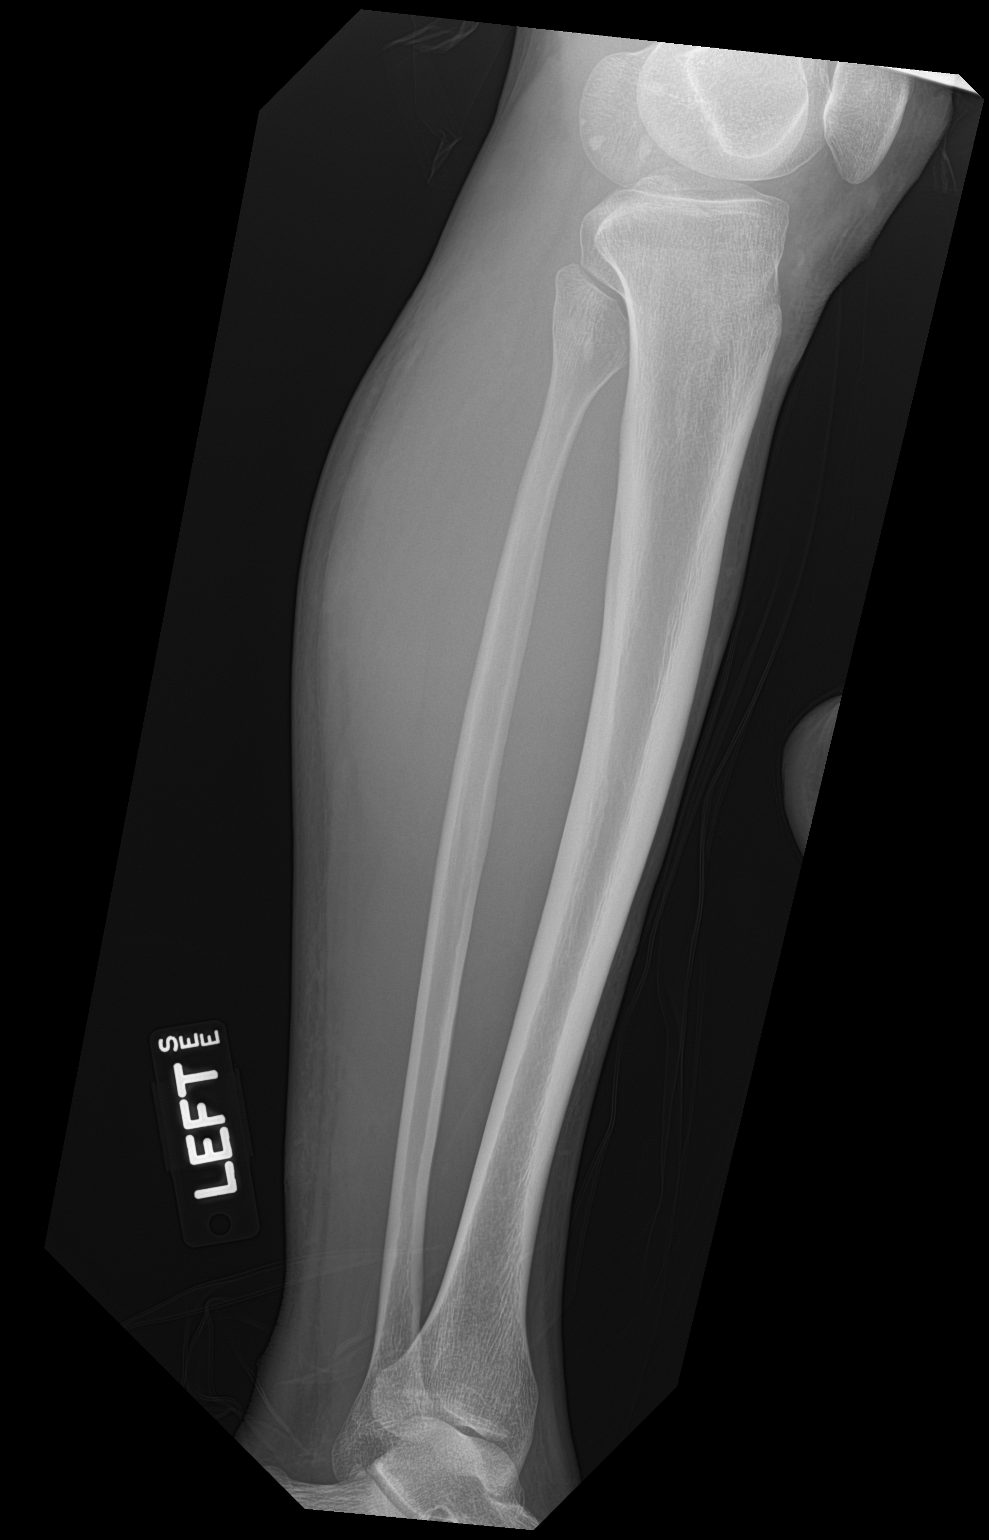

[2 of 2 positions shown; findings below may reference images not displayed]

FINDINGS: There is no evidence of fracture or other focal bone lesions. Soft
tissues are unremarkable.
IMPRESSION: Negative.

## 2023-07-24 ENCOUNTER — Encounter (HOSPITAL_COMMUNITY): Payer: Self-pay

## 2023-07-24 ENCOUNTER — Emergency Department (HOSPITAL_COMMUNITY)
Admission: EM | Admit: 2023-07-24 | Discharge: 2023-07-24 | Disposition: A | Discharge status: Home / Self Care | Payer: Self-pay | Attending: Emergency Medicine | Admitting: Emergency Medicine

## 2023-07-24 ENCOUNTER — Other Ambulatory Visit: Payer: Self-pay

## 2023-07-24 DIAGNOSIS — R7309 Other abnormal glucose: Secondary | ICD-10-CM | POA: Insufficient documentation

## 2023-07-24 DIAGNOSIS — R110 Nausea: Secondary | ICD-10-CM

## 2023-07-24 DIAGNOSIS — B349 Viral infection, unspecified: Secondary | ICD-10-CM | POA: Insufficient documentation

## 2023-07-24 LAB — CBC WITH DIFFERENTIAL/PLATELET
Abs Immature Granulocytes: 0.04 10*3/uL (ref 0.00–0.07)
Basophils Absolute: 0 10*3/uL (ref 0.0–0.1)
Basophils Relative: 0 %
Eosinophils Absolute: 0 10*3/uL (ref 0.0–0.5)
Eosinophils Relative: 0 %
HCT: 51.9 % (ref 39.0–52.0)
Hemoglobin: 17.5 g/dL — ABNORMAL HIGH (ref 13.0–17.0)
Immature Granulocytes: 0 %
Lymphocytes Relative: 7 %
Lymphs Abs: 0.8 10*3/uL (ref 0.7–4.0)
MCH: 30.5 pg (ref 26.0–34.0)
MCHC: 33.7 g/dL (ref 30.0–36.0)
MCV: 90.4 fL (ref 80.0–100.0)
Monocytes Absolute: 0.4 10*3/uL (ref 0.1–1.0)
Monocytes Relative: 3 %
Neutro Abs: 10.2 10*3/uL — ABNORMAL HIGH (ref 1.7–7.7)
Neutrophils Relative %: 90 %
Platelets: 351 10*3/uL (ref 150–400)
RBC: 5.74 MIL/uL (ref 4.22–5.81)
RDW: 12.8 % (ref 11.5–15.5)
WBC: 11.4 10*3/uL — ABNORMAL HIGH (ref 4.0–10.5)
nRBC: 0 % (ref 0.0–0.2)

## 2023-07-24 LAB — COMPREHENSIVE METABOLIC PANEL
ALT: 83 U/L — ABNORMAL HIGH (ref 0–44)
AST: 37 U/L (ref 15–41)
Albumin: 4.2 g/dL (ref 3.5–5.0)
Alkaline Phosphatase: 69 U/L (ref 38–126)
Anion gap: 13 (ref 5–15)
BUN: 21 mg/dL — ABNORMAL HIGH (ref 6–20)
CO2: 27 mmol/L (ref 22–32)
Calcium: 9.2 mg/dL (ref 8.9–10.3)
Chloride: 98 mmol/L (ref 98–111)
Creatinine, Ser: 0.71 mg/dL (ref 0.61–1.24)
GFR, Estimated: >60 mL/min (ref >60)
Glucose, Bld: 66 mg/dL — ABNORMAL LOW (ref 70–99)
Potassium: 3.5 mmol/L (ref 3.5–5.1)
Sodium: 138 mmol/L (ref 135–145)
Total Bilirubin: 0.9 mg/dL (ref 0.0–1.2)
Total Protein: 7.8 g/dL (ref 6.5–8.1)

## 2023-07-24 LAB — CBG MONITORING, ED
Glucose-Capillary: 151 mg/dL — ABNORMAL HIGH (ref 70–99)
Glucose-Capillary: 107 mg/dL — ABNORMAL HIGH (ref 70–99)

## 2023-07-24 LAB — RESP PANEL BY RT-PCR (RSV, FLU A&B, COVID)  RVPGX2
Influenza A by PCR: NEGATIVE
Influenza B by PCR: NEGATIVE
Resp Syncytial Virus by PCR: NEGATIVE
SARS Coronavirus 2 by RT PCR: NEGATIVE

## 2023-07-24 MED ORDER — ACETAMINOPHEN 500 MG PO TABS
1000.0000 mg | ORAL_TABLET | Freq: Once | ORAL | Status: AC
  Administered 2023-07-24: 1000 mg via ORAL
  Filled 2023-07-24: qty 2

## 2023-07-24 MED ORDER — ONDANSETRON 4 MG PO TBDP
4.0000 mg | ORAL_TABLET | Freq: Once | ORAL | Status: AC
  Administered 2023-07-24: 4 mg via ORAL
  Filled 2023-07-24: qty 1

## 2023-07-24 MED ORDER — IBUPROFEN 200 MG PO TABS
600.0000 mg | ORAL_TABLET | Freq: Once | ORAL | Status: AC
  Administered 2023-07-24: 600 mg via ORAL
  Filled 2023-07-24: qty 3

## 2023-07-24 MED ORDER — NALOXONE HCL 4 MG/0.1ML NA LIQD
NASAL | Status: AC
  Filled 2023-07-24: qty 4

## 2023-07-24 MED ORDER — ONDANSETRON HCL 4 MG PO TABS: 4.0000 mg | ORAL_TABLET | Freq: Four times a day (QID) | ORAL | 0 refills | Status: DC

## 2023-07-24 NOTE — ED Provider Triage Note (Signed)
 Emergency Medicine Provider Triage Evaluation Note  [facility] Ilda Mori , a 28 y.o. male  was evaluated in triage.  Pt complains of  nausea.  Review of Systems  Positive:  Negative:   Physical Exam  BP (!) 153/136   Pulse (!) 108   Temp 98.6 F (37 C) (Oral)   Resp 14   Ht 5\' 5"  (1.651 m)   Wt 61.2 kg   SpO2 100%   BMI 22.45 kg/m  Gen:   Awake, no distress   Resp:  Normal effort  MSK:   Moves extremities without difficulty  Other:    Medical Decision Making  Medically screening exam initiated at 2:36 PM.  Appropriate orders placed.  [facility] Ilda Mori was informed that the remainder of the evaluation will be completed by another provider, this initial triage assessment does not replace that evaluation, and the importance of remaining in the ED until their evaluation is complete.  Falls asleep quick but wakes to voice. Denies drug use today. Endorses nausea and "pain all over".     Dorthy Cooler, [place] 07/24/23 [phone]

## 2023-07-24 NOTE — ED Provider Notes (Signed)
 [place] EMERGENCY DEPARTMENT AT [facility] Provider Note   CSN: [id number] Arrival date & time: 07/24/23  1349     History  Chief Complaint  Patient presents with   Nausea    [facility] Joseph Nunez is a 28 y.o. male.  Patient is a 28 year old male with no significant past medical history presenting to the emergency department with nausea and bodyaches.  He states that his symptoms have been ongoing for the past few days.  He states that he has associated cough and congestion.  He reports feeling feverish at home but states he has not checked his temperature he denies any chest pain or shortness of breath.  The patient denies any current or recent drug use.  He states that his mom has been sick with similar symptoms.  The history is provided by the patient.       Home Medications Prior to Admission medications   Medication Sig Start Date End Date Taking? Authorizing Provider  ondansetron (ZOFRAN) 4 MG tablet Take 1 tablet (4 mg total) by mouth every 6 (six) hours. 07/24/23  Yes Elayne Snare K, DO  amphetamine-dextroamphetamine (ADDERALL XR) 10 MG 24 hr capsule Take 10 mg by mouth daily.    [provider]  hydrOXYzine (VISTARIL) 25 MG capsule Take 25 mg by mouth 3 (three) times daily as needed for anxiety.    [provider]  ibuprofen (ADVIL) 200 MG tablet Take 600 mg by mouth as needed for headache or moderate pain.    [provider]  propranolol (INDERAL) 10 MG tablet Take 10 mg by mouth 3 (three) times daily.    [provider]  QUEtiapine (SEROQUEL) 25 MG tablet Take 1 tablet (25 mg total) by mouth daily. 02/23/22   Sheikh, Omair Latif, DO  QUEtiapine (SEROQUEL) 50 MG tablet Take 1 tablet (50 mg total) by mouth at bedtime. 02/22/22   Marguerita Merles Latif, DO  thiamine (VITAMIN B-1) 100 MG tablet Take 1 tablet (100 mg total) by mouth daily. 02/23/22   Marguerita Merles Latif, DO      Allergies    Fish allergy, Suboxone  [buprenorphine hcl-naloxone hcl], and Penicillins    Review of Systems   Review of Systems  Physical Exam Updated Vital Signs BP 103/70 (BP Location: Left Arm)   Pulse 89   Temp (!) 97.5 F (36.4 C) (Axillary)   Resp 18   Ht 5\' 5"  (1.651 m)   Wt 61.2 kg   SpO2 97%   BMI 22.45 kg/m  Physical Exam Vitals and nursing note reviewed.  Constitutional:      General: He is not in acute distress.    Comments: Drowsy but arousable to verbal stimulation  HENT:     Head: Normocephalic and atraumatic.     Nose: Rhinorrhea present.     Mouth/Throat:     Mouth: Mucous membranes are moist.     Pharynx: Oropharynx is clear.  Eyes:     Extraocular Movements: Extraocular movements intact.     Comments: Mild bilateral conjunctival injection, pupils appropriate for light in the room  Cardiovascular:     Rate and Rhythm: Normal rate and regular rhythm.     Heart sounds: Normal heart sounds.  Pulmonary:     Effort: Pulmonary effort is normal.     Breath sounds: Normal breath sounds.  Abdominal:     General: Abdomen is flat.     Palpations: Abdomen is soft.     Tenderness: There is  no abdominal tenderness.  Musculoskeletal:        General: Normal range of motion.     Cervical back: Normal range of motion and neck supple.  Skin:    General: Skin is warm and dry.  Neurological:     General: No focal deficit present.     Mental Status: He is oriented to person, place, and time.  Psychiatric:        Mood and Affect: Mood normal.        Behavior: Behavior normal.     ED Results / Procedures / Treatments   Labs (all labs ordered are listed, but only abnormal results are displayed) Labs Reviewed  COMPREHENSIVE METABOLIC PANEL - Abnormal; Notable for the following components:      Result Value   Glucose, Bld 66 (*)    BUN 21 (*)    ALT 83 (*)    All other components within normal limits  CBC WITH DIFFERENTIAL/PLATELET - Abnormal; Notable for the following components:   WBC 11.4 (*)     Hemoglobin 17.5 (*)    Neutro Abs 10.2 (*)    All other components within normal limits  CBG MONITORING, ED - Abnormal; Notable for the following components:   Glucose-Capillary 151 (*)    All other components within normal limits  CBG MONITORING, ED - Abnormal; Notable for the following components:   Glucose-Capillary 107 (*)    All other components within normal limits  RESP PANEL BY RT-PCR (RSV, FLU A&B, COVID)  RVPGX2    EKG EKG Interpretation Date/Time:  Wednesday July 24 2023 15:15:32 EST Ventricular Rate:  63 PR Interval:  120 QRS Duration:  84 QT Interval:  352 QTC Calculation: 361 R Axis:   100  Text Interpretation: Sinus rhythm Borderline right axis deviation ST elev, probable normal early repol pattern No significant change since last tracing Confirmed by Elayne Snare ([id number]) on 07/24/2023 6:18:58 PM  Radiology No results found.  Procedures Procedures    Medications Ordered in ED Medications  acetaminophen (TYLENOL) tablet 1,000 mg (has no administration in time range)  ibuprofen (ADVIL) tablet 600 mg (has no administration in time range)  ondansetron (ZOFRAN-ODT) disintegrating tablet 4 mg (has no administration in time range)    ED Course/ Medical Decision Making/ A&P Clinical Course as of 07/24/23 1851  Wed Jul 24, 2023  1843 Glucose normal on recheck and patient tolerating PO. No significant risk factors so can follow up swab on patient portal. He is stable for discharge home with primary care follow up. [VK]    Clinical Course User Index [VK] Rexford Maus, DO                                 Medical Decision Making This patient presents to the ED with chief complaint(s) of nausea, body aches with no pertinent past medical history which further complicates the presenting complaint. The complaint involves an extensive differential diagnosis and also carries with it a high risk of complications and morbidity.    The differential diagnosis  includes viral syndrome, dehydration, electrolyte abnormality, pancreatitis, hepatitis, no point abdominal tenderness making intra-abdominal infection unlikely, no focal lung sounds and satting well on room air making a pneumonia unlikely, considering opioid withdrawal although patient denies any recent opioid use.,  Possible intoxication  Additional history obtained: Additional history obtained from N/A Records reviewed prior ED records  ED Course and Reassessment: On patient's arrival  he is hemodynamically stable in no acute distress.  Patient was initially evaluated by provider in triage and had labs performed that showed mild hypoglycemia and mild leukocytosis though may be hemoconcentration with elevated hemoglobin.  Patient will have repeat glucose and was given food and water to drink.  Patient will additionally be swabbed for COVID and flu.  He was given Tylenol, Motrin and Zofran for symptomatic management.    Independent labs interpretation:  The following labs were independently interpreted: Within normal range  Independent visualization of imaging: - N/A  Consultation: - Consulted or discussed management/test interpretation w/ external professional: N/A  Consideration for admission or further workup: .Patient has no emergent conditions requiring admission or further work-up at this time and is stable for discharge home with primary care follow-up  Social Determinants of health: polysubstance use; hx of homelessness, no PCP    Risk OTC drugs. Prescription drug management.          Final Clinical Impression(s) / ED Diagnoses Final diagnoses:  Nausea  Viral syndrome    Rx / DC Orders ED Discharge Orders          Ordered    ondansetron (ZOFRAN) 4 MG tablet  Every 6 hours        07/24/23 1850              Rexford Maus, DO 07/24/23 1851

## 2023-07-24 NOTE — Discharge Instructions (Signed)
 You were seen in the emergency department for your nausea, body aches, cough and congestion.  You were tested for COVID, flu and RSV and can follow up your results online on your patient portal.  You did not require treatment with any antiviral medications can continue symptomatic treatment with Tylenol and Motrin as needed for fevers and bodyaches and both can be taken up to every 6 hours.  I have given you prescription for Zofran that you can take as needed for nausea.  You can try Mucinex or raw honey as needed for your cough and you can use a humidifier or hot steam from the shower to help with your congestion as well as over-the-counter and nasal decongestant sprays.  You can follow-up with your primary doctor in the next few days to have your symptoms rechecked.  You should return to the emergency department for significantly worsening shortness of breath, severe chest pain, repetitive vomiting or if you have any other new or concerning symptoms.

## 2023-07-24 NOTE — ED Triage Notes (Signed)
 Pt states he is nauseous and has pain all over. Pt falling asleep during triage, responds to voice, but continues to fall back asleep when talking to pt. Denies drug use

## 2023-07-25 ENCOUNTER — Encounter (HOSPITAL_COMMUNITY): Payer: Self-pay

## 2023-07-25 ENCOUNTER — Emergency Department (HOSPITAL_COMMUNITY)
Admission: EM | Admit: 2023-07-25 | Discharge: 2023-07-25 | Disposition: A | Discharge status: Home / Self Care | Payer: Self-pay | Attending: Emergency Medicine | Admitting: Emergency Medicine

## 2023-07-25 ENCOUNTER — Other Ambulatory Visit: Payer: Self-pay

## 2023-07-25 ENCOUNTER — Encounter (HOSPITAL_COMMUNITY): Payer: Self-pay | Admitting: Emergency Medicine

## 2023-07-25 DIAGNOSIS — R1110 Vomiting, unspecified: Secondary | ICD-10-CM | POA: Insufficient documentation

## 2023-07-25 MED ORDER — ONDANSETRON 8 MG PO TBDP
8.0000 mg | ORAL_TABLET | Freq: Once | ORAL | Status: DC
  Filled 2023-07-25: qty 1

## 2023-07-25 NOTE — ED Triage Notes (Signed)
 Patient here earlier, checked back in under different name and birthday.  Patient reports emesis and chills x 2 days. Patient wrapped in hospital blanket.

## 2023-07-25 NOTE — ED Provider Notes (Signed)
  [name] EMERGENCY DEPARTMENT AT [facility] Provider Note   CSN: [id number] Arrival date & time: 07/25/23  0254     History  Chief Complaint  Patient presents with   Emesis   HPI Joseph Nunez is a 28 y.o. male presenting for emesis.  States has been going on for couple days.  Denies diarrhea and abdominal pain.  Registration and triage nurse was able to verify that he was evaluated earlier today for the same complaint.  He checked in under a different name and birthday.  He has been very reluctant to disclose any other information as to why he is here.  No witnessed vomiting during this encounter.  Patient is covered under a blanket refusing to communicate with staff.  Registration also mentioned that he used multiple different birthdays when asked to verify his information.   Emesis      Home Medications Prior to Admission medications   Not on File      Allergies    Patient has no known allergies.    Review of Systems   Review of Systems  Gastrointestinal:  Positive for vomiting.    Physical Exam Updated Vital Signs BP 101/74   Pulse (!) 59   Temp 98.4 F (36.9 C)   Resp 12   Wt 77.1 kg   SpO2 100%  Physical Exam Constitutional:      Appearance: Normal appearance.  HENT:     Head: Normocephalic.     Nose: Nose normal.  Eyes:     Conjunctiva/sclera: Conjunctivae normal.  Pulmonary:     Effort: Pulmonary effort is normal.  Abdominal:     General: There is no distension.     Palpations: Abdomen is soft.     Tenderness: There is no abdominal tenderness.  Neurological:     Mental Status: He is alert.  Psychiatric:        Mood and Affect: Mood normal.     ED Results / Procedures / Treatments   Labs (all labs ordered are listed, but only abnormal results are displayed) Labs Reviewed - No data to display  EKG None  Radiology No results found.  Procedures Procedures    Medications Ordered in ED Medications  ondansetron (ZOFRAN-ODT)  disintegrating tablet 8 mg (has no administration in time range)    ED Course/ Medical Decision Making/ A&P                                 Medical Decision Making Risk Prescription drug management.   28 year old well-appearing male presenting for emesis.  Exam was unremarkable.  Registration was able to determine that he did check-in under the name [name] earlier today.  Reviewed his chart and determined he had a reassuring workup earlier yesterday afternoon.  Gave Zofran.  There were no witnessed occurrences of emesis.  Suspect malingering.  Advised him to follow-up with PCP.  Discussed return precautions.  Discharged good condition.        Final Clinical Impression(s) / ED Diagnoses Final diagnoses:  Vomiting, unspecified vomiting type, unspecified whether nausea present    Rx / DC Orders ED Discharge Orders     None         Gareth Eagle, PA-C 07/25/23 0455    Anders Simmonds T, DO 08/01/23 [phone]

## 2023-07-25 NOTE — Discharge Instructions (Signed)
 Evaluation for emesis was overall reassuring.  Please follow-up with your PCP.

## 2023-10-11 ENCOUNTER — Other Ambulatory Visit: Payer: Self-pay

## 2023-10-11 ENCOUNTER — Emergency Department (HOSPITAL_COMMUNITY): Payer: Self-pay

## 2023-10-11 ENCOUNTER — Emergency Department (HOSPITAL_COMMUNITY)
Admission: EM | Admit: 2023-10-11 | Discharge: 2023-10-11 | Disposition: A | Discharge status: Home / Self Care | Payer: Self-pay | Attending: Emergency Medicine | Admitting: Emergency Medicine

## 2023-10-11 DIAGNOSIS — F1910 Other psychoactive substance abuse, uncomplicated: Secondary | ICD-10-CM

## 2023-10-11 DIAGNOSIS — T50901A Poisoning by unspecified drugs, medicaments and biological substances, accidental (unintentional), initial encounter: Secondary | ICD-10-CM

## 2023-10-11 DIAGNOSIS — R5383 Other fatigue: Secondary | ICD-10-CM | POA: Insufficient documentation

## 2023-10-11 DIAGNOSIS — T405X1A Poisoning by cocaine, accidental (unintentional), initial encounter: Secondary | ICD-10-CM | POA: Insufficient documentation

## 2023-10-11 DIAGNOSIS — R410 Disorientation, unspecified: Secondary | ICD-10-CM | POA: Insufficient documentation

## 2023-10-11 DIAGNOSIS — Y900 Blood alcohol level of less than 20 mg/100 ml: Secondary | ICD-10-CM | POA: Insufficient documentation

## 2023-10-11 LAB — CBC WITH DIFFERENTIAL/PLATELET
Abs Immature Granulocytes: 0.02 10*3/uL (ref 0.00–0.07)
Basophils Absolute: 0 10*3/uL (ref 0.0–0.1)
Basophils Relative: 0 %
Eosinophils Absolute: 0.2 10*3/uL (ref 0.0–0.5)
Eosinophils Relative: 4 %
HCT: 37.4 % — ABNORMAL LOW (ref 39.0–52.0)
Hemoglobin: 12.5 g/dL — ABNORMAL LOW (ref 13.0–17.0)
Immature Granulocytes: 0 %
Lymphocytes Relative: 25 %
Lymphs Abs: 1.7 10*3/uL (ref 0.7–4.0)
MCH: 30 pg (ref 26.0–34.0)
MCHC: 33.4 g/dL (ref 30.0–36.0)
MCV: 89.9 fL (ref 80.0–100.0)
Monocytes Absolute: 0.5 10*3/uL (ref 0.1–1.0)
Monocytes Relative: 8 %
Neutro Abs: 4.2 10*3/uL (ref 1.7–7.7)
Neutrophils Relative %: 63 %
Platelets: 269 10*3/uL (ref 150–400)
RBC: 4.16 MIL/uL — ABNORMAL LOW (ref 4.22–5.81)
RDW: 12.7 % (ref 11.5–15.5)
WBC: 6.7 10*3/uL (ref 4.0–10.5)
nRBC: 0 % (ref 0.0–0.2)

## 2023-10-11 LAB — CBG MONITORING, ED: Glucose-Capillary: 102 mg/dL — ABNORMAL HIGH (ref 70–99)

## 2023-10-11 LAB — URINALYSIS, ROUTINE W REFLEX MICROSCOPIC
Bilirubin Urine: NEGATIVE
Glucose, UA: NEGATIVE mg/dL
Hgb urine dipstick: NEGATIVE
Ketones, ur: NEGATIVE mg/dL
Leukocytes,Ua: NEGATIVE
Nitrite: NEGATIVE
Protein, ur: NEGATIVE mg/dL
Specific Gravity, Urine: 1.02 (ref 1.005–1.030)
pH: 6 (ref 5.0–8.0)

## 2023-10-11 LAB — RAPID URINE DRUG SCREEN, HOSP PERFORMED
Amphetamines: POSITIVE — AB
Barbiturates: NOT DETECTED
Benzodiazepines: NOT DETECTED
Cocaine: POSITIVE — AB
Opiates: NOT DETECTED
Tetrahydrocannabinol: NOT DETECTED

## 2023-10-11 LAB — COMPREHENSIVE METABOLIC PANEL WITH GFR
ALT: 57 U/L — ABNORMAL HIGH (ref 0–44)
AST: 27 U/L (ref 15–41)
Albumin: 3.7 g/dL (ref 3.5–5.0)
Alkaline Phosphatase: 83 U/L (ref 38–126)
Anion gap: 7 (ref 5–15)
BUN: 13 mg/dL (ref 6–20)
CO2: 29 mmol/L (ref 22–32)
Calcium: 8.6 mg/dL — ABNORMAL LOW (ref 8.9–10.3)
Chloride: 98 mmol/L (ref 98–111)
Creatinine, Ser: 0.59 mg/dL — ABNORMAL LOW (ref 0.61–1.24)
GFR, Estimated: >60 mL/min (ref >60)
Glucose, Bld: 113 mg/dL — ABNORMAL HIGH (ref 70–99)
Potassium: 3.6 mmol/L (ref 3.5–5.1)
Sodium: 134 mmol/L — ABNORMAL LOW (ref 135–145)
Total Bilirubin: 0.5 mg/dL (ref 0.0–1.2)
Total Protein: 6.7 g/dL (ref 6.5–8.1)

## 2023-10-11 LAB — ACETAMINOPHEN LEVEL: Acetaminophen (Tylenol), Serum: <10 ug/mL — ABNORMAL LOW (ref 10–30)

## 2023-10-11 LAB — CK: Total CK: 194 U/L (ref 49–397)

## 2023-10-11 LAB — SALICYLATE LEVEL: Salicylate Lvl: <7 mg/dL — ABNORMAL LOW (ref 7.0–30.0)

## 2023-10-11 LAB — ETHANOL: Alcohol, Ethyl (B): <15 mg/dL (ref <15)

## 2023-10-11 MED ORDER — SODIUM CHLORIDE 0.9 % IV BOLUS
1000.0000 mL | Freq: Once | INTRAVENOUS | Status: AC
  Administered 2023-10-11: 1000 mL via INTRAVENOUS

## 2023-10-11 MED ORDER — NALOXONE HCL 2 MG/2ML IJ SOSY
PREFILLED_SYRINGE | INTRAMUSCULAR | Status: AC
  Filled 2023-10-11: qty 2

## 2023-10-11 NOTE — ED Triage Notes (Signed)
 BIB EMS after being found unresponsive in a restaurant. Pt maintaining airway with short periods of apnea, pt only responds occasionally when called "Joseph Nunez", no ID or way of IDing pt upon arrival.

## 2023-10-11 NOTE — ED Provider Notes (Signed)
 Patient polysubstance overdose.  Observing for resolution of symptoms. Physical Exam  BP 94/76 (BP Location: Right Arm)   Pulse 65   Temp 97.7 F (36.5 C) (Oral)   Resp 18   SpO2 100%   Physical Exam  Procedures  Procedures  ED Course / MDM    Medical Decision Making Amount and/or Complexity of Data Reviewed Labs: ordered. Radiology: ordered.   Patient has eaten multiple meal bags and has several cups of coffee.  At this time stable for discharge.  Resources included for homelessness and medical care.       Wynetta Heckle, MD 10/11/23 [phone]

## 2023-10-11 NOTE — ED Notes (Signed)
Patient is resting comfortably. Given sandwich per request.

## 2023-10-11 NOTE — ED Notes (Signed)
 Patient is resting comfortably.

## 2023-10-11 NOTE — ED Notes (Signed)
 Pt took a shower and then refused vital signs before discharge. Paperwork was given. Pt left on his own.

## 2023-10-11 NOTE — ED Notes (Signed)
 Pt in shower.

## 2023-10-11 NOTE — ED Notes (Signed)
Pt able to ambulate unassisted

## 2023-10-11 NOTE — ED Provider Notes (Signed)
 [place] EMERGENCY DEPARTMENT AT [facility] Provider Note   CSN: [id number] Arrival date & time: 10/11/23  1044     History  Chief Complaint  Patient presents with   Drug Overdose    [facility] [place] is a 28 y.o. male.  Pt is a patient who looks to be in his late 20s/early 30s.  He was found unresponsive in a local restaurant.  EMS reports a crack pipe was found next to him.  He was breathing, so he was not giving any meds pta.  We don't know his name, but seemed to respond to [facility].  Pt is unable to give hx.       Home Medications Prior to Admission medications   Not on File      Allergies    Other    Review of Systems   Review of Systems  Unable to perform ROS: Mental status change  All other systems reviewed and are negative.   Physical Exam Updated Vital Signs BP 102/63   Pulse 70   Resp 17   SpO2 92%  Physical Exam Vitals and nursing note reviewed.  Constitutional:      Comments: Poor hygiene  HENT:     Head: Normocephalic and atraumatic.     Comments: Poor dental hygiene    Right Ear: External ear normal.     Left Ear: External ear normal.     Nose: Nose normal.     Mouth/Throat:     Mouth: Mucous membranes are dry.  Eyes:     Extraocular Movements: Extraocular movements intact.     Conjunctiva/sclera: Conjunctivae normal.     Pupils: Pupils are equal, round, and reactive to light.  Cardiovascular:     Rate and Rhythm: Normal rate and regular rhythm.     Pulses: Normal pulses.     Heart sounds: Normal heart sounds.  Pulmonary:     Effort: Pulmonary effort is normal.     Breath sounds: Normal breath sounds.  Abdominal:     General: Abdomen is flat. Bowel sounds are normal.     Palpations: Abdomen is soft.  Musculoskeletal:        General: Normal range of motion.  Skin:    General: Skin is warm.     Capillary Refill: Capillary refill takes less than 2 seconds.  Neurological:     Mental Status: He is easily aroused.  He is lethargic and disoriented.     Comments: Pt will wake up and move all extremities and was able to untie 1 shoe.  He said his name was Joseph Nunez, but unable to tell me anything else.  Psychiatric:     Comments: Unable to assess     ED Results / Procedures / Treatments   Labs (all labs ordered are listed, but only abnormal results are displayed) Labs Reviewed  COMPREHENSIVE METABOLIC PANEL WITH GFR - Abnormal; Notable for the following components:      Result Value   Sodium 134 (*)    Glucose, Bld 113 (*)    Creatinine, Ser 0.59 (*)    Calcium 8.6 (*)    ALT 57 (*)    All other components within normal limits  SALICYLATE LEVEL - Abnormal; Notable for the following components:   Salicylate Lvl <7.0 (*)    All other components within normal limits  ACETAMINOPHEN  LEVEL - Abnormal; Notable for the following components:   Acetaminophen  (Tylenol ), Serum <10 (*)    All other components within  normal limits  RAPID URINE DRUG SCREEN, HOSP PERFORMED - Abnormal; Notable for the following components:   Cocaine POSITIVE (*)    Amphetamines POSITIVE (*)    All other components within normal limits  CBC WITH DIFFERENTIAL/PLATELET - Abnormal; Notable for the following components:   RBC 4.16 (*)    Hemoglobin 12.5 (*)    HCT 37.4 (*)    All other components within normal limits  URINALYSIS, ROUTINE W REFLEX MICROSCOPIC - Abnormal; Notable for the following components:   APPearance HAZY (*)    All other components within normal limits  CBG MONITORING, ED - Abnormal; Notable for the following components:   Glucose-Capillary 102 (*)    All other components within normal limits  ETHANOL  CK    EKG EKG Interpretation Date/Time:  Friday Oct 11 2023 11:00:31 EDT Ventricular Rate:  72 PR Interval:  165 QRS Duration:  90 QT Interval:  407 QTC Calculation: 446 R Axis:   72  Text Interpretation: Sinus rhythm No old tracing to compare Confirmed by Sueellen Emery ([id number]) on 10/11/2023 11:29:37  AM  Radiology DG Chest Port 1 View Result Date: 10/11/2023 EXAM: 1 VIEW XRAY OF THE CHEST 10/11/2023 11:20:00 AM COMPARISON: None available. CLINICAL HISTORY: Overdose. Per chart: BIB EMS after being found unresponsive in a restaurant. Pt maintaining airway with short periods of apnea. FINDINGS: LUNGS AND PLEURA: Lung volumes are low. No focal pulmonary opacity. No pulmonary edema. No pleural effusion. No pneumothorax. HEART AND MEDIASTINUM: No acute abnormality of the cardiac and mediastinal silhouettes. BONES AND SOFT TISSUES: No acute osseous abnormality. IMPRESSION: 1. No acute process. 2. Low lung volumes. Electronically signed by: Audree Leas MD 10/11/2023 01:17 PM EDT RP Workstation: [id number]    Procedures Procedures    Medications Ordered in ED Medications  naloxone  (NARCAN ) 2 MG/2ML injection (has no administration in time range)  sodium chloride  0.9 % bolus 1,000 mL (0 mLs Intravenous Stopped 10/11/23 1312)  sodium chloride  0.9 % bolus 1,000 mL (1,000 mLs Intravenous New Bag/Given 10/11/23 1312)    ED Course/ Medical Decision Making/ A&P                                 Medical Decision Making Amount and/or Complexity of Data Reviewed Labs: ordered. Radiology: ordered.   This patient presents to the ED for concern of ams, this involves an extensive number of treatment options, and is a complaint that carries with it a high risk of complications and morbidity.  The differential diagnosis includes drug od, infection, electrolyte abn   Co morbidities that complicate the patient evaluation  unkn   Additional history obtained:  Additional history obtained from EMS report  Lab Tests:  I Ordered, and personally interpreted labs.  The pertinent results include:  cbc with hgb sl low at 12.5; ua neg, etoh neg, cmp nl, sal neg, acet neg; uds + cocaine and amphetamines   Imaging Studies ordered:  I ordered imaging studies including cxr  I independently visualized  and interpreted imaging which showed  No acute process.  2. Low lung volumes.   I agree with the radiologist interpretation   Cardiac Monitoring:  The patient was maintained on a cardiac monitor.  I personally viewed and interpreted the cardiac monitored which showed an underlying rhythm of: nsr   Medicines ordered and prescription drug management:  I ordered medication including ivfs  for sx  Reevaluation of the patient after these  medicines showed that the patient improved I have reviewed the patients home medicines and have made adjustments as needed   Critical Interventions:  ivfs   Problem List / ED Course:  Drug OD:  pt is more alert and is able to tell us  his correct name and address.  He is able to wake up and eat a sandwich.  However, he falls back to sleep and unable to stay awake very long.  We will watch him until he is more alert.     Reevaluation:  After the interventions noted above, I reevaluated the patient and found that they have :improved   Social Determinants of Health:  Lives at home   Dispostion:  After consideration of the diagnostic results and the patients response to treatment, I feel that the patent would benefit from discharge with outpatient f/u.          Final Clinical Impression(s) / ED Diagnoses Final diagnoses:  Polysubstance abuse (HCC)  Accidental drug overdose, initial encounter    Rx / DC Orders ED Discharge Orders     None         Sueellen Emery, MD 10/11/23 1537

## 2023-10-18 ENCOUNTER — Emergency Department (HOSPITAL_COMMUNITY)
Admission: EM | Admit: 2023-10-18 | Discharge: 2023-10-18 | Disposition: A | Discharge status: Home / Self Care | Payer: Self-pay | Attending: Emergency Medicine | Admitting: Emergency Medicine

## 2023-10-18 ENCOUNTER — Encounter (HOSPITAL_COMMUNITY): Payer: Self-pay

## 2023-10-18 ENCOUNTER — Other Ambulatory Visit: Payer: Self-pay

## 2023-10-18 DIAGNOSIS — F17210 Nicotine dependence, cigarettes, uncomplicated: Secondary | ICD-10-CM | POA: Insufficient documentation

## 2023-10-18 DIAGNOSIS — Z5900 Homelessness unspecified: Secondary | ICD-10-CM | POA: Insufficient documentation

## 2023-10-18 DIAGNOSIS — F1990 Other psychoactive substance use, unspecified, uncomplicated: Secondary | ICD-10-CM

## 2023-10-18 DIAGNOSIS — R600 Localized edema: Secondary | ICD-10-CM | POA: Insufficient documentation

## 2023-10-18 DIAGNOSIS — Z8616 Personal history of COVID-19: Secondary | ICD-10-CM | POA: Insufficient documentation

## 2023-10-18 LAB — CBG MONITORING, ED: Glucose-Capillary: 82 mg/dL (ref 70–99)

## 2023-10-18 MED ORDER — LORAZEPAM 0.5 MG PO TABS
0.5000 mg | ORAL_TABLET | Freq: Once | ORAL | Status: AC
  Administered 2023-10-18: 0.5 mg via ORAL
  Filled 2023-10-18: qty 1

## 2023-10-18 NOTE — ED Provider Notes (Signed)
 [place] EMERGENCY DEPARTMENT AT [facility] Provider Note  CSN: [id number] Arrival date & time: 10/18/23 1301  Chief Complaint(s) Ingestion (Pt admits to snorting Heroin)  HPI [facility] Joseph Nunez is a 28 y.o. male history of polysubstance abuse presenting to the emergency department with apparent agitation.  Apparently he was in a restaurant and seemed agitated.  He admits to using heroin today.  He otherwise reports that he is having anxiety about his living situation being homeless, concerned that he may lost his drugs but denies any other symptoms.  Denies any chest pain, fevers or chills, abdominal pain.  Has been walking around in the rain wearing shoes that are wet.   Past Medical History Past Medical History:  Diagnosis Date   Cocaine abuse (HCC)    Heroin abuse (HCC)    IVDU (intravenous drug user)    MRSA (methicillin resistant staph aureus) culture positive    Substance abuse (HCC)    Tobacco abuse    Patient Active Problem List   Diagnosis Date Noted   Chronic HCV infection 06/22/2022   Bipolar I disorder (HCC) 06/22/2022   Hypothermia, resolved 06/22/2022   Hyperkalemia 06/22/2022   Transaminitis 06/22/2022   COVID-19 virus infection 06/22/2022   Malnutrition of moderate degree 02/20/2022   Bipolar I disorder, current or most recent episode manic, severe (HCC) 11/12/2021   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   Polysubstance dependence including opioid type drug with complication, continuous use (HCC) 11/12/2021   Overdose 11/08/2021   Agitation    Acute encephalopathy    Acute respiratory failure (HCC)    Fentanyl  use disorder, 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 Authorizing Provider  amphetamine -dextroamphetamine  (ADDERALL XR) 10 MG 24 hr capsule Take 10 mg by mouth daily.    [provider]  hydrOXYzine  (VISTARIL ) 25 MG capsule Take 25 mg by mouth 3 (three) times daily as needed for anxiety.    [provider]  ibuprofen  (ADVIL ) 200 MG tablet Take 600 mg by mouth as needed for headache or moderate pain.    [provider]  ondansetron  (ZOFRAN ) 4 MG tablet Take 1 tablet (4 mg total) by mouth every 6 (six) hours. 07/24/23   Kingsley, Victoria K, DO  propranolol  (INDERAL ) 10 MG tablet Take 10 mg by mouth 3 (three) times daily.    [provider]  QUEtiapine  (SEROQUEL ) 25 MG tablet Take 1 tablet (25 mg total) by mouth daily. 02/23/22   Aura Leeds Latif, DO  QUEtiapine  (SEROQUEL ) 50 MG tablet Take 1 tablet (50 mg total) by mouth at bedtime. 02/22/22   Sheikh, Jonel Nephew Latif, DO  thiamine  (VITAMIN B-1) 100 MG tablet Take 1 tablet (100 mg total) by mouth daily. 02/23/22   Eveline Hipps, DO  Past Surgical History Past Surgical History:  Procedure Laterality Date   IRRIGATION AND DEBRIDEMENT ABSCESS N/A 03/08/2017   Procedure: IRRIGATION AND DEBRIDEMENT LEFT CHEST ABSCESS;  Surgeon: Candyce Champagne, MD;  Location: [facility] ORS;  Service: General;  Laterality: N/A;   TEE WITHOUT CARDIOVERSION N/A 03/08/2017   Procedure: TRANSESOPHAGEAL ECHOCARDIOGRAM (TEE);  Surgeon: Maudine Sos, MD;  Location[facility] ENDOSCOPY;  Service: Cardiovascular;  Laterality: N/A;   Family History Family History  Problem Relation Age of Onset   Drug abuse Mother    Heart disease  Mother    Diabetes Mellitus II Sister     Social History Social History   Tobacco Use   Smoking status: Every Day    Current packs/day: 0.25    Types: Cigarettes   Smokeless tobacco: Never  Vaping Use   Vaping status: Never Used  Substance Use Topics   Alcohol use: Yes   Drug use: Yes    Types: Amphetamines, Fentanyl , IV, Marijuana, MDMA (Ecstacy), Benzodiazepines, "Crack" cocaine, Heroin, Cocaine, Methamphetamines    Comment: heroin,    Allergies Fish allergy, Suboxone [buprenorphine hcl-naloxone  hcl], Other, and Penicillins  Review of Systems Review of Systems  All other systems reviewed and are negative.   Physical Exam Vital Signs  I have reviewed the triage vital signs BP 121/70   Pulse 87   Temp (!) 97.4 F (36.3 C) (Oral)   Resp (!) 24   SpO2 100%  Physical Exam Vitals and nursing note reviewed.  Constitutional:      General: He is not in acute distress.    Appearance: Normal appearance.  HENT:     Mouth/Throat:     Mouth: Mucous membranes are moist.  Eyes:     Conjunctiva/sclera: Conjunctivae normal.  Cardiovascular:     Rate and Rhythm: Normal rate and regular rhythm.  Pulmonary:     Effort: Pulmonary effort is normal. No respiratory distress.     Breath sounds: Normal breath sounds.  Abdominal:     General: Abdomen is flat.     Palpations: Abdomen is soft.     Tenderness: There is no abdominal tenderness.  Musculoskeletal:     Right lower leg: No edema.     Left lower leg: No edema.     Comments: Bilateral lower extremity foot sole edema   Skin:    General: Skin is warm and dry.     Capillary Refill: Capillary refill takes less than 2 seconds.  Neurological:     Mental Status: He is alert and oriented to person, place, and time. Mental status is at baseline.  Psychiatric:        Mood and Affect: Mood normal.        Behavior: Behavior normal.     Comments: Denies homicidal or suicidal ideation.  Denies hallucinations.  Mildly hyperactive      ED Results and Treatments Labs (all labs ordered are listed, but only abnormal results are displayed) Labs Reviewed  CBG MONITORING, ED  Radiology No results found.  Pertinent labs & imaging results that were available during my care of the patient were reviewed by me and considered in my medical decision making (see MDM for details).  Medications Ordered in ED Medications  LORazepam  (ATIVAN ) tablet 0.5 mg (0.5 mg Oral Given 10/18/23 1353)                                                                                                                                     Procedures Procedures  (including critical care time)  Medical Decision Making / ED Course   MDM:  28 year old presenting to the emergency department for concern for altered mental status.  On my interaction with patient, he is mildly agitated but otherwise denies any homicidal or suicidal ideation, denies any hallucinations.  Is linear train of thought.  Endorses ongoing drug use.  His vital signs are reassuring.  Blood glucose is normal.  He denies any medical complaints other than some foot pain, has what could be some early trench foot to feet from walking around in wet sneakers, patient was provided with clean and dry socks, meal. Provided resources for drug use, homelessness.  He denies any medical complaints and is refusing lab draw, so patient will be discharged.  Do not think he needs any psychiatric evaluation at this time. Will discharge patient to home. All questions answered. Patient comfortable with plan of discharge. Return precautions discussed with patient and specified on the after visit summary.       Additional history obtained: -Additional history obtained from ems -External records from outside source obtained and reviewed including: Chart review including previous  notes, labs, imaging, consultation notes including prior er visit for similar   Lab Tests: -I ordered, reviewed, and interpreted labs.   The pertinent results include:   Labs Reviewed  CBG MONITORING, ED    Notable for normal cbg   Medicines ordered and prescription drug management: Meds ordered this encounter  Medications   LORazepam  (ATIVAN ) tablet 0.5 mg    -I have reviewed the patients home medicines and have made adjustments as needed   Reevaluation: After the interventions noted above, I reevaluated the patient and found that their symptoms have improved  Co morbidities that complicate the patient evaluation  Past Medical History:  Diagnosis Date   Cocaine abuse (HCC)    Heroin abuse (HCC)    IVDU (intravenous drug user)    MRSA (methicillin resistant staph aureus) culture positive    Substance abuse (HCC)    Tobacco abuse       Dispostion: Disposition decision including need for hospitalization was considered, and patient discharged from emergency department.    Final Clinical Impression(s) / ED Diagnoses Final diagnoses:  Substance use disorder     This chart was dictated using voice recognition software.  Despite best efforts to proofread,  errors can occur which can change the documentation meaning.  Mordecai Applebaum, MD 10/18/23 [phone]

## 2023-10-18 NOTE — ED Notes (Signed)
Pt continues to refuse lab draw

## 2023-10-18 NOTE — Discharge Instructions (Addendum)
 We evaluated you for your substance use problems.  We feel that it is safe to go home.  We have given you some resources.  Please follow-up for further outpatient care.

## 2023-10-18 NOTE — ED Triage Notes (Signed)
 EMS reports bystander called Pt in [employer] restaurant acting erratic and altered. Pt arrives obviously altered. Pt admits to snorted Heroin today.   BP 122/70 HR 80 RR 16 Sp02 100 RA CBG 87

## 2023-10-18 NOTE — ED Notes (Signed)
Pt refuses lab draw.

## 2023-10-19 ENCOUNTER — Other Ambulatory Visit: Payer: Self-pay

## 2023-10-19 ENCOUNTER — Encounter (HOSPITAL_COMMUNITY): Payer: Self-pay

## 2023-10-19 ENCOUNTER — Emergency Department (HOSPITAL_COMMUNITY)
Admission: EM | Admit: 2023-10-19 | Discharge: 2023-10-19 | Disposition: A | Discharge status: Home / Self Care | Payer: Self-pay | Attending: Emergency Medicine | Admitting: Emergency Medicine

## 2023-10-19 DIAGNOSIS — Z79899 Other long term (current) drug therapy: Secondary | ICD-10-CM | POA: Insufficient documentation

## 2023-10-19 DIAGNOSIS — T40601A Poisoning by unspecified narcotics, accidental (unintentional), initial encounter: Secondary | ICD-10-CM | POA: Insufficient documentation

## 2023-10-19 LAB — CBC WITH DIFFERENTIAL/PLATELET
Abs Immature Granulocytes: 0.01 10*3/uL (ref 0.00–0.07)
Basophils Absolute: 0 10*3/uL (ref 0.0–0.1)
Basophils Relative: 1 %
Eosinophils Absolute: 0.2 10*3/uL (ref 0.0–0.5)
Eosinophils Relative: 4 %
HCT: 45.7 % (ref 39.0–52.0)
Hemoglobin: 15.3 g/dL (ref 13.0–17.0)
Immature Granulocytes: 0 %
Lymphocytes Relative: 32 %
Lymphs Abs: 1.4 10*3/uL (ref 0.7–4.0)
MCH: 30.3 pg (ref 26.0–34.0)
MCHC: 33.5 g/dL (ref 30.0–36.0)
MCV: 90.5 fL (ref 80.0–100.0)
Monocytes Absolute: 0.3 10*3/uL (ref 0.1–1.0)
Monocytes Relative: 7 %
Neutro Abs: 2.4 10*3/uL (ref 1.7–7.7)
Neutrophils Relative %: 56 %
Platelets: 278 10*3/uL (ref 150–400)
RBC: 5.05 MIL/uL (ref 4.22–5.81)
RDW: 13 % (ref 11.5–15.5)
WBC: 4.3 10*3/uL (ref 4.0–10.5)
nRBC: 0 % (ref 0.0–0.2)

## 2023-10-19 LAB — BASIC METABOLIC PANEL WITH GFR
Anion gap: 9 (ref 5–15)
BUN: 20 mg/dL (ref 6–20)
CO2: 31 mmol/L (ref 22–32)
Calcium: 9.3 mg/dL (ref 8.9–10.3)
Chloride: 97 mmol/L — ABNORMAL LOW (ref 98–111)
Creatinine, Ser: 0.69 mg/dL (ref 0.61–1.24)
GFR, Estimated: >60 mL/min (ref >60)
Glucose, Bld: 101 mg/dL — ABNORMAL HIGH (ref 70–99)
Potassium: 3.7 mmol/L (ref 3.5–5.1)
Sodium: 137 mmol/L (ref 135–145)

## 2023-10-19 MED ORDER — NALOXONE HCL 4 MG/0.1ML NA LIQD
1.0000 | Freq: Once | NASAL | Status: AC
  Administered 2023-10-19: 1 via NASAL
  Filled 2023-10-19: qty 4

## 2023-10-19 NOTE — Discharge Instructions (Addendum)
There is help if you need it.  Please do not use dirty needles, this could cause you a severe infection to your skin, heart or spinal cord.  This could kill you or leave you permanently disabled.  There was a recent study done at [facility] that showed that the risk of death for someone that had unintentionally overdosed on narcotics was as high as 15% in the next year.  This is much higher than most every other medical condition.  [place] Solution to the Opioid Problem ([facility]STOP) Fixed; mobile; peer-based Chase Holleman [phone] [email or web address] Fixed site exchange at [employer], Wednesdays (2-5pm) and Thursdays (4-8pm). [address] [place], [place] [address] Call or text to arrange mobile and peer exchange, Mondays (1-4pm) and Fridays (4-7pm). Serving [place] [email or web address]  Suboxone clinic: [facility] [address] [place], [address]  [facility] [address] [place], [address]  [facility] [address] [place] [address]  

## 2023-10-19 NOTE — ED Triage Notes (Signed)
 [facility] reports pt was found in parking lot by passerby unresponsive but breathing. Pt states he did Fentanyl . Pt was here recently for same. Pt had episodes of not breathing but would begin breathing on his own, no IV access with EMS.

## 2023-10-19 NOTE — ED Notes (Signed)
 This writer walked pt to bus stop. Pt was given a bus pass. Pt is safely at bus stop and I let him know the next bus would be arriving in 10 mins.

## 2023-10-19 NOTE — ED Provider Notes (Signed)
 [place] EMERGENCY DEPARTMENT AT [facility] Provider Note   CSN: [id number] Arrival date & time: 10/19/23  1121     History  Chief Complaint  Patient presents with   Drug Overdose    [facility] Samul Croft is a 28 y.o. male.  26 yoM with a chief complaints of an unintentional overdose.  Patient told EMS that he had taken fentanyl  earlier today.  No requirement for Narcan .  Will arouse to stimulation.  Level 5 caveat intoxication.   Drug Overdose       Home Medications Prior to Admission medications   Medication Sig Start Date End Date Taking? Authorizing Provider  amphetamine -dextroamphetamine  (ADDERALL XR) 10 MG 24 hr capsule Take 10 mg by mouth daily.    [provider]  hydrOXYzine  (VISTARIL ) 25 MG capsule Take 25 mg by mouth 3 (three) times daily as needed for anxiety.    [provider]  ibuprofen  (ADVIL ) 200 MG tablet Take 600 mg by mouth as needed for headache or moderate pain.    [provider]  ondansetron  (ZOFRAN ) 4 MG tablet Take 1 tablet (4 mg total) by mouth every 6 (six) hours. 07/24/23   Kingsley, Victoria K, DO  propranolol  (INDERAL ) 10 MG tablet Take 10 mg by mouth 3 (three) times daily.    [provider]  QUEtiapine  (SEROQUEL ) 25 MG tablet Take 1 tablet (25 mg total) by mouth daily. 02/23/22   Aura Leeds Latif, DO  QUEtiapine  (SEROQUEL ) 50 MG tablet Take 1 tablet (50 mg total) by mouth at bedtime. 02/22/22   Sheikh, Jonel Nephew Latif, DO  thiamine  (VITAMIN B-1) 100 MG tablet Take 1 tablet (100 mg total) by mouth daily. 02/23/22   Sheikh, Omair Latif, DO      Allergies    Fish allergy, Suboxone [buprenorphine hcl-naloxone  hcl], Other, and Penicillins    Review of Systems   Review of Systems  Physical Exam Updated Vital Signs BP 107/69   Pulse 65   Temp 97.6 F (36.4 C) (Oral)   Resp (!) 9   SpO2 100%  Physical Exam Vitals and nursing note reviewed.  Constitutional:      Appearance: He is  well-developed.     Comments: Patient will briefly mumble and awaken to loud noises or tactile stimuli.  HENT:     Head: Normocephalic and atraumatic.  Eyes:     Pupils: Pupils are equal, round, and reactive to light.  Neck:     Vascular: No JVD.  Cardiovascular:     Rate and Rhythm: Normal rate and regular rhythm.     Heart sounds: No murmur heard.    No friction rub. No gallop.  Pulmonary:     Effort: No respiratory distress.     Breath sounds: No wheezing.  Abdominal:     General: There is no distension.     Tenderness: There is no abdominal tenderness. There is no guarding or rebound.  Musculoskeletal:        General: Normal range of motion.     Cervical back: Normal range of motion and neck supple.  Skin:    Coloration: Skin is not pale.     Findings: No rash.  Neurological:     Comments: Mumbling speech.  Does consent that he took fentanyl   Psychiatric:        Behavior: Behavior normal.     ED Results / Procedures / Treatments   Labs (all labs ordered are listed, but only abnormal results are displayed) Labs  Reviewed  BASIC METABOLIC PANEL WITH GFR - Abnormal; Notable for the following components:      Result Value   Chloride 97 (*)    Glucose, Bld 101 (*)    All other components within normal limits  CBC WITH DIFFERENTIAL/PLATELET    EKG None  Radiology No results found.  Procedures Procedures    Medications Ordered in ED Medications  naloxone  (NARCAN ) nasal spray 4 mg/0.1 mL (has no administration in time range)    ED Course/ Medical Decision Making/ A&P                                 Medical Decision Making Amount and/or Complexity of Data Reviewed Labs: ordered.   28 yo M with a chief complaints of an unintentional overdose.  Patient has been recently seen in the emergency department for the same.  Observed in the ED.  Has had some modest improvement.  Will continue to assess for sobriety.  Patient is now awake and alert and able to  ambulate.  Tolerating by mouth without issue.  Will give a Narcan  kit for home.  PCP follow-up.  1:44 PM:  I have discussed the diagnosis/risks/treatment options with the patient.  Evaluation and diagnostic testing in the emergency department does not suggest an emergent condition requiring admission or immediate intervention beyond what has been performed at this time.  They will follow up with PCP. We also discussed returning to the ED immediately if new or worsening sx occur. We discussed the sx which are most concerning (e.g., sudden worsening pain, fever, inability to tolerate by mouth) that necessitate immediate return. Medications administered to the patient during their visit and any new prescriptions provided to the patient are listed below.  Medications given during this visit Medications  naloxone  (NARCAN ) nasal spray 4 mg/0.1 mL (has no administration in time range)     The patient appears reasonably screen and/or stabilized for discharge and I doubt any other medical condition or other [facility] requiring further screening, evaluation, or treatment in the ED at this time prior to discharge.          Final Clinical Impression(s) / ED Diagnoses Final diagnoses:  Opiate overdose, accidental or unintentional, initial encounter [facility])    Rx / DC Orders ED Discharge Orders     None         Albertus Hughs, DO 10/19/23 1344

## 2023-11-05 ENCOUNTER — Other Ambulatory Visit: Payer: Self-pay

## 2023-11-05 ENCOUNTER — Emergency Department (HOSPITAL_COMMUNITY)
Admission: EM | Admit: 2023-11-05 | Discharge: 2023-11-05 | Disposition: A | Discharge status: Home / Self Care | Payer: Self-pay | Attending: Emergency Medicine | Admitting: Emergency Medicine

## 2023-11-05 DIAGNOSIS — Z5900 Homelessness unspecified: Secondary | ICD-10-CM | POA: Insufficient documentation

## 2023-11-05 DIAGNOSIS — R059 Cough, unspecified: Secondary | ICD-10-CM | POA: Insufficient documentation

## 2023-11-05 DIAGNOSIS — R52 Pain, unspecified: Secondary | ICD-10-CM | POA: Insufficient documentation

## 2023-11-05 LAB — RESP PANEL BY RT-PCR (RSV, FLU A&B, COVID)  RVPGX2
Influenza A by PCR: NEGATIVE
Influenza B by PCR: NEGATIVE
Resp Syncytial Virus by PCR: NEGATIVE
SARS Coronavirus 2 by RT PCR: NEGATIVE

## 2023-11-05 MED ORDER — IBUPROFEN 200 MG PO TABS
400.0000 mg | ORAL_TABLET | Freq: Once | ORAL | Status: AC
  Administered 2023-11-05: 400 mg via ORAL
  Filled 2023-11-05: qty 2

## 2023-11-05 NOTE — ED Notes (Signed)
 Pt requested to take a shower. I gave him a basin with shampoo/body wash, lotion, roll on deodorant, 2 washcloths and a towel. Also provided him with top and bottom blue paper scrubs.

## 2023-11-05 NOTE — Discharge Instructions (Signed)
 You can continue 400 mg of ibuprofen  every 4-6 hours as needed for body aches.  Return to the emergency department if your symptoms worsen.  I have provided you with the contact information for the [facility] behavioral health urgent care, you can follow-up with them for additional assistance and resources.
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
## 2023-11-13 ENCOUNTER — Other Ambulatory Visit: Payer: Self-pay

## 2023-11-13 ENCOUNTER — Encounter (HOSPITAL_COMMUNITY): Payer: Self-pay

## 2023-11-13 ENCOUNTER — Emergency Department (HOSPITAL_COMMUNITY)
Admission: EM | Admit: 2023-11-13 | Discharge: 2023-11-13 | Disposition: A | Discharge status: Home / Self Care | Payer: Self-pay

## 2023-11-13 DIAGNOSIS — R4182 Altered mental status, unspecified: Secondary | ICD-10-CM | POA: Insufficient documentation

## 2023-11-13 DIAGNOSIS — R000 Tachycardia, unspecified: Secondary | ICD-10-CM | POA: Insufficient documentation

## 2023-11-13 DIAGNOSIS — F1910 Other psychoactive substance abuse, uncomplicated: Secondary | ICD-10-CM | POA: Insufficient documentation

## 2023-11-13 NOTE — ED Notes (Signed)
 Pt. Redirected to room multiple times after being found walking up and down the hallway.
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
## 2023-11-13 NOTE — ED Notes (Signed)
 Pt wa;lking around ED refusing to go to his room, asking to cal his mother but wont stay still for phone call. Pt currently in bathroom.
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
## 2023-11-13 NOTE — ED Notes (Signed)
 Pt walking around and restless. When trying to discharge patient he keeps asking for his moms number. Pt provided with her number. Pt provided with shoes to leave. Security at bedside. Pt is alert and oriented.

## 2023-11-13 NOTE — Discharge Instructions (Addendum)
 Please avoid drug use.

## 2023-12-21 ENCOUNTER — Emergency Department (HOSPITAL_COMMUNITY)
Admission: EM | Admit: 2023-12-21 | Discharge: 2023-12-21 | Disposition: A | Discharge status: Home / Self Care | Payer: Self-pay | Attending: Emergency Medicine | Admitting: Emergency Medicine

## 2023-12-21 ENCOUNTER — Encounter (HOSPITAL_COMMUNITY): Payer: Self-pay | Admitting: Emergency Medicine

## 2023-12-21 ENCOUNTER — Emergency Department (HOSPITAL_COMMUNITY): Payer: Self-pay

## 2023-12-21 ENCOUNTER — Other Ambulatory Visit: Payer: Self-pay

## 2023-12-21 DIAGNOSIS — R519 Headache, unspecified: Secondary | ICD-10-CM | POA: Insufficient documentation

## 2023-12-21 DIAGNOSIS — F1490 Cocaine use, unspecified, uncomplicated: Secondary | ICD-10-CM | POA: Insufficient documentation

## 2023-12-21 LAB — CBC WITH DIFFERENTIAL/PLATELET
Abs Immature Granulocytes: 0.02 K/uL (ref 0.00–0.07)
Basophils Absolute: 0 K/uL (ref 0.0–0.1)
Basophils Relative: 0 %
Eosinophils Absolute: 0.1 K/uL (ref 0.0–0.5)
Eosinophils Relative: 1 %
HCT: 43.8 % (ref 39.0–52.0)
Hemoglobin: 14.4 g/dL (ref 13.0–17.0)
Immature Granulocytes: 0 %
Lymphocytes Relative: 12 %
Lymphs Abs: 1.1 K/uL (ref 0.7–4.0)
MCH: 29.8 pg (ref 26.0–34.0)
MCHC: 32.9 g/dL (ref 30.0–36.0)
MCV: 90.7 fL (ref 80.0–100.0)
Monocytes Absolute: 0.5 K/uL (ref 0.1–1.0)
Monocytes Relative: 5 %
Neutro Abs: 7.8 K/uL — ABNORMAL HIGH (ref 1.7–7.7)
Neutrophils Relative %: 82 %
Platelets: 272 K/uL (ref 150–400)
RBC: 4.83 MIL/uL (ref 4.22–5.81)
RDW: 13.6 % (ref 11.5–15.5)
WBC: 9.5 K/uL (ref 4.0–10.5)
nRBC: 0 % (ref 0.0–0.2)

## 2023-12-21 LAB — COMPREHENSIVE METABOLIC PANEL WITH GFR
ALT: 35 U/L (ref 0–44)
AST: 22 U/L (ref 15–41)
Albumin: 3.4 g/dL — ABNORMAL LOW (ref 3.5–5.0)
Alkaline Phosphatase: 68 U/L (ref 38–126)
Anion gap: 13 (ref 5–15)
BUN: 9 mg/dL (ref 6–20)
CO2: 23 mmol/L (ref 22–32)
Calcium: 8.7 mg/dL — ABNORMAL LOW (ref 8.9–10.3)
Chloride: 102 mmol/L (ref 98–111)
Creatinine, Ser: 0.57 mg/dL — ABNORMAL LOW (ref 0.61–1.24)
GFR, Estimated: >60 mL/min (ref >60)
Glucose, Bld: 113 mg/dL — ABNORMAL HIGH (ref 70–99)
Potassium: 4 mmol/L (ref 3.5–5.1)
Sodium: 138 mmol/L (ref 135–145)
Total Bilirubin: 0.5 mg/dL (ref 0.0–1.2)
Total Protein: 7.1 g/dL (ref 6.5–8.1)

## 2023-12-21 LAB — ETHANOL: Alcohol, Ethyl (B): <15 mg/dL (ref <15)

## 2023-12-21 LAB — RAPID URINE DRUG SCREEN, HOSP PERFORMED
Amphetamines: NOT DETECTED
Barbiturates: NOT DETECTED
Benzodiazepines: NOT DETECTED
Cocaine: POSITIVE — AB
Opiates: NOT DETECTED
Tetrahydrocannabinol: NOT DETECTED

## 2023-12-21 LAB — CK: Total CK: 39 U/L — ABNORMAL LOW (ref 49–397)

## 2023-12-21 LAB — ACETAMINOPHEN LEVEL: Acetaminophen (Tylenol), Serum: <10 ug/mL — ABNORMAL LOW (ref 10–30)

## 2023-12-21 MED ORDER — SODIUM CHLORIDE 0.9 % IV BOLUS
1000.0000 mL | Freq: Once | INTRAVENOUS | Status: AC
  Administered 2023-12-21: 1000 mL via INTRAVENOUS
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
## 2023-12-21 NOTE — ED Notes (Signed)
 Pt was given two sandwiches and apple juice
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
## 2023-12-21 NOTE — Discharge Instructions (Addendum)
 Your workup was reassuring today.  Take your medicines.

## 2023-12-24 ENCOUNTER — Emergency Department (HOSPITAL_COMMUNITY)
Admission: EM | Admit: 2023-12-24 | Discharge: 2023-12-24 | Disposition: A | Discharge status: Home / Self Care | Payer: Self-pay | Attending: Emergency Medicine | Admitting: Emergency Medicine

## 2023-12-24 ENCOUNTER — Encounter (HOSPITAL_COMMUNITY): Payer: Self-pay | Admitting: Emergency Medicine

## 2023-12-24 ENCOUNTER — Other Ambulatory Visit: Payer: Self-pay

## 2023-12-24 DIAGNOSIS — Z043 Encounter for examination and observation following other accident: Secondary | ICD-10-CM | POA: Insufficient documentation

## 2023-12-24 DIAGNOSIS — Z765 Malingerer [conscious simulation]: Secondary | ICD-10-CM | POA: Insufficient documentation

## 2023-12-24 MED ORDER — ACETAMINOPHEN 325 MG PO TABS
650.0000 mg | ORAL_TABLET | Freq: Once | ORAL | Status: AC
  Administered 2023-12-24: 650 mg via ORAL
  Filled 2023-12-24: qty 2
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
## 2023-12-24 NOTE — Discharge Instructions (Signed)
 You have been discharged from the ED.
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
## 2023-12-29 ENCOUNTER — Encounter (HOSPITAL_COMMUNITY): Payer: Self-pay

## 2023-12-29 ENCOUNTER — Emergency Department (HOSPITAL_COMMUNITY)
Admission: EM | Admit: 2023-12-29 | Discharge: 2023-12-29 | Disposition: A | Discharge status: Home / Self Care | Payer: Self-pay | Attending: Emergency Medicine | Admitting: Emergency Medicine

## 2023-12-29 ENCOUNTER — Other Ambulatory Visit: Payer: Self-pay

## 2023-12-29 DIAGNOSIS — T50904A Poisoning by unspecified drugs, medicaments and biological substances, undetermined, initial encounter: Secondary | ICD-10-CM

## 2023-12-29 DIAGNOSIS — Z5329 Procedure and treatment not carried out because of patient's decision for other reasons: Secondary | ICD-10-CM | POA: Insufficient documentation

## 2023-12-29 DIAGNOSIS — Z720 Tobacco use: Secondary | ICD-10-CM | POA: Insufficient documentation

## 2023-12-29 DIAGNOSIS — T50901A Poisoning by unspecified drugs, medicaments and biological substances, accidental (unintentional), initial encounter: Secondary | ICD-10-CM | POA: Insufficient documentation

## 2023-12-29 DIAGNOSIS — R000 Tachycardia, unspecified: Secondary | ICD-10-CM | POA: Insufficient documentation

## 2023-12-29 DIAGNOSIS — R7401 Elevation of levels of liver transaminase levels: Secondary | ICD-10-CM | POA: Insufficient documentation

## 2023-12-29 LAB — COMPREHENSIVE METABOLIC PANEL WITH GFR
ALT: 56 U/L — ABNORMAL HIGH (ref 0–44)
AST: 24 U/L (ref 15–41)
Albumin: 3.1 g/dL — ABNORMAL LOW (ref 3.5–5.0)
Alkaline Phosphatase: 62 U/L (ref 38–126)
Anion gap: 11 (ref 5–15)
BUN: 11 mg/dL (ref 6–20)
CO2: 23 mmol/L (ref 22–32)
Calcium: 8.4 mg/dL — ABNORMAL LOW (ref 8.9–10.3)
Chloride: 102 mmol/L (ref 98–111)
Creatinine, Ser: 0.6 mg/dL — ABNORMAL LOW (ref 0.61–1.24)
GFR, Estimated: >60 mL/min (ref >60)
Glucose, Bld: 97 mg/dL (ref 70–99)
Potassium: 3.9 mmol/L (ref 3.5–5.1)
Sodium: 136 mmol/L (ref 135–145)
Total Bilirubin: 0.3 mg/dL (ref 0.0–1.2)
Total Protein: 6.4 g/dL — ABNORMAL LOW (ref 6.5–8.1)

## 2023-12-29 LAB — CBC
HCT: 35 % — ABNORMAL LOW (ref 39.0–52.0)
Hemoglobin: 11.1 g/dL — ABNORMAL LOW (ref 13.0–17.0)
MCH: 29.8 pg (ref 26.0–34.0)
MCHC: 31.7 g/dL (ref 30.0–36.0)
MCV: 93.8 fL (ref 80.0–100.0)
Platelets: 271 K/uL (ref 150–400)
RBC: 3.73 MIL/uL — ABNORMAL LOW (ref 4.22–5.81)
RDW: 13.9 % (ref 11.5–15.5)
WBC: 6.9 K/uL (ref 4.0–10.5)
nRBC: 0 % (ref 0.0–0.2)

## 2023-12-29 MED ORDER — SODIUM CHLORIDE 0.9 % IV BOLUS
1000.0000 mL | Freq: Once | INTRAVENOUS | Status: AC
  Administered 2023-12-29: 1000 mL via INTRAVENOUS

## 2023-12-29 NOTE — ED Triage Notes (Signed)
 Pt BIBA from gas station, c/o substance abuse. Unaware of substance. Lethargic but answers questions. No narcan  was given. VSS.   CBG 164
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
## 2023-12-29 NOTE — ED Notes (Signed)
 Pt left AMA after being told he could not just wander the halls. This tech told pt he would have to get his IV removed before leaving. Pt proceeded to pull IV out himself, band aid was offered, pt walked out. PA and Paramedic notified

## 2023-12-29 NOTE — ED Notes (Signed)
 After coming back from taking another patient upstairs, Clinical research associate was informed that Pt had asked for a bus pass at the [employer]. Pt had been informed that when he was discharged, he could have a bus pass. Pt then stated he wanted to leave now. Pt had been informed that he needed to have his IV taken out before he left. It was reported to writer that Pt then pulled his own IV out and walked out the front door.

## 2024-01-10 ENCOUNTER — Emergency Department (HOSPITAL_COMMUNITY)
Admission: EM | Admit: 2024-01-10 | Discharge: 2024-01-11 | Disposition: A | Discharge status: Home / Self Care | Payer: Self-pay

## 2024-01-10 DIAGNOSIS — F1910 Other psychoactive substance abuse, uncomplicated: Secondary | ICD-10-CM | POA: Insufficient documentation

## 2024-01-10 DIAGNOSIS — Y909 Presence of alcohol in blood, level not specified: Secondary | ICD-10-CM | POA: Insufficient documentation

## 2024-01-10 LAB — CBC
HCT: 40.9 % (ref 39.0–52.0)
Hemoglobin: 13.2 g/dL (ref 13.0–17.0)
MCH: 30 pg (ref 26.0–34.0)
MCHC: 32.3 g/dL (ref 30.0–36.0)
MCV: 93 fL (ref 80.0–100.0)
Platelets: 350 K/uL (ref 150–400)
RBC: 4.4 MIL/uL (ref 4.22–5.81)
RDW: 13.9 % (ref 11.5–15.5)
WBC: 9.2 K/uL (ref 4.0–10.5)
nRBC: 0 % (ref 0.0–0.2)

## 2024-01-10 LAB — ETHANOL: Alcohol, Ethyl (B): <15 mg/dL (ref <15)

## 2024-01-10 LAB — CBG MONITORING, ED: Glucose-Capillary: 127 mg/dL — ABNORMAL HIGH (ref 70–99)

## 2024-01-10 MED ORDER — NALOXONE HCL 2 MG/2ML IJ SOSY
PREFILLED_SYRINGE | INTRAMUSCULAR | Status: AC
  Filled 2024-01-10: qty 2

## 2024-01-10 MED ORDER — DIPHENHYDRAMINE HCL 50 MG/ML IJ SOLN
50.0000 mg | Freq: Once | INTRAMUSCULAR | Status: AC
  Filled 2024-01-10: qty 1

## 2024-01-10 MED ORDER — HALOPERIDOL LACTATE 5 MG/ML IJ SOLN
5.0000 mg | Freq: Once | INTRAMUSCULAR | Status: AC
  Filled 2024-01-10: qty 1

## 2024-01-17 ENCOUNTER — Emergency Department (HOSPITAL_COMMUNITY)
Admission: EM | Admit: 2024-01-17 | Discharge: 2024-01-17 | Discharge status: Against Medical Advice | Payer: Self-pay | Attending: Emergency Medicine | Admitting: Emergency Medicine

## 2024-01-17 ENCOUNTER — Other Ambulatory Visit: Payer: Self-pay

## 2024-01-17 DIAGNOSIS — R5383 Other fatigue: Secondary | ICD-10-CM | POA: Insufficient documentation

## 2024-01-17 DIAGNOSIS — Z5321 Procedure and treatment not carried out due to patient leaving prior to being seen by health care provider: Secondary | ICD-10-CM | POA: Insufficient documentation
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
## 2024-01-17 NOTE — ED Provider Triage Note (Signed)
 Emergency Medicine Provider Triage Evaluation Note  Joseph Nunez , a 28 y.o. male  was evaluated in triage.  Pt arrives via EMS for evaluation of Narcan  administration for suspected narcotic overdose.  He refuses any physical exam.  Asked for a sandwich.  Review of Systems  Positive: See HPI Negative:   Physical Exam  There were no vitals taken for this visit. Gen:   Awake, no distress   Resp:  Normal effort. Speaking in full and complete sentences. No signs of resp distress MSK:   Moves extremities without difficulty  Other:  A&Ox3  Medical Decision Making  Medically screening exam initiated at 10:53 PM.  Appropriate orders placed.  Joseph Nunez was informed that the remainder of the evaluation will be completed by another provider, this initial triage assessment does not replace that evaluation, and the importance of remaining in the ED until their evaluation is complete.  Was unable to assess patient beyond a no touch exam as he does not wish to be assessed.  He is fully alert and oriented and capable of making his own decisions.  He is speaking in full complete sentences and maintaining his airway without difficulty.  There is no signs of respiratory distress.  EMS vital signs are BP 114/76 heart rate 60 CBG 115 feels well.  Refuses vital signs in ED.  I discussed that it would be in his best interest for a physical exam however he refuses.  Date: 01/17/2024 Patient: Joseph Nunez Admitted: 01/17/2024 10:38 PM Attending Provider: No att. providers found  [facility] or his authorized caregiver has made the decision for the patient to leave the emergency department against the advice of No att. providers found.  He or his authorized caregiver has been informed and understands the inherent risks, including death.  He or his authorized caregiver has decided to accept the responsibility for this decision. [facility]  and all necessary parties have been advised that he may return for further evaluation or treatment. His condition at time of discharge was Stable.  [name]  There were no vitals taken for this visit.   Joseph Nunez or his authorized caregiver has not signed the Leaving Against Medical Advice form prior to leaving the department.  Tinnie FORBES Matter 01/17/2024   Matter Tinnie FORBES, PA 01/17/24 2256

## 2024-01-17 NOTE — ED Notes (Signed)
 Patient agreed to VS at this time if we give him orange juice. Orange juice provided to patient.

## 2024-01-17 NOTE — ED Notes (Addendum)
 Patient is now under arrest per PD for a warrant. PD standing outside of patient room at this time while patient speaking with charge nurse. Patient provided with a sand which.

## 2024-01-17 NOTE — ED Notes (Addendum)
 Patient refused vitals at this time.

## 2024-01-17 NOTE — ED Notes (Signed)
 Patient d/c by Charge RN Hunter at this time.

## 2024-01-17 NOTE — ED Notes (Signed)
Refused to sign AMA paperwork

## 2024-01-17 NOTE — ED Notes (Signed)
 Pt refusing to be seen at this time. A&O x4. Uncooperative with this RN. Sandwich tray provided per pt request. Pt using racial slurs and foul language towards staff and police. Ambulatory at this time.

## 2024-01-26 ENCOUNTER — Emergency Department (HOSPITAL_COMMUNITY)
Admission: EM | Admit: 2024-01-26 | Discharge: 2024-01-27 | Disposition: A | Discharge status: Home / Self Care | Payer: Self-pay | Attending: Emergency Medicine | Admitting: Emergency Medicine

## 2024-01-26 ENCOUNTER — Encounter (HOSPITAL_COMMUNITY): Payer: Self-pay

## 2024-01-26 ENCOUNTER — Other Ambulatory Visit: Payer: Self-pay

## 2024-01-26 DIAGNOSIS — F1910 Other psychoactive substance abuse, uncomplicated: Secondary | ICD-10-CM | POA: Insufficient documentation

## 2024-01-26 LAB — URINE DRUG SCREEN
Amphetamines: POSITIVE — AB
Barbiturates: NEGATIVE
Benzodiazepines: NEGATIVE
Cocaine: POSITIVE — AB
Fentanyl: POSITIVE — AB
Methadone Scn, Ur: NEGATIVE
Opiates: NEGATIVE
Tetrahydrocannabinol: NEGATIVE

## 2024-01-26 MED ORDER — OLANZAPINE 10 MG PO TBDP
10.0000 mg | ORAL_TABLET | Freq: Every day | ORAL | Status: DC
  Administered 2024-01-26: 10 mg via ORAL
  Filled 2024-01-26: qty 1

## 2024-01-26 MED ORDER — LORAZEPAM 1 MG PO TABS
1.0000 mg | ORAL_TABLET | Freq: Once | ORAL | Status: AC
  Administered 2024-01-26: 1 mg via ORAL
  Filled 2024-01-26: qty 1

## 2024-01-26 NOTE — ED Provider Notes (Signed)
 [place] EMERGENCY DEPARTMENT AT [facility] Provider Note   CSN: [id number] Arrival date & time: 01/26/24  2114     Patient presents with: Drug Problem   [facility] Joseph Nunez is a 28 y.o. male.   28 yo M with a chief complaints of altered mental status.  Patient was brought in by police for patient walking in the middle of the road.  Found to be hallucinating talking to people that were not there and had a bag of illegal substances in his mouth.   Drug Problem       Prior to Admission medications   Medication Sig Start Date End Date Taking? Authorizing Provider  amphetamine -dextroamphetamine  (ADDERALL XR) 10 MG 24 hr capsule Take 10 mg by mouth daily.    [provider]  hydrOXYzine  (VISTARIL ) 25 MG capsule Take 25 mg by mouth 3 (three) times daily as needed for anxiety.    [provider]  ibuprofen  (ADVIL ) 200 MG tablet Take 600 mg by mouth as needed for headache or moderate pain.    [provider]  ondansetron  (ZOFRAN ) 4 MG tablet Take 1 tablet (4 mg total) by mouth every 6 (six) hours. 07/24/23   Kingsley, Victoria K, DO  propranolol  (INDERAL ) 10 MG tablet Take 10 mg by mouth 3 (three) times daily.    [provider]  QUEtiapine  (SEROQUEL ) 25 MG tablet Take 1 tablet (25 mg total) by mouth daily. 02/23/22   Sherrill Cable Latif, DO  QUEtiapine  (SEROQUEL ) 50 MG tablet Take 1 tablet (50 mg total) by mouth at bedtime. 02/22/22   Sheikh, Cable Latif, DO  thiamine  (VITAMIN B-1) 100 MG tablet Take 1 tablet (100 mg total) by mouth daily. 02/23/22   Sherrill Cable Donovan, DO    Allergies: Fish allergy, Suboxone [buprenorphine hcl-naloxone  hcl], Other, and Penicillins    Review of Systems  Updated Vital Signs BP (!) 107/92   Pulse 87   Temp 97.6 F (36.4 C) (Oral)   Resp 20   Ht 5' 5 (1.651 m)   Wt 63.5 kg   SpO2 100%   BMI 23.30 kg/m   Physical Exam Vitals and nursing note reviewed.  Constitutional:      Appearance: He  is well-developed.     Comments: Patient is pacing about the room scratching his arms talking to people that are not there.  HENT:     Head: Normocephalic and atraumatic.  Eyes:     Pupils: Pupils are equal, round, and reactive to light.  Neck:     Vascular: No JVD.  Cardiovascular:     Rate and Rhythm: Normal rate and regular rhythm.     Heart sounds: No murmur heard.    No friction rub. No gallop.  Pulmonary:     Effort: No respiratory distress.     Breath sounds: No wheezing.  Abdominal:     General: There is no distension.     Tenderness: There is no abdominal tenderness. There is no guarding or rebound.  Musculoskeletal:        General: Normal range of motion.     Cervical back: Normal range of motion and neck supple.  Skin:    Coloration: Skin is not pale.     Findings: No rash.  Neurological:     Mental Status: He is alert.  Psychiatric:        Mood and Affect: Mood is anxious.        Behavior: Behavior is hyperactive.     (  all labs ordered are listed, but only abnormal results are displayed) Labs Reviewed  URINE DRUG SCREEN - Abnormal; Notable for the following components:      Result Value   Cocaine POSITIVE (*)    Amphetamines POSITIVE (*)    Fentanyl  POSITIVE (*)    All other components within normal limits  COMPREHENSIVE METABOLIC PANEL WITH GFR  ETHANOL  CBC WITH DIFFERENTIAL/PLATELET  SALICYLATE LEVEL  ACETAMINOPHEN  LEVEL    EKG: None  Radiology: No results found.   Procedures   Medications Ordered in the ED  OLANZapine  zydis (ZYPREXA ) disintegrating tablet 10 mg (10 mg Oral Given 01/26/24 2220)  LORazepam  (ATIVAN ) tablet 1 mg (1 mg Oral Given 01/26/24 2220)                                    Medical Decision Making Amount and/or Complexity of Data Reviewed Labs: ordered.  Risk Prescription drug management.   28 year old male with 12 visits in the past 6 months with a chief complaints of altered mental status.  On record review most  every one of his visits have been for acute drug ingestion.  He was found with a bag of drugs inside his mouth.  Police states that maybe he did a bump of cocaine.  Will observe in the ED.    UDS with multiple ingestants. Awaiting sobriety.   Signed out to Dr. Trine, please see their note for further details of care in the ED.  The patients results and plan were reviewed and discussed.   Any x-rays performed were independently reviewed by myself.   Differential diagnosis were considered with the presenting HPI.  Medications  OLANZapine  zydis (ZYPREXA ) disintegrating tablet 10 mg (10 mg Oral Given 01/26/24 2220)  LORazepam  (ATIVAN ) tablet 1 mg (1 mg Oral Given 01/26/24 2220)    Vitals:   01/26/24 2118 01/26/24 2136  BP:  (!) 107/92  Pulse:  87  Resp:  20  Temp:  97.6 F (36.4 C)  TempSrc:  Oral  SpO2:  100%  Weight: 63.5 kg   Height: 5' 5 (1.651 m)     Final diagnoses:  Polysubstance abuse (HCC)    Admission/ observation were discussed with the admitting physician, patient and/or family and they are comfortable with the plan.       Final diagnoses:  Polysubstance abuse [facility])    ED Discharge Orders     None          Emil Share, DO 01/26/24 2312

## 2024-01-26 NOTE — ED Notes (Signed)
 Pt actively refusing any blood work at this time.

## 2024-01-26 NOTE — ED Triage Notes (Signed)
 Patient bib EMS after being called by bystander d/t patient walking around [place] [address]. Pt unable to answer orientation questions for EMS. Patient awake, agitated, and restless. Pt rambling on arrival. Hx of drug use and drug overdose. EMS reports that while getting out of ambulance patient had a bag of drugs fall out of his mouth. Bag of drugs given to police by EMS. PT increasingly agitated afterward.

## 2024-01-27 NOTE — ED Provider Notes (Signed)
 I assumed care of this patient from previous provider.  Please see their note for further details of history, exam, and MDM.   Briefly patient is a 28 y.o. male who presented here for polysubstance use. MTF   No sober. Ambulated well. Tolerating PO  The patient appears reasonably screened and/or stabilized for discharge and I doubt any other medical condition or other [facility] requiring further screening, evaluation, or treatment in the ED at this time. I have discussed the findings, Dx and Tx plan with the patient/family who expressed understanding and agree(s) with the plan. Discharge instructions discussed at length. The patient/family was given strict return precautions who verbalized understanding of the instructions. No further questions at time of discharge.  Disposition: Discharge  Condition: Good  ED Discharge Orders     None         Follow Up: [facility] Address: [address] [place], [place] [address] Hours: Open 24 hours Monday through Sunday Phone: [phone] Go to         Trine Raynell Moder, MD 01/27/24 [phone]

## 2024-01-27 NOTE — ED Notes (Signed)
 Patient woke up and walked out to nursing station. Gait was steady. Writer gave patient graham crackers and milk. Writer attempted to assess patient's orientation and if patient is confused. Patient went immediately back to sleep. [place], MD going in to patient's room to speak with him at this time.

## 2024-01-27 NOTE — ED Notes (Signed)
 Patient refusing to let this writer obtain vitals.

## 2024-01-29 ENCOUNTER — Emergency Department (HOSPITAL_COMMUNITY)
Admission: EM | Admit: 2024-01-29 | Discharge: 2024-01-29 | Disposition: A | Discharge status: Home / Self Care | Payer: Self-pay | Attending: Emergency Medicine | Admitting: Emergency Medicine

## 2024-01-29 DIAGNOSIS — T40601A Poisoning by unspecified narcotics, accidental (unintentional), initial encounter: Secondary | ICD-10-CM | POA: Insufficient documentation

## 2024-01-29 MED ORDER — NALOXONE HCL 4 MG/0.1ML NA LIQD
1.0000 | Freq: Once | NASAL | Status: AC
  Administered 2024-01-29: 1 via NASAL

## 2024-01-29 NOTE — ED Provider Notes (Signed)
 [place] EMERGENCY DEPARTMENT AT [facility] Provider Note   CSN: [id number] Arrival date & time: 01/29/24  9050     Patient presents with: No chief complaint on file.   [facility] Joseph Nunez is a 28 y.o. male.   28 yo M with a history of polysubstance abuse comes in with a chief complaints of altered mental status.  Patient had reportedly refused Narcan  and route with EMS.  Patient unable or unwilling to provide any history.        Prior to Admission medications   Medication Sig Start Date End Date Taking? Authorizing Provider  amphetamine -dextroamphetamine  (ADDERALL XR) 10 MG 24 hr capsule Take 10 mg by mouth daily.    [provider]  hydrOXYzine  (VISTARIL ) 25 MG capsule Take 25 mg by mouth 3 (three) times daily as needed for anxiety.    [provider]  ibuprofen  (ADVIL ) 200 MG tablet Take 600 mg by mouth as needed for headache or moderate pain.    [provider]  ondansetron  (ZOFRAN ) 4 MG tablet Take 1 tablet (4 mg total) by mouth every 6 (six) hours. 07/24/23   Kingsley, Victoria K, DO  propranolol  (INDERAL ) 10 MG tablet Take 10 mg by mouth 3 (three) times daily.    [provider]  QUEtiapine  (SEROQUEL ) 25 MG tablet Take 1 tablet (25 mg total) by mouth daily. 02/23/22   Sheikh, Omair Latif, DO  QUEtiapine  (SEROQUEL ) 50 MG tablet Take 1 tablet (50 mg total) by mouth at bedtime. 02/22/22   Sheikh, Alejandro Latif, DO  thiamine  (VITAMIN B-1) 100 MG tablet Take 1 tablet (100 mg total) by mouth daily. 02/23/22   Sheikh, Omair Latif, DO    Allergies: Fish allergy, Suboxone [buprenorphine hcl-naloxone  hcl], Other, and Penicillins    Review of Systems  Updated Vital Signs BP 137/80 (BP Location: Right Arm)   Pulse 90   Resp 10   SpO2 100%   Physical Exam Vitals and nursing note reviewed.  Constitutional:      Appearance: He is well-developed.  HENT:     Head: Normocephalic and atraumatic.  Eyes:     Pupils: Pupils are  equal, round, and reactive to light.  Neck:     Vascular: No JVD.  Cardiovascular:     Rate and Rhythm: Normal rate and regular rhythm.     Heart sounds: No murmur heard.    No friction rub. No gallop.  Pulmonary:     Effort: No respiratory distress.     Breath sounds: No wheezing.  Abdominal:     General: There is no distension.     Tenderness: There is no abdominal tenderness. There is no guarding or rebound.  Musculoskeletal:        General: Normal range of motion.     Cervical back: Normal range of motion and neck supple.  Skin:    Coloration: Skin is not pale.     Findings: No rash.  Neurological:     Comments: Opens eyes to voice, moves all 4 extremities spontaneously  Psychiatric:        Behavior: Behavior normal.     (all labs ordered are listed, but only abnormal results are displayed) Labs Reviewed - No data to display  EKG: None  Radiology: No results found.   .Critical Care  Performed by: Emil Share, DO Authorized by: Emil Share, DO   Critical care provider statement:    Critical care time (minutes):  35   Critical care time  was exclusive of:  Separately billable procedures and treating other patients   Critical care was time spent personally by me on the following activities:  Development of treatment plan with patient or surrogate, discussions with consultants, evaluation of patient's response to treatment, examination of patient, ordering and review of laboratory studies, ordering and review of radiographic studies, ordering and performing treatments and interventions, pulse oximetry, re-evaluation of patient's condition and review of old charts   Care discussed with: admitting provider      Medications Ordered in the ED  naloxone  (NARCAN ) nasal spray 4 mg/0.1 mL (has no administration in time range)                                    Medical Decision Making  28 yo M well-known to this emergency department with 12 is in the past 6 months and I  personally saw the patient about 48 hours ago comes in with a chief complaint of suspected drug ingestion.  Patient having some diminished respiratory effort will give Narcan  here.  Patient with significant improvement of his mental status post Narcan .  He endorses that he would like to leave.  Had a discussion at bedside about risk of possible recurrent somnolence once the Narcan  wears off.  Patient stated understanding, stated that he understands it may kill him.  Given a list of outpatient resources.  Narcan  kit for home.  10:04 AM:  I have discussed the diagnosis/risks/treatment options with the patient.  Evaluation and diagnostic testing in the emergency department does not suggest an emergent condition requiring admission or immediate intervention beyond what has been performed at this time.  They will follow up with PCP. We also discussed returning to the ED immediately if new or worsening sx occur. We discussed the sx which are most concerning (e.g., sudden worsening pain, fever, inability to tolerate by mouth) that necessitate immediate return. Medications administered to the patient during their visit and any new prescriptions provided to the patient are listed below.  Medications given during this visit Medications  naloxone  (NARCAN ) nasal spray 4 mg/0.1 mL (has no administration in time range)     The patient appears reasonably screen and/or stabilized for discharge and I doubt any other medical condition or other [facility] requiring further screening, evaluation, or treatment in the ED at this time prior to discharge.       Final diagnoses:  Opiate overdose, accidental or unintentional, initial encounter [facility])    ED Discharge Orders     None          Emil Share, DO 01/29/24 1004
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
## 2024-01-29 NOTE — Discharge Instructions (Signed)
There is help if you need it.  Please do not use dirty needles, this could cause you a severe infection to your skin, heart or spinal cord.  This could kill you or leave you permanently disabled.  There was a recent study done at [facility] that showed that the risk of death for someone that had unintentionally overdosed on narcotics was as high as 15% in the next year.  This is much higher than most every other medical condition.  [place] Solution to the Opioid Problem ([facility]STOP) Fixed; mobile; peer-based Chase Holleman [phone] [email or web address] Fixed site exchange at [employer], Wednesdays (2-5pm) and Thursdays (4-8pm). [address] [place], [place] [address] Call or text to arrange mobile and peer exchange, Mondays (1-4pm) and Fridays (4-7pm). Serving [place] [email or web address]  Suboxone clinic: [facility] [address] [place], [address]  [facility] [address] [place], [address]  [facility] [address] [place] [address]  

## 2024-01-29 NOTE — ED Notes (Signed)
 Patient continually not following commands or answering questions. Attempted IV access. Unable to obtain. Labs drawn. Provider at bedside to give verbal order for intranasal narcan . After administration of narcan  patient noted to be more cooperative, coherent, and alert. Apnea resolved. Patient verbalized desire to leave department while EDP at bedside. EDP agreed to discharge patient. Patent left before home narcan  could be provided to him.

## 2024-04-09 ENCOUNTER — Emergency Department (HOSPITAL_COMMUNITY)
Admission: EM | Admit: 2024-04-09 | Discharge: 2024-04-09 | Disposition: A | Discharge status: Home / Self Care | Payer: Self-pay | Attending: Emergency Medicine | Admitting: Emergency Medicine

## 2024-04-09 DIAGNOSIS — T40491A Poisoning by other synthetic narcotics, accidental (unintentional), initial encounter: Secondary | ICD-10-CM | POA: Insufficient documentation

## 2024-04-09 DIAGNOSIS — T405X1A Poisoning by cocaine, accidental (unintentional), initial encounter: Secondary | ICD-10-CM | POA: Insufficient documentation

## 2024-04-09 DIAGNOSIS — T402X1A Poisoning by other opioids, accidental (unintentional), initial encounter: Secondary | ICD-10-CM | POA: Insufficient documentation

## 2024-04-09 LAB — URINE DRUG SCREEN
Amphetamines: NEGATIVE
Barbiturates: NEGATIVE
Benzodiazepines: NEGATIVE
Cocaine: POSITIVE — AB
Fentanyl: POSITIVE — AB
Methadone Scn, Ur: NEGATIVE
Opiates: NEGATIVE
Tetrahydrocannabinol: NEGATIVE

## 2024-04-09 MED ORDER — NALOXONE HCL 4 MG/0.1ML NA LIQD: 1.0000 | Freq: Once | NASAL | Status: DC

## 2024-04-09 NOTE — Discharge Instructions (Addendum)
There is help if you need it.  Please do not use dirty needles, this could cause you a severe infection to your skin, heart or spinal cord.  This could kill you or leave you permanently disabled.  There was a recent study done at [facility] that showed that the risk of death for someone that had unintentionally overdosed on narcotics was as high as 15% in the next year.  This is much higher than most every other medical condition.  [place] Solution to the Opioid Problem ([facility]STOP) Fixed; mobile; peer-based Chase Holleman [phone] [email or web address] Fixed site exchange at [employer], Wednesdays (2-5pm) and Thursdays (4-8pm). [address] [place], [place] [address] Call or text to arrange mobile and peer exchange, Mondays (1-4pm) and Fridays (4-7pm). Serving [place] [email or web address]  Suboxone clinic: [facility] [address] [place], [address]  [facility] [address] [place], [address]  [facility] [address] [place] [address]  

## 2024-04-09 NOTE — ED Notes (Signed)
Pt refusing to answer assessment questions.

## 2024-04-09 NOTE — ED Provider Notes (Signed)
 [place] EMERGENCY DEPARTMENT AT [facility] Provider Note   CSN: [id number] Arrival date & time: 04/09/24  1123     Patient presents with: Drug Problem   [facility] Joseph Nunez is a 28 y.o. male.   28 yo M with a chief complaints of what was thought to be an unintentional overdose.  Patient has a history of the same.  Has been seen in this facility multiple times previously.  Patient is quite sleepy and unable to provide any history.  Level 5 caveat   Drug Problem       Prior to Admission medications   Medication Sig Start Date End Date Taking? Authorizing Provider  amphetamine -dextroamphetamine  (ADDERALL XR) 10 MG 24 hr capsule Take 10 mg by mouth daily.    [provider]  hydrOXYzine  (VISTARIL ) 25 MG capsule Take 25 mg by mouth 3 (three) times daily as needed for anxiety.    [provider]  ibuprofen  (ADVIL ) 200 MG tablet Take 600 mg by mouth as needed for headache or moderate pain.    [provider]  ondansetron  (ZOFRAN ) 4 MG tablet Take 1 tablet (4 mg total) by mouth every 6 (six) hours. 07/24/23   Kingsley, Victoria K, DO  propranolol  (INDERAL ) 10 MG tablet Take 10 mg by mouth 3 (three) times daily.    [provider]  QUEtiapine  (SEROQUEL ) 25 MG tablet Take 1 tablet (25 mg total) by mouth daily. 02/23/22   Sheikh, Omair Latif, DO  QUEtiapine  (SEROQUEL ) 50 MG tablet Take 1 tablet (50 mg total) by mouth at bedtime. 02/22/22   Sheikh, Alejandro Latif, DO  thiamine  (VITAMIN B-1) 100 MG tablet Take 1 tablet (100 mg total) by mouth daily. 02/23/22   Sheikh, Omair Latif, DO    Allergies: Fish allergy, Suboxone [buprenorphine hcl-naloxone  hcl], Other, and Penicillins    Review of Systems  Updated Vital Signs BP 120/83 (BP Location: Left Arm)   Pulse 96   Temp 98.4 F (36.9 C) (Oral)   Resp 12   SpO2 92%   Physical Exam Vitals and nursing note reviewed.  Constitutional:      Appearance: He is well-developed.      Comments: Sleepy awakens to voice and light touch  HENT:     Head: Normocephalic and atraumatic.  Eyes:     Pupils: Pupils are equal, round, and reactive to light.  Neck:     Vascular: No JVD.  Cardiovascular:     Rate and Rhythm: Normal rate and regular rhythm.     Heart sounds: No murmur heard.    No friction rub. No gallop.  Pulmonary:     Effort: No respiratory distress.     Breath sounds: No wheezing.  Abdominal:     General: There is no distension.     Tenderness: There is no abdominal tenderness. There is no guarding or rebound.  Musculoskeletal:        General: Normal range of motion.     Cervical back: Normal range of motion and neck supple.  Skin:    Coloration: Skin is not pale.     Findings: No rash.  Neurological:     Comments: Confused verbal response  Psychiatric:        Behavior: Behavior normal.     (all labs ordered are listed, but only abnormal results are displayed) Labs Reviewed  URINE DRUG SCREEN - Abnormal; Notable for the following components:      Result Value   Cocaine POSITIVE (*)  Fentanyl  POSITIVE (*)    All other components within normal limits    EKG: None  Radiology: No results found.   Procedures   Medications Ordered in the ED  naloxone  (NARCAN ) nasal spray 4 mg/0.1 mL (has no administration in time range)                                    Medical Decision Making Amount and/or Complexity of Data Reviewed Labs: ordered.   28 yo M well-known to this emergency department with 14 visits in the past 6 months comes in with a chief complaints of a presumed unintentional drug overdose.  Patient has been seen almost exclusively for the same.  I saw the patient personally in the last 2 times he was seen in the ED.  Pinpoint pupils.  Awaken spontaneously to voice or light touch.  Will continue to observe in the ED.  Patient is now awake able to ambulate requesting for food and drink.  Will discharge home.  Narcan  kit for home.   Given a list of outpatient resources.  1:42 PM:  I have discussed the diagnosis/risks/treatment options with the patient.  Evaluation and diagnostic testing in the emergency department does not suggest an emergent condition requiring admission or immediate intervention beyond what has been performed at this time.  They will follow up with PCP. We also discussed returning to the ED immediately if new or worsening sx occur. We discussed the sx which are most concerning (e.g., sudden worsening pain, fever, inability to tolerate by mouth) that necessitate immediate return. Medications administered to the patient during their visit and any new prescriptions provided to the patient are listed below.  Medications given during this visit Medications  naloxone  (NARCAN ) nasal spray 4 mg/0.1 mL (has no administration in time range)     The patient appears reasonably screen and/or stabilized for discharge and I doubt any other medical condition or other [facility] requiring further screening, evaluation, or treatment in the ED at this time prior to discharge.       Final diagnoses:  Opioid overdose, accidental or unintentional, initial encounter [facility])    ED Discharge Orders     None          Emil Share, DO 04/09/24 1342

## 2024-04-09 NOTE — ED Notes (Signed)
 Pt walked out of the ER without discharge paperwork, and without repeat vital signs.   Pt had all belongings.

## 2024-04-09 NOTE — ED Notes (Signed)
 Pt removed all monitoring and oxygen, climbed out of bed and is currently sitting in a chair. Pt noted to be dozing off.

## 2024-04-09 NOTE — ED Triage Notes (Signed)
 Pt arrives via EMS from [facility] lot of [facility]. Pt was reportedly unresponsive in the parking lot. EMS reported being familiar with the pt, and report poly substance abuse with frequent overdoses. PT somnolent, but combative when aroused. Pt pulled off all monitoring, and swatted at staff. PT refusing to wear nasal cannula. Pt refusing to remove large coat.

## 2024-04-12 ENCOUNTER — Emergency Department (HOSPITAL_COMMUNITY)
Admission: EM | Admit: 2024-04-12 | Discharge: 2024-04-12 | Discharge status: Absent without leave | Payer: Self-pay | Source: Home / Self Care

## 2024-04-12 ENCOUNTER — Emergency Department (HOSPITAL_COMMUNITY)
Admission: EM | Admit: 2024-04-12 | Discharge: 2024-04-13 | Disposition: A | Discharge status: Home / Self Care | Payer: Self-pay | Attending: Emergency Medicine | Admitting: Emergency Medicine

## 2024-04-12 DIAGNOSIS — Z5900 Homelessness unspecified: Secondary | ICD-10-CM | POA: Insufficient documentation

## 2024-04-12 NOTE — ED Triage Notes (Signed)
 Brought in by BIBA, EMS stated that pt was located at the [employer] sleeping, bystander was concerned he was unable to be woken. Upon EMS arrival pt was sitting up taking to PD. Pt was AMS for EMS knew his name and city but refused to answer any other questions for EMS. Pt is alert at this time, not cooperative with hospital staff.

## 2024-04-12 NOTE — ED Provider Notes (Signed)
 [place] EMERGENCY DEPARTMENT AT [facility] Provider Note   CSN: [id number] Arrival date & time: 04/12/24  2237     Patient presents with: Drug / Alcohol Assessment   [facility] Joseph Nunez is a 28 y.o. male.  {Add pertinent medical, surgical, social history, OB history to YEP:67052} The history is provided by the patient and medical records.  Drug / Alcohol Assessment  28 year old male with history of bipolar disorder, polysubstance abuse, presenting to the ED after being found sleeping in a local [employer].  Bystander reported he was hard to arouse so called EMS.  He was able to converse with PD on scene but refusing to talk to EMS.  He refuses to answer questions on arrival to ED.  States he just hurts everywhere.    Prior to Admission medications   Medication Sig Start Date End Date Taking? Authorizing Provider  amphetamine -dextroamphetamine  (ADDERALL XR) 10 MG 24 hr capsule Take 10 mg by mouth daily.    [provider]  hydrOXYzine  (VISTARIL ) 25 MG capsule Take 25 mg by mouth 3 (three) times daily as needed for anxiety.    [provider]  ibuprofen  (ADVIL ) 200 MG tablet Take 600 mg by mouth as needed for headache or moderate pain.    [provider]  ondansetron  (ZOFRAN ) 4 MG tablet Take 1 tablet (4 mg total) by mouth every 6 (six) hours. 07/24/23   Kingsley, Victoria K, DO  propranolol  (INDERAL ) 10 MG tablet Take 10 mg by mouth 3 (three) times daily.    [provider]  QUEtiapine  (SEROQUEL ) 25 MG tablet Take 1 tablet (25 mg total) by mouth daily. 02/23/22   Sherrill Cable Latif, DO  QUEtiapine  (SEROQUEL ) 50 MG tablet Take 1 tablet (50 mg total) by mouth at bedtime. 02/22/22   Sheikh, Cable Latif, DO  thiamine  (VITAMIN B-1) 100 MG tablet Take 1 tablet (100 mg total) by mouth daily. 02/23/22   Sheikh, Omair Latif, DO    Allergies: Fish allergy, Suboxone [buprenorphine hcl-naloxone  hcl], Other, and Penicillins    Review  of Systems  Constitutional:        Found sleeping in [employer]  All other systems reviewed and are negative.   Updated Vital Signs BP 109/72   Pulse 71   Temp 98.4 F (36.9 C) (Oral)   Resp 18   SpO2 100%   Physical Exam Vitals and nursing note reviewed.  Constitutional:      Appearance: He is well-developed.     Comments: Sleeping, arouses when spoken to but rolls over and puts blanket over his head, refuses to answer questions Disheveled appearing  HENT:     Head: Normocephalic and atraumatic.     Comments: No visible head trauma Eyes:     Conjunctiva/sclera: Conjunctivae normal.     Pupils: Pupils are equal, round, and reactive to light.  Cardiovascular:     Rate and Rhythm: Normal rate and regular rhythm.     Heart sounds: Normal heart sounds.  Pulmonary:     Effort: Pulmonary effort is normal.     Breath sounds: Normal breath sounds.  Abdominal:     General: Bowel sounds are normal.     Palpations: Abdomen is soft.  Musculoskeletal:        General: Normal range of motion.     Cervical back: Normal range of motion.  Skin:    General: Skin is warm and dry.     (all labs ordered are listed, but only  abnormal results are displayed) Labs Reviewed - No data to display  EKG: None  Radiology: No results found.  {Document cardiac monitor, telemetry assessment procedure when appropriate:32947} Procedures   Medications Ordered in the ED - No data to display    {Click here for ABCD2, HEART and other calculators REFRESH Note before signing:1}                              Medical Decision Making  ***  {Document critical care time when appropriate  Document review of labs and clinical decision tools ie CHADS2VASC2, etc  Document your independent review of radiology images and any outside records  Document your discussion with family members, caretakers and with consultants  Document social determinants of health affecting pt's care  Document your  decision making why or why not admission, treatments were needed:32947:::1}   Final diagnoses:  None    ED Discharge Orders     None

## 2024-04-12 NOTE — ED Notes (Signed)
 Pt not answering any triage question except for pain assessment

## 2024-04-13 NOTE — ED Notes (Signed)
 Pt is continuing spitting on myself and area around him. Pt was given an emesis bag.

## 2024-04-27 ENCOUNTER — Encounter (HOSPITAL_COMMUNITY): Payer: Self-pay

## 2024-04-27 ENCOUNTER — Other Ambulatory Visit: Payer: Self-pay

## 2024-04-27 ENCOUNTER — Inpatient Hospital Stay (HOSPITAL_COMMUNITY)
Admission: AD | Admit: 2024-04-27 | Discharge: 2024-05-01 | DRG: 885 | Disposition: A | Source: Intra-hospital | Admitting: Psychiatry

## 2024-04-27 ENCOUNTER — Emergency Department (HOSPITAL_COMMUNITY)
Admission: EM | Admit: 2024-04-27 | Discharge: 2024-04-27 | Disposition: A | Discharge status: Other Acute Inpatient Hospital | Payer: Self-pay | Attending: Emergency Medicine | Admitting: Emergency Medicine

## 2024-04-27 DIAGNOSIS — Z833 Family history of diabetes mellitus: Secondary | ICD-10-CM | POA: Diagnosis not present

## 2024-04-27 DIAGNOSIS — F319 Bipolar disorder, unspecified: Secondary | ICD-10-CM | POA: Diagnosis present

## 2024-04-27 DIAGNOSIS — F17213 Nicotine dependence, cigarettes, with withdrawal: Secondary | ICD-10-CM | POA: Diagnosis present

## 2024-04-27 DIAGNOSIS — F23 Brief psychotic disorder: Secondary | ICD-10-CM

## 2024-04-27 DIAGNOSIS — Z87892 Personal history of anaphylaxis: Secondary | ICD-10-CM | POA: Diagnosis not present

## 2024-04-27 DIAGNOSIS — Z91013 Allergy to seafood: Secondary | ICD-10-CM | POA: Diagnosis not present

## 2024-04-27 DIAGNOSIS — Z560 Unemployment, unspecified: Secondary | ICD-10-CM | POA: Diagnosis not present

## 2024-04-27 DIAGNOSIS — Z5900 Homelessness unspecified: Secondary | ICD-10-CM | POA: Diagnosis not present

## 2024-04-27 DIAGNOSIS — Z79899 Other long term (current) drug therapy: Secondary | ICD-10-CM | POA: Diagnosis not present

## 2024-04-27 DIAGNOSIS — Z880 Allergy status to penicillin: Secondary | ICD-10-CM | POA: Diagnosis not present

## 2024-04-27 DIAGNOSIS — R7401 Elevation of levels of liver transaminase levels: Secondary | ICD-10-CM

## 2024-04-27 DIAGNOSIS — Z8614 Personal history of Methicillin resistant Staphylococcus aureus infection: Secondary | ICD-10-CM | POA: Diagnosis not present

## 2024-04-27 DIAGNOSIS — Z596 Low income: Secondary | ICD-10-CM | POA: Diagnosis not present

## 2024-04-27 DIAGNOSIS — F1910 Other psychoactive substance abuse, uncomplicated: Secondary | ICD-10-CM

## 2024-04-27 DIAGNOSIS — F1990 Other psychoactive substance use, unspecified, uncomplicated: Secondary | ICD-10-CM | POA: Diagnosis not present

## 2024-04-27 DIAGNOSIS — F15159 Other stimulant abuse with stimulant-induced psychotic disorder, unspecified: Secondary | ICD-10-CM | POA: Diagnosis present

## 2024-04-27 LAB — URINE DRUG SCREEN
Amphetamines: NEGATIVE
Amphetamines: NEGATIVE
Barbiturates: NEGATIVE
Barbiturates: NEGATIVE
Benzodiazepines: NEGATIVE
Benzodiazepines: NEGATIVE
Cocaine: POSITIVE — AB
Cocaine: POSITIVE — AB
Fentanyl: POSITIVE — AB
Fentanyl: POSITIVE — AB
Methadone Scn, Ur: NEGATIVE
Methadone Scn, Ur: NEGATIVE
Opiates: NEGATIVE
Opiates: NEGATIVE
Tetrahydrocannabinol: NEGATIVE
Tetrahydrocannabinol: NEGATIVE

## 2024-04-27 LAB — CBC
HCT: 40.9 % (ref 39.0–52.0)
Hemoglobin: 13.8 g/dL (ref 13.0–17.0)
MCH: 30.9 pg (ref 26.0–34.0)
MCHC: 33.7 g/dL (ref 30.0–36.0)
MCV: 91.5 fL (ref 80.0–100.0)
Platelets: 343 K/uL (ref 150–400)
RBC: 4.47 MIL/uL (ref 4.22–5.81)
RDW: 12.7 % (ref 11.5–15.5)
WBC: 11.6 K/uL — ABNORMAL HIGH (ref 4.0–10.5)
nRBC: 0 % (ref 0.0–0.2)

## 2024-04-27 LAB — COMPREHENSIVE METABOLIC PANEL WITH GFR
ALT: 61 U/L — ABNORMAL HIGH (ref 0–44)
AST: 54 U/L — ABNORMAL HIGH (ref 15–41)
Albumin: 4.5 g/dL (ref 3.5–5.0)
Alkaline Phosphatase: 72 U/L (ref 38–126)
Anion gap: 11 (ref 5–15)
BUN: 14 mg/dL (ref 6–20)
CO2: 29 mmol/L (ref 22–32)
Calcium: 9.5 mg/dL (ref 8.9–10.3)
Chloride: 100 mmol/L (ref 98–111)
Creatinine, Ser: 0.76 mg/dL (ref 0.61–1.24)
GFR, Estimated: >60 mL/min (ref >60)
Glucose, Bld: 87 mg/dL (ref 70–99)
Potassium: 4.2 mmol/L (ref 3.5–5.1)
Sodium: 140 mmol/L (ref 135–145)
Total Bilirubin: 0.5 mg/dL (ref 0.0–1.2)
Total Protein: 7.3 g/dL (ref 6.5–8.1)

## 2024-04-27 LAB — ETHANOL: Alcohol, Ethyl (B): <15 mg/dL (ref <15)

## 2024-04-27 MED ORDER — DIPHENHYDRAMINE HCL 50 MG/ML IJ SOLN: 50.0000 mg | Freq: Three times a day (TID) | INTRAMUSCULAR | Status: DC | PRN

## 2024-04-27 MED ORDER — PROPRANOLOL HCL 20 MG PO TABS
10.0000 mg | ORAL_TABLET | Freq: Three times a day (TID) | ORAL | Status: DC
  Filled 2024-04-27: qty 1

## 2024-04-27 MED ORDER — DIPHENHYDRAMINE HCL 25 MG PO CAPS
50.0000 mg | ORAL_CAPSULE | Freq: Three times a day (TID) | ORAL | Status: DC | PRN
  Administered 2024-04-28: 50 mg via ORAL
  Filled 2024-04-27: qty 2

## 2024-04-27 MED ORDER — QUETIAPINE FUMARATE 50 MG PO TABS
50.0000 mg | ORAL_TABLET | Freq: Every day | ORAL | Status: DC
  Administered 2024-04-27: 50 mg via ORAL
  Filled 2024-04-27: qty 1

## 2024-04-27 MED ORDER — ACETAMINOPHEN 325 MG PO TABS: 650.0000 mg | ORAL_TABLET | Freq: Four times a day (QID) | ORAL | Status: DC | PRN

## 2024-04-27 MED ORDER — QUETIAPINE FUMARATE 25 MG PO TABS
25.0000 mg | ORAL_TABLET | Freq: Every day | ORAL | Status: DC
  Administered 2024-04-28: 25 mg via ORAL
  Filled 2024-04-27: qty 1

## 2024-04-27 MED ORDER — NALOXONE HCL 2 MG/2ML IJ SOSY
PREFILLED_SYRINGE | INTRAMUSCULAR | Status: AC
  Filled 2024-04-27: qty 2

## 2024-04-27 MED ORDER — ONDANSETRON HCL 4 MG PO TABS: 4.0000 mg | ORAL_TABLET | Freq: Three times a day (TID) | ORAL | Status: DC | PRN

## 2024-04-27 MED ORDER — NALOXONE HCL 4 MG/0.1ML NA LIQD
1.0000 | Freq: Once | NASAL | Status: AC
  Administered 2024-04-27: 1 via NASAL
  Filled 2024-04-27: qty 4

## 2024-04-27 MED ORDER — HALOPERIDOL LACTATE 5 MG/ML IJ SOLN: 10.0000 mg | Freq: Three times a day (TID) | INTRAMUSCULAR | Status: DC | PRN

## 2024-04-27 MED ORDER — THIAMINE MONONITRATE 100 MG PO TABS
100.0000 mg | ORAL_TABLET | Freq: Every day | ORAL | Status: DC
  Administered 2024-04-27: 100 mg via ORAL
  Filled 2024-04-27: qty 1

## 2024-04-27 MED ORDER — ALUM & MAG HYDROXIDE-SIMETH 200-200-20 MG/5ML PO SUSP: 30.0000 mL | Freq: Four times a day (QID) | ORAL | Status: DC | PRN

## 2024-04-27 MED ORDER — MAGNESIUM HYDROXIDE 400 MG/5ML PO SUSP
30.0000 mL | Freq: Every day | ORAL | Status: DC | PRN
  Administered 2024-04-30: 30 mL via ORAL
  Filled 2024-04-27: qty 30

## 2024-04-27 MED ORDER — LORAZEPAM 2 MG/ML IJ SOLN: 2.0000 mg | Freq: Three times a day (TID) | INTRAMUSCULAR | Status: DC | PRN

## 2024-04-27 MED ORDER — ALUM & MAG HYDROXIDE-SIMETH 200-200-20 MG/5ML PO SUSP
30.0000 mL | ORAL | Status: DC | PRN
  Administered 2024-04-29: 30 mL via ORAL
  Filled 2024-04-27: qty 30

## 2024-04-27 MED ORDER — HALOPERIDOL LACTATE 5 MG/ML IJ SOLN: 5.0000 mg | Freq: Three times a day (TID) | INTRAMUSCULAR | Status: DC | PRN

## 2024-04-27 MED ORDER — NICOTINE 7 MG/24HR TD PT24
7.0000 mg | MEDICATED_PATCH | Freq: Every day | TRANSDERMAL | Status: DC
  Administered 2024-04-27: 7 mg via TRANSDERMAL
  Filled 2024-04-27: qty 1

## 2024-04-27 MED ORDER — HALOPERIDOL 5 MG PO TABS
5.0000 mg | ORAL_TABLET | Freq: Three times a day (TID) | ORAL | Status: DC | PRN
  Administered 2024-04-28: 5 mg via ORAL
  Filled 2024-04-27: qty 1

## 2024-04-27 MED ORDER — ZIPRASIDONE MESYLATE 20 MG IM SOLR: 15.0000 mg | Freq: Once | INTRAMUSCULAR | Status: AC

## 2024-04-27 MED ORDER — ACETAMINOPHEN 325 MG PO TABS: 650.0000 mg | ORAL_TABLET | ORAL | Status: DC | PRN

## 2024-04-27 MED ORDER — HYDROXYZINE HCL 25 MG PO TABS
25.0000 mg | ORAL_TABLET | Freq: Three times a day (TID) | ORAL | Status: DC | PRN
  Administered 2024-04-27 – 2024-04-30 (×4): 25 mg via ORAL
  Filled 2024-04-27 (×4): qty 1

## 2024-04-27 MED ORDER — QUETIAPINE FUMARATE 50 MG PO TABS: 50.0000 mg | ORAL_TABLET | Freq: Every day | ORAL | Status: DC

## 2024-04-27 MED ORDER — QUETIAPINE FUMARATE 25 MG PO TABS
25.0000 mg | ORAL_TABLET | Freq: Every day | ORAL | Status: DC
  Administered 2024-04-27: 25 mg via ORAL
  Filled 2024-04-27: qty 1

## 2024-04-27 MED ORDER — ZIPRASIDONE MESYLATE 20 MG IM SOLR
INTRAMUSCULAR | Status: AC
  Administered 2024-04-27: 15 mg via INTRAMUSCULAR
  Filled 2024-04-27: qty 20

## 2024-04-27 MED ORDER — STERILE WATER FOR INJECTION IJ SOLN
INTRAMUSCULAR | Status: AC
  Filled 2024-04-27: qty 10

## 2024-04-27 MED ORDER — PROPRANOLOL HCL 10 MG PO TABS
10.0000 mg | ORAL_TABLET | Freq: Three times a day (TID) | ORAL | Status: DC
  Administered 2024-04-28 – 2024-05-01 (×9): 10 mg via ORAL
  Filled 2024-04-27 (×10): qty 1

## 2024-04-27 NOTE — Progress Notes (Addendum)
 Pt has been accepted to [facility] on 04/27/2024 Bed assignment: 405-01  Pt meets inpatient criteria per; Cathaleen Jacobson   Attending Physician will be: N/A   Report can be called to: [facility] GAVE TO RN  Pt can arrive after [facility] WILL UPDATE   Care Team Notified: [facility] Cherylynn Ernst RN, [facility] NP, Chesley Holt [facility]  [place] Ellanora Rayborn LCSW-A   04/27/2024 2:16 PM
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
## 2024-04-27 NOTE — ED Notes (Addendum)
 Called report to [facility], Called [employer] for transport.

## 2024-04-27 NOTE — ED Notes (Signed)
Patient was given a snack. 

## 2024-04-27 NOTE — ED Notes (Signed)
 Restraint documentation overdue was from day shift. This was prior to this writers shift.

## 2024-04-27 NOTE — ED Notes (Signed)
[employer] here to transport patient to [facility].

## 2024-04-27 NOTE — ED Notes (Addendum)
 Pt found holding an unknown substance and sniffing.  [facility] ER staff witnessed and asked patient what it was.  PT attempted to hide whatever the substance was, security called.  Off-duty PD to bedside along with security.  Pt admitted to it being coke then said no, coca-cola..  Pt began to nod off while sitting and while standing.  Pulse oxygenation 85%.  Went to Dr. Raford to request Narcan .  Order placed.

## 2024-04-27 NOTE — BH Assessment (Signed)
 TTS attempted to see pt. [name],RN informed TTS that pt is unable to participate in assessment due to pt just seen sniffing an unknown substance and is currently in and out of sleep. TTS will be notified once pt is awake and alert for assessment.

## 2024-04-27 NOTE — ED Notes (Signed)
 Security at bedside.  After administering Narcan  nasally, pt became more undirectable, unsuccessful in redirected. Pt attempted to run out of ER, pt returned to stretcher safely by security.  Oxygen level currently 99% RA

## 2024-04-28 ENCOUNTER — Encounter (HOSPITAL_COMMUNITY): Payer: Self-pay | Admitting: Psychiatry

## 2024-04-28 LAB — LIPID PANEL
Cholesterol: 147 mg/dL (ref 0–200)
HDL: 58 mg/dL (ref >40)
LDL Cholesterol: 67 mg/dL (ref 0–99)
Total CHOL/HDL Ratio: 2.6 ratio
Triglycerides: 112 mg/dL (ref <150)
VLDL: 22 mg/dL (ref 0–40)

## 2024-04-28 LAB — TSH: TSH: 2.33 u[IU]/mL (ref 0.350–4.500)

## 2024-04-28 MED ORDER — NICOTINE 14 MG/24HR TD PT24
14.0000 mg | MEDICATED_PATCH | Freq: Every day | TRANSDERMAL | Status: DC
  Administered 2024-04-28 – 2024-05-01 (×3): 14 mg via TRANSDERMAL
  Filled 2024-04-28 (×4): qty 1

## 2024-04-28 MED ORDER — CLONIDINE HCL 0.1 MG PO TABS
0.1000 mg | ORAL_TABLET | ORAL | Status: DC | PRN
  Administered 2024-04-29 (×2): 0.1 mg via ORAL
  Filled 2024-04-28 (×2): qty 1

## 2024-04-28 MED ORDER — QUETIAPINE FUMARATE 300 MG PO TABS
300.0000 mg | ORAL_TABLET | Freq: Every day | ORAL | Status: DC
  Administered 2024-04-28: 300 mg via ORAL
  Filled 2024-04-28: qty 1

## 2024-04-28 MED ORDER — ONDANSETRON 4 MG PO TBDP: 4.0000 mg | ORAL_TABLET | Freq: Four times a day (QID) | ORAL | Status: DC | PRN

## 2024-04-28 MED ORDER — NICOTINE 14 MG/24HR TD PT24
14.0000 mg | MEDICATED_PATCH | Freq: Every day | TRANSDERMAL | Status: DC
  Administered 2024-04-28: 14 mg via TRANSDERMAL

## 2024-04-28 MED ORDER — METHOCARBAMOL 500 MG PO TABS: 500.0000 mg | ORAL_TABLET | Freq: Three times a day (TID) | ORAL | Status: DC | PRN

## 2024-04-28 MED ORDER — DICYCLOMINE HCL 20 MG PO TABS: 20.0000 mg | ORAL_TABLET | Freq: Four times a day (QID) | ORAL | Status: DC | PRN

## 2024-04-28 MED ORDER — LOPERAMIDE HCL 2 MG PO CAPS: 2.0000 mg | ORAL_CAPSULE | ORAL | Status: DC | PRN

## 2024-04-28 MED ORDER — NAPROXEN 500 MG PO TABS: 500.0000 mg | ORAL_TABLET | Freq: Two times a day (BID) | ORAL | Status: DC | PRN

## 2024-04-28 NOTE — Group Note (Signed)
 Date:  04/28/2024 Time:  8:38 PM  Group Topic/Focus:  Wrap-Up Group:   The focus of this group is to help patients review their daily goal of treatment and discuss progress on daily workbooks.    Participation Level:  Did Not Attend  Participation Quality:  Did Not Attend  Affect:  Did Not Attend  Cognitive:  Did Not Attend   Insight: None  Engagement in Group:  Did Not Attend  Modes of Intervention:  Did Not Attend  Additional Comments:  Pt was encouraged to attend wrap up group but did not attend  Lonni Na 04/28/2024, 8:38 PM

## 2024-04-28 NOTE — Group Note (Signed)
 Date:  04/28/2024 Time:  9:52 AM  Group Topic/Focus: Coping skills orientation group assessment Coping With Mental Health Crisis:   The purpose of this group is to help patients identify strategies for coping with mental health crisis.  Group discusses possible causes of crisis and ways to manage them effectively. We also created a fun way to practice managing stress, emotions, and challenges! You could approach this in different ways depending on whether you're playing alone, with others, or looking to use it as a tool for therapy or personal growth.    Participation Level:  Did Not Attend   Joseph Nunez 04/28/2024, 9:52 AM
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
## 2024-04-28 NOTE — Group Note (Signed)
 Date:  04/28/2024 Time:  10:07 AM  Group Topic/Focus: Pet therapy Pet therapy, or animal-assisted therapy, has become an increasingly popular and effective method for improving mental health in adults. The presence of animals can provide emotional support, reduce stress, and enhance overall well-being.    Participation Level:  Did Not Attend  Joseph Nunez 04/28/2024, 10:07 AM

## 2024-04-28 NOTE — Progress Notes (Signed)
 Tour of Duty:  Prentice JINNY Angle, RN, 04/28/24, Tour of Duty: 0700-1900  SI/HI/AVH: Denies  Self-Reported   Mood: animated  Anxiety: Denies, but Observable Depression: Denies Irritability: Denies  Broset  Violence Prevention Guidelines *See Row Information*: Moderate Violence Risk interventions implemented   LBM  Last BM Date : 04/27/24   Pain: not present  Patient Refusals (including Rx): No  >>Shift Summary: Patient observed to be restless, anxious, and preoccupied on unit. Patient able to make needs known. Patient fixated on food requesting extra meal trays, extra snacks, even the lunches of staff. Patient observed to engage appropriately with staff and peers. Patient taking medications as prescribed. This shift, no PRN medication requested or required. No reported or observed side effects to medication. No reported or observed agitation, aggression, or other acute emotional distress. No reported or observed physical abnormalities or concerns. Patient COWS 7.  Last Vitals  Vitals Weight: 59 kg Temp: 98.2 F (36.8 C) Temp Source: Oral Pulse Rate: 89 Resp: 16 BP: 107/71 Patient Position: (not recorded)  Admission Type  Psych Admission Type (Psych Patients Only) Admission Status: Involuntary Date 72 hour document signed : (not recorded) Time 72 hour document signed : (not recorded) Provider Notified (First and Last Name) (see details for LINK to note): (not recorded)   Psychosocial Assessment  Psychosocial Assessment Patient Complaints: Appetite increase Eye Contact: Fair Facial Expression: Animated, Anxious Affect: Preoccupied, Anxious Speech: Unremarkable Interaction: Cautious, Needy Motor Activity: Fidgety, Pacing Appearance/Hygiene: Unremarkable Behavior Characteristics: Cooperative, Anxious, Restless Mood: Preoccupied, Anxious   Aggressive Behavior  Targets: (not recorded)   Thought Process  Thought Process Coherency: Circumstantial Content:  Preoccupation Delusions: None reported or observed Perception: Within Defined Limits Hallucination: None reported or observed Judgment: Limited Confusion: None  Danger to Self/Others  Danger to Self Current suicidal ideation?: Denies Description of Suicide Plan: (not recorded) Self-Injurious Behavior: (not recorded) Agreement Not to Harm Self: (not recorded) Description of Agreement: (not recorded) Danger to Others: None reported or observed
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
## 2024-04-28 NOTE — BHH Group Notes (Signed)
 Adult Psychoeducational Group Note  Date:  04/28/2024 Time:  5:12 PM  Group Topic/Focus: Pharmacy Group   Participation Level:  Did Not Attend  Participation Quality:    Affect:    Cognitive:    Insight:   Engagement in Group:    Modes of Intervention:    Additional Comments:    Annett Berle Hoyer 04/28/2024, 5:12 PM

## 2024-04-28 NOTE — Group Note (Signed)
 Date:  04/28/2024 Time:  5:39 PM  Group Topic/Focus:  Emotional Education:   The focus of this group is to discuss what feelings/emotions are, and how they are experienced. Overcoming Stress:   The focus of this group is to define stress and help patients assess their triggers.    Participation Level:  Did Not Attend  Participation Quality:    Affect:    Cognitive:    Insight:   Engagement in Group:    Modes of Intervention:    Additional Comments:    Asberry CROME Emberlee Sortino 04/28/2024, 5:39 PM

## 2024-04-28 NOTE — Plan of Care (Signed)
   Problem: Education: Goal: Emotional status will improve Outcome: Progressing Goal: Mental status will improve Outcome: Progressing

## 2024-04-28 NOTE — Group Note (Signed)
 Date:  04/28/2024 Time:  11:59 AM  Group Topic/Focus: Social work group Social work financial risk analyst in set designer promotes well-being through by assessment, diagnosis, treatment, and prevention of mental illness, substance use.    Participation Level:  Did Not Attend   Joseph Nunez 04/28/2024, 11:59 AM

## 2024-04-28 NOTE — Group Note (Signed)
 Recreation Therapy Group Note   Group Topic:Healthy Decision Making  Group Date: 04/28/2024 Start Time: 1045 End Time: 1104 Facilitators: Domanick Cuccia-McCall, LRT,CTRS Location: 500 Hall Dayroom   Group Topic: Decision Making, Problem Solving, Communication  Goal Area(s) Addresses:  Patient will effectively work with peer towards shared goal.  Patient will identify factors that guided their decision making.  Patient will pro-socially communicate ideas during group session.   Behavioral Response:   Intervention: Survival Scenario - pencil, paper  Activity: Patients were given a scenario that they were going to into space for several months and needed to bring 15 things necessary for their survival. The word survival was not defined for the patient, allowing for open interpretation and self-exploration of current values.The list of items selected was prioritized most important to least. Each patient would come up with their own list, then work together to create a combined list of 15 items with a small group of 3-5 peers. LRT discussed each persons list and how it differed from others. The debrief included discussion of priorities, good decisions versus bad decisions, and how it is important to think before acting so we can make the best decision possible. LRT tied the concept of effective communication among group members to patient's support systems outside of the hospital and its benefit post discharge.  Education: Pharmacist, Community, Priorities, Support System, Discharge Planning   Education Outcome: Acknowledges education/In group clarification/Needs additional education   Affect/Mood: N/A   Participation Level: Did not attend    Clinical Observations/Individualized Feedback:     Plan: Continue to engage patient in RT group sessions 2-3x/week.   Sherman Lipuma-McCall, LRT,CTRS  04/28/2024 2:28 PM

## 2024-04-28 NOTE — Progress Notes (Signed)
 Recreation Therapy Notes  INPATIENT RECREATION THERAPY ASSESSMENT  Patient Details Name: Joseph Nunez MRN: [id number] DOB: 1995/11/27 Today's Date: 04/28/2024       Information Obtained From: Patient  Able to Participate in Assessment/Interview: Yes  Patient Presentation:  (Pt appeared dazed at times. Pt take long pauses in answering some of the questions.)  Reason for Admission (Per Patient): Other (Comments) (police brought me)  Patient Stressors:  (None)  Coping Skills:   Isolation, Journal, Sports, TV, Music, Meditate, Deep Breathing, Art, Prayer, Avoidance  Leisure Interests (2+):  Individual - Other (Comment) (Food)  Frequency of Recreation/Participation: Other (Comment) (Daily)  Awareness of Community Resources:  Yes  Community Resources:  Research Scientist (physical Sciences), Public Affairs Consultant  Current Use: Yes  If no, Barriers?:    Expressed Interest in [employer] Information: No  County of Residence:  [place]  Patient Main Form of Transportation: Therapist, Music  Patient Strengths:  doesn't know  Patient Identified Areas of Improvement:  no  Patient Goal for Hospitalization:  doesn't know  Current SI (including self-harm):  No  Current HI:  No  Current AVH: No  Staff Intervention Plan: Group Attendance, Collaborate with Interdisciplinary Treatment Team  Consent to Intern Participation: N/A   Joseph Nunez, LRT,CTRS Asaph Serena A Maddax Palinkas-McCall 04/28/2024, 3:22 PM
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
## 2024-04-29 ENCOUNTER — Encounter (HOSPITAL_COMMUNITY): Payer: Self-pay

## 2024-04-29 DIAGNOSIS — F319 Bipolar disorder, unspecified: Principal | ICD-10-CM

## 2024-04-29 MED ORDER — QUETIAPINE FUMARATE 400 MG PO TABS
400.0000 mg | ORAL_TABLET | Freq: Every day | ORAL | Status: DC
  Administered 2024-04-29 – 2024-04-30 (×2): 400 mg via ORAL
  Filled 2024-04-29 (×2): qty 2

## 2024-04-29 NOTE — BHH Group Notes (Signed)
 Adult Psychoeducational Group Note  Date:  04/29/2024 Time:  11:48 AM  Group Topic/Focus: Physical Wellness Wellness Toolbox:   The focus of this group is to discuss various aspects of wellness, balancing those aspects and exploring ways to increase the ability to experience wellness.  Patients will create a wellness toolbox for use upon discharge.  Participation Level:  Did Not Attend  Participation Quality:    Affect:    Cognitive:    Insight:   Engagement in Group:    Modes of Intervention:    Additional Comments:    Annett Berle Hoyer 04/29/2024, 11:48 AM

## 2024-04-29 NOTE — Progress Notes (Signed)
 The patient has been waking up frequently and has been asking for a snack. Patient was observed going through the trash can outside the doorway of another peer and put the food down after being redirected. Nurse has been notified.

## 2024-04-29 NOTE — BHH Group Notes (Signed)
 Adult Psychoeducational Group Note  Date:  04/29/2024 Time:  1:16 PM  Group Topic/Focus: Emotional Wellness Emotional Education:   The focus of this group is to discuss what feelings/emotions are, and how they are experienced.  Participation Level:  Did Not Attend  Participation Quality:    Affect:    Cognitive:    Insight:   Engagement in Group:    Modes of Intervention:    Additional Comments:    Joseph Nunez 04/29/2024, 1:16 PM

## 2024-04-29 NOTE — Group Note (Signed)
 Recreation Therapy Group Note   Group Topic:Problem Solving  Group Date: 04/29/2024 Start Time: 1035 End Time: 1047 Facilitators: Zena Vitelli-McCall, LRT,CTRS Location: [address] Dayroom   Group Topic: Triggers  Goal Area(s) Addresses:  Patient will identify what a trigger is. Patient will identify what triggers them in certain situations. Patient will identify positive change associated with effective problem solving skills.   Behavioral Response:   Intervention: Worksheet  Activity: LRT and patients discussed what triggers are. Patients were then given a worksheet were they identified their three biggest triggers. Patients would then identify strategies they use to avoid/reduce exposure to each trigger. Lastly, patients would identify strategies they use to face triggers head on.    Education: Patient will understand triggers and problem solving  Education Outcome: Acknowledges understanding/In group clarification offered/Needs additional education.    Affect/Mood: N/A   Participation Level: Did not attend    Clinical Observations/Individualized Feedback:      Plan: Continue to engage patient in RT group sessions 2-3x/week.   Yanessa Hocevar-McCall, LRT,CTRS 04/29/2024 12:30 PM

## 2024-04-29 NOTE — Plan of Care (Addendum)
 Pt was out in the milieu part of the day acting appropriately. Went to [employer] and ate adequately. Not attending group activity. Denies SI/HI/SH/paranoia/AVH. However, he is heard making comments to others but denies addressing anyone present. He asked for clonidine  by name twice today. Vomited twice today as well. Will continue to monitor.

## 2024-04-29 NOTE — BH IP Treatment Plan (Signed)
 Interdisciplinary Treatment and Diagnostic Plan Update  04/29/2024 Time of Session: 1035 AM [address] Everitt Elder MRN: [id number]  Principal Diagnosis: Bipolar 1 disorder (HCC)  Secondary Diagnoses: Principal Problem:   Bipolar 1 disorder (HCC)   Current Medications:  Current Facility-Administered Medications  Medication Dose Route Frequency Provider Last Rate Last Admin   acetaminophen  (TYLENOL ) tablet 650 mg  650 mg Oral Q6H PRN Motley-Mangrum, Jadeka A, PMHNP       alum & mag hydroxide-simeth (MAALOX/MYLANTA) 200-200-20 MG/5ML suspension 30 mL  30 mL Oral Q4H PRN Motley-Mangrum, Jadeka A, PMHNP       cloNIDine  (CATAPRES ) tablet 0.1 mg  0.1 mg Oral Q4H PRN Rollene Katz, MD   0.1 mg at 04/29/24 0840   dicyclomine  (BENTYL ) tablet 20 mg  20 mg Oral Q6H PRN Rollene Katz, MD       haloperidol  (HALDOL ) tablet 5 mg  5 mg Oral TID PRN Motley-Mangrum, Jadeka A, PMHNP   5 mg at 04/28/24 1207   And   diphenhydrAMINE  (BENADRYL ) capsule 50 mg  50 mg Oral TID PRN Motley-Mangrum, Jadeka A, PMHNP   50 mg at 04/28/24 1207   haloperidol  lactate (HALDOL ) injection 5 mg  5 mg Intramuscular TID PRN Motley-Mangrum, Jadeka A, PMHNP       And   diphenhydrAMINE  (BENADRYL ) injection 50 mg  50 mg Intramuscular TID PRN Motley-Mangrum, Jadeka A, PMHNP       And   LORazepam  (ATIVAN ) injection 2 mg  2 mg Intramuscular TID PRN Motley-Mangrum, Jadeka A, PMHNP       haloperidol  lactate (HALDOL ) injection 10 mg  10 mg Intramuscular TID PRN Motley-Mangrum, Jadeka A, PMHNP       And   diphenhydrAMINE  (BENADRYL ) injection 50 mg  50 mg Intramuscular TID PRN Motley-Mangrum, Jadeka A, PMHNP       And   LORazepam  (ATIVAN ) injection 2 mg  2 mg Intramuscular TID PRN Motley-Mangrum, Jadeka A, PMHNP       hydrOXYzine  (ATARAX ) tablet 25 mg  25 mg Oral TID PRN Motley-Mangrum, Jadeka A, PMHNP   25 mg at 04/28/24 2041   loperamide  (IMODIUM ) capsule 2-4 mg  2-4 mg Oral PRN Rollene Katz, MD       magnesium   hydroxide (MILK OF MAGNESIA) suspension 30 mL  30 mL Oral Daily PRN Motley-Mangrum, Jadeka A, PMHNP       methocarbamol  (ROBAXIN ) tablet 500 mg  500 mg Oral Q8H PRN Rollene Katz, MD       naproxen  (NAPROSYN ) tablet 500 mg  500 mg Oral BID PRN Rollene Katz, MD       nicotine  (NICODERM CQ  - dosed in mg/24 hours) patch 14 mg  14 mg Transdermal Daily Trudy Carwin, NP   14 mg at 04/28/24 9180   ondansetron  (ZOFRAN -ODT) disintegrating tablet 4 mg  4 mg Oral Q6H PRN Rollene Katz, MD       propranolol  (INDERAL ) tablet 10 mg  10 mg Oral TID Motley-Mangrum, Jadeka A, PMHNP   10 mg at 04/29/24 9162   QUEtiapine  (SEROQUEL ) tablet 400 mg  400 mg Oral QHS Faunce, Alina, DO       PTA Medications: Medications Prior to Admission  Medication Sig Dispense Refill Last Dose/Taking   amphetamine -dextroamphetamine  (ADDERALL XR) 10 MG 24 hr capsule Take 10 mg by mouth daily. (Patient not taking: Reported on 04/27/2024)      hydrOXYzine  (VISTARIL ) 25 MG capsule Take 25 mg by mouth 3 (three) times daily as needed for anxiety. (Patient not taking: Reported  on 04/27/2024)      ibuprofen  (ADVIL ) 200 MG tablet Take 600 mg by mouth as needed for headache or moderate pain. (Patient not taking: Reported on 04/27/2024)      ondansetron  (ZOFRAN ) 4 MG tablet Take 1 tablet (4 mg total) by mouth every 6 (six) hours. (Patient not taking: Reported on 04/27/2024) 12 tablet 0    propranolol  (INDERAL ) 10 MG tablet Take 10 mg by mouth 3 (three) times daily. (Patient not taking: Reported on 04/27/2024)      QUEtiapine  (SEROQUEL ) 25 MG tablet Take 1 tablet (25 mg total) by mouth daily. (Patient not taking: Reported on 04/27/2024) 30 tablet 0    QUEtiapine  (SEROQUEL ) 50 MG tablet Take 1 tablet (50 mg total) by mouth at bedtime. (Patient not taking: Reported on 04/27/2024) 30 tablet 0    thiamine  (VITAMIN B-1) 100 MG tablet Take 1 tablet (100 mg total) by mouth daily. (Patient not taking: Reported on 04/27/2024) 30 tablet 0      Patient Stressors:    Patient Strengths:    Treatment Modalities: Medication Management, Group therapy, Case management,  1 to 1 session with clinician, Psychoeducation, Recreational therapy.   Physician Treatment Plan for Primary Diagnosis: Bipolar 1 disorder (HCC) Long Term Goal(s):     Short Term Goals: Ability to identify triggers associated with substance abuse/mental health issues will improve  Medication Management: Evaluate patient's response, side effects, and tolerance of medication regimen.  Therapeutic Interventions: 1 to 1 sessions, Unit Group sessions and Medication administration.  Evaluation of Outcomes: Not Progressing  Physician Treatment Plan for Secondary Diagnosis: Principal Problem:   Bipolar 1 disorder (HCC)  Long Term Goal(s):     Short Term Goals: Ability to identify triggers associated with substance abuse/mental health issues will improve     Medication Management: Evaluate patient's response, side effects, and tolerance of medication regimen.  Therapeutic Interventions: 1 to 1 sessions, Unit Group sessions and Medication administration.  Evaluation of Outcomes: Not Progressing   RN Treatment Plan for Primary Diagnosis: Bipolar 1 disorder (HCC) Long Term Goal(s): Knowledge of disease and therapeutic regimen to maintain health will improve  Short Term Goals: Ability to remain free from injury will improve, Ability to verbalize frustration and anger appropriately will improve, Ability to demonstrate self-control, Ability to participate in decision making will improve, Ability to verbalize feelings will improve, Ability to disclose and discuss suicidal ideas, Ability to identify and develop effective coping behaviors will improve, and Compliance with prescribed medications will improve  Medication Management: RN will administer medications as ordered by provider, will assess and evaluate patient's response and provide education to patient for  prescribed medication. RN will report any adverse and/or side effects to prescribing provider.  Therapeutic Interventions: 1 on 1 counseling sessions, Psychoeducation, Medication administration, Evaluate responses to treatment, Monitor vital signs and CBGs as ordered, Perform/monitor CIWA, COWS, AIMS and Fall Risk screenings as ordered, Perform wound care treatments as ordered.  Evaluation of Outcomes: Not Progressing   LCSW Treatment Plan for Primary Diagnosis: Bipolar 1 disorder (HCC) Long Term Goal(s): Safe transition to appropriate next level of care at discharge, Engage patient in therapeutic group addressing interpersonal concerns.  Short Term Goals: Engage patient in aftercare planning with referrals and resources, Increase social support, Increase ability to appropriately verbalize feelings, Increase emotional regulation, Facilitate acceptance of mental health diagnosis and concerns, Facilitate patient progression through stages of change regarding substance use diagnoses and concerns, Identify triggers associated with mental health/substance abuse issues, and Increase skills for wellness  and recovery  Therapeutic Interventions: Assess for all discharge needs, 1 to 1 time with Child psychotherapist, Explore available resources and support systems, Assess for adequacy in community support network, Educate family and significant other(s) on suicide prevention, Complete Psychosocial Assessment, Interpersonal group therapy.  Evaluation of Outcomes: Not Progressing   Progress in Treatment: Attending groups: No. Participating in groups: No. Taking medication as prescribed: Yes. Toleration medication: Yes. Family/Significant other contact made: No, will contact:  declined consents  Patient understands diagnosis: No. Discussing patient identified problems/goals with staff: Yes. Medical problems stabilized or resolved: Yes. Denies suicidal/homicidal ideation: Yes. Issues/concerns per patient  self-inventory: No.  New problem(s) identified: No, Describe:  none  New Short Term/Long Term Goal(s): detox, medication management for mood stabilization; elimination of SI thoughts; development of comprehensive mental wellness/sobriety plan   Patient Goals:  Get off drugs and see my mom  Discharge Plan or Barriers: Patient recently admitted. CSW will continue to follow and assess for appropriate referrals and possible discharge planning.    Reason for Continuation of Hospitalization: Medication stabilization Withdrawal symptoms, psychosis  Estimated Length of Stay: 5-7 days  Last 3 [place] Suicide Severity Risk Score: Flowsheet Row [facility] Most recent reading at 04/28/2024  6:10 AM ED from 04/27/2024 in [facility] Emergency Department at [facility] Most recent reading at 04/27/2024  2:15 AM ED from 01/26/2024 in [facility] Emergency Department at [facility] Most recent reading at 01/26/2024  9:24 PM  C-SSRS RISK CATEGORY No Risk No Risk No Risk    Last PHQ 2/9 Scores:     No data to display          Scribe for Treatment Team: Jenkins LULLA Primer, LCSWA 04/29/2024 11:12 AM

## 2024-04-29 NOTE — Progress Notes (Signed)
 [facility] Inpatient Psychiatry Progress Note  Date: 04/29/2024 Patient: Joseph Nunez Joseph Nunez MRN: [id number]  ASSESSMENT: On initial interview patient demonstrated disorganization, selectively answering questions and sedated. Patient makes confusing, inconsistent statements with disorganized thought process and questionable memory. His presentation is consistent with an unspecified psychosis, either substance-induced psychosis or primary thought disorder given recent UDS positive for cocaine.  Patient is currently placed on COWS protocol for history of OUD and so medication of Seroquel  has been restarted.   12/10: On assessment today, patient still disorganized with flat affect and negative features.  Will increase Seroquel  to 400 mg nightly for psychosis and continue to monitor patient's response & behaviors, plan for safe dispo towards the end of the week.  PLAN: Psychiatric Diagnoses and Treatment # Unspecified psychosis, substance-induced psychosis, primary thought disorder - Continue COWS protocol with as needed clonidine  - increase Seroquel  to 400 mg nightly   PRN's - Trazodone 50 mg at bedtime as needed for insomnia - Atarax  25 mg TID as needed for anxiety - Agitation Protocol: Haldol , Benadryl , Ativan    2. Active Medical Issues #Nicotine  withdrawal - Patient in need of nicotine  replacement; nicotine  patch 14 mg / 24 hours ordered. Smoking cessation encouraged   Other as needed medications  Tylenol  650 mg every 6 hours as needed for pain Mylanta 30 mL every 4 hours as needed for indigestion Milk of magnesia 30 mL daily as needed for constipation  Risk Assessment: Patient continues to require inpatient hospitalization for safety and stabilization of acute psychosis.  Discharge Planning: Barriers to Discharge: acute psychosis Estimated Length of Stay: 2-4 days, end of the week Predicted Discharge Location: TBD  INTERVAL HISTORY: Chart reviewed.  No significant events overnight. On assessment today, the patient reports that he is not doing good.  Patient says he has bad nausea and has been sick to his stomach.  Patient says that he signed up about 5 times today, counseled patient this is likely coming down from substances and he would likely be uncomfortable the next couple days.  Patient informed of as needed Zofran  available for nausea, patient voiced understanding.  Patient still disorganized, saying that he misses his family and hopes to speak with them soon. I asked patient who Horacio is that is in his chart, and he says that is his mother's friend and was amendable to giving Joseph Nunez a call.  Collateral: called Joseph Nunez, patient's mother [phone], call could not be completed.  Attempted to reach [facility] [phone], no answer.  PRN's in the last 24 hours include: none  Physical Examination:  Vitals and nursing note reviewed MSK: Normal gait and station  MENTAL STATUS EXAM:  Appearance: Appropriate for environment, Caucasian male prone in bed  Behavior: Poor eye contact, psychomotor retardation  Attitude: Polite  Speech: Decreased volume, somewhat garbled  Mood: not good  Affect: flat  Thought Process: Disorganized  Thought Content: WNL  SI/HI: denies  Perceptions: Denies AVH, does not appear to be responding to internal stimuli  Judgement: Poor  Insight: Poor  Fund of Knowledge: WNL   Lab Results:  Admission on 04/27/2024  Component Date Value Ref Range Status   TSH 04/28/2024 2.330  0.350 - 4.500 uIU/mL Final   Cholesterol 04/28/2024 147  0 - 200 mg/dL Final   Triglycerides 87/90/7974 112  <150 mg/dL Final   HDL 87/90/7974 58  >40 mg/dL Final   Total CHOL/HDL Ratio 04/28/2024 2.6  RATIO Final   VLDL 04/28/2024 22  0 - 40 mg/dL  Final   LDL Cholesterol 04/28/2024 67  0 - 99 mg/dL Final     Vitals: Blood pressure 107/71, pulse 89, temperature 98.2 F (36.8 C), temperature source Oral, resp. rate 16,  height 5' 8 (1.727 m), weight 59 kg, SpO2 100%.   Alfornia Light, DO PGY-1, Psychiatry Residency  04/29/2024, 8:10 AM

## 2024-04-29 NOTE — Group Note (Signed)
 Date:  04/29/2024 Time:  3:43 PM  Group Topic/Focus:   Self Care:   The focus of this group is to help patients understand the importance of self-care and sleep hygiene in order to improve or restore emotional, physical, spiritual, interpersonal, and financial health.    Additional Comments:  patient did not attend  Powell JAYSON Sharps 04/29/2024, 3:43 PM

## 2024-04-29 NOTE — BHH Group Notes (Signed)
 Adult Psychoeducational Group Note  Date:  04/29/2024 Time:  10:12 AM  Group Topic/Focus: Goals Group Goals Group:   The focus of this group is to help patients establish daily goals to achieve during treatment and discuss how the patient can incorporate goal setting into their daily lives to aide in recovery.  Participation Level:  Did Not Attend  Participation Quality:    Affect:    Cognitive:    Insight:   Engagement in Group:    Modes of Intervention:    Additional Comments:    Annett Redman Ramsay 04/29/2024, 10:12 AM
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
## 2024-04-29 NOTE — BH Assessment (Signed)
(  Sleep Hours) - 5.25 (Any PRNs that were needed, meds refused, or side effects to meds)-  (Any disturbances and when (visitation, over night)- None (Concerns raised by the patient)- None (SI/HI/AVH)- Denies with mild confusion

## 2024-04-29 NOTE — BHH Group Notes (Signed)
 BHH Group Notes:  (Nursing/MHT/Case Management/Adjunct)  Date:  04/29/2024  Time:  8:51 PM  Type of Therapy:  Psychoeducational Skills  Participation Level:  Did Not Attend  Participation Quality:  Did Not Attend   Affect:  Did Not Attend   Cognitive:  Did Not Attend  Insight:  None  Engagement in Group:  Did Not Attend  Modes of Intervention:  Did Not Attend  Summary of Progress/Problems: Patient did not attend group this evening.   Romelo Sciandra S 04/29/2024, 8:51 PM

## 2024-04-30 LAB — HEMOGLOBIN A1C
Hgb A1c MFr Bld: 5.2 % (ref 4.8–5.6)
Mean Plasma Glucose: 103 mg/dL

## 2024-04-30 NOTE — BH Assessment (Signed)
(  Sleep Hours) - 7.25 (Any PRNs that were needed, meds refused, or side effects to meds)-  (Any disturbances and when (visitation, over night)- None (Concerns raised by the patient)- None (SI/HI/AVH)- Denies

## 2024-04-30 NOTE — Progress Notes (Signed)
 [facility] Inpatient Psychiatry Progress Note  Date: 04/30/2024 Patient: Joseph Nunez MRN: [id number]  ASSESSMENT: On initial interview patient demonstrated disorganization, selectively answering questions and sedated. Patient makes confusing, inconsistent statements with disorganized thought process and questionable memory. His presentation is consistent with an unspecified psychosis, either substance-induced psychosis or primary thought disorder given recent UDS positive for cocaine.  Patient is currently on COWS protocol for history of OUD and home medication of Seroquel  has been restarted.   12/11: On assessment today, patient is mildly disorganized thought process with flat affect and negative features, though much less sedated, seen pacing around in unit requesting discharge. Will continue Seroquel  400 mg nightly for psychosis and continue to monitor patient's response & behaviors, plan for safe dispo to shelter tomorrow. Lipid panel back WNL.   PLAN: Psychiatric Diagnoses and Treatment # Unspecified psychosis; substance-induced psychosis, v. primary thought disorder - Continue COWS protocol with as needed clonidine  - Continue Seroquel  400 mg nightly   PRN's - Trazodone 50 mg at bedtime as needed for insomnia - Atarax  25 mg TID as needed for anxiety - Agitation Protocol: Haldol , Benadryl , Ativan    2. Active Medical Issues #Nicotine  withdrawal - Patient in need of nicotine  replacement; nicotine  patch 14 mg / 24 hours ordered. Smoking cessation encouraged   Other as needed medications  Tylenol  650 mg every 6 hours as needed for pain Mylanta 30 mL every 4 hours as needed for indigestion Milk of magnesia 30 mL daily as needed for constipation  Risk Assessment: Patient continues to require inpatient hospitalization for safety and stabilization of acute psychosis.  Discharge Planning: Barriers to Discharge: acute psychosis Estimated Length of  Stay: 1-2 days, pending d/c friday Predicted Discharge Location: TBD, shelter  INTERVAL HISTORY: Chart reviewed. No significant events overnight. On assessment today, the patient reports that he is not doing good.  Patient says wants to discharge.  When asked where patient will go, he says home.  Patient says to avoid rehospitalization he will attend his outpatient follow-up appointments.  Discussed with patient that he does not yet have outpatient follow-up scheduled, and he will need to be arrangements with LCSW to have these appointment scheduled before we can discharge patient.  Patient says his nausea/vomiting has significantly improved today, and denies any other somatic complaints.  PRN's in the last 24 hours include: Mylanta, Atarax   Physical Examination:  Vitals and nursing note reviewed MSK: Normal gait and station  MENTAL STATUS EXAM:  Appearance: young adult white male pacing in unit, interviewed in his room  Behavior: good eye contact, psychomotor agitation in the form of restlessness, pacing  Attitude: Polite  Speech: Decreased volume, somewhat garbled  Mood: anxious  Affect: flat  Thought Process: Disorganized, thought blocking  Thought Content: WNL  SI/HI: denies  Perceptions: Denies AVH, minimally responding to internal stimuli  Judgement: Poor  Insight: Poor  Fund of Knowledge: WNL   Lab Results:  Admission on 04/27/2024  Component Date Value Ref Range Status   TSH 04/28/2024 2.330  0.350 - 4.500 uIU/mL Final   Cholesterol 04/28/2024 147  0 - 200 mg/dL Final   Triglycerides 87/90/7974 112  <150 mg/dL Final   HDL 87/90/7974 58  >40 mg/dL Final   Total CHOL/HDL Ratio 04/28/2024 2.6  RATIO Final   VLDL 04/28/2024 22  0 - 40 mg/dL Final   LDL Cholesterol 04/28/2024 67  0 - 99 mg/dL Final   Hgb J8r MFr Bld 04/28/2024 5.2  4.8 - 5.6 %  Final   Mean Plasma Glucose 04/28/2024 103  mg/dL Final     Vitals: Blood pressure 107/64, pulse 63, temperature 98.2 F (36.8  C), temperature source Oral, resp. rate 16, height 5' 8 (1.727 m), weight 59 kg, SpO2 100%.   Alfornia Light, DO PGY-1, Psychiatry Residency  04/30/2024, 5:53 AM

## 2024-04-30 NOTE — Progress Notes (Signed)
°   04/29/24 2045  Psych Admission Type (Psych Patients Only)  Admission Status Involuntary  Psychosocial Assessment  Patient Complaints Substance abuse  Eye Contact Fair  Facial Expression Flat  Affect Preoccupied  Speech Soft  Interaction Avoidant;Guarded  Motor Activity Other (Comment) (WNL)  Appearance/Hygiene In scrubs  Behavior Characteristics Fidgety;Unwilling to participate  Mood Pleasant  Thought Process  Coherency Blocking;Disorganized  Content Preoccupation  Delusions None reported or observed  Perception WDL  Hallucination None reported or observed  Judgment Limited  Confusion Mild  Danger to Self  Current suicidal ideation?  (Denies)  Agreement Not to Harm Self Yes  Description of Agreement Notify Staff  Danger to Others  Danger to Others None reported or observed

## 2024-04-30 NOTE — Plan of Care (Signed)
   Problem: Education: Goal: Emotional status will improve Outcome: Progressing Goal: Mental status will improve Outcome: Progressing

## 2024-04-30 NOTE — BHH Group Notes (Signed)
 Adult Psychoeducational Group Note  Date:  04/30/2024 Time:  1:56 PM  Group Topic/Focus: Kellin Group   Participation Level:  Did Not Attend  Joseph Nunez 04/30/2024, 1:56 PM

## 2024-04-30 NOTE — Group Note (Signed)
 Recreation Therapy Group Note   Group Topic:Relaxation  Group Date: 04/30/2024 Start Time: 1010 End Time: 1048 Facilitators: Finnegan Gatta-McCall, LRT,CTRS Location: [address] Dayroom   Group Topic: Music Therapy  Goal Area(s) Addresses:  Patient will identify how music can be used for stress management. Patient will identify benefits of using stress management post d/c.  Behavioral Response:   Intervention: Music  Activity: Patients were given the opportunity to pick songs that are influential and impact them. Patients had to choose songs that were clean and appropriate during group session.    Education:  Stress Management, Discharge Planning.   Education Outcome: Acknowledges Education   Affect/Mood: N/A   Participation Level: Did not attend    Clinical Observations/Individualized Feedback:      Plan: Continue to engage patient in RT group sessions 2-3x/week.   Danesha Kirchoff-McCall, LRT,CTRS 04/30/2024 11:41 AM

## 2024-04-30 NOTE — Progress Notes (Signed)
°(  Sleep Hours) - 7  (Any PRNs that were needed, meds refused, or side effects to meds)- Hydroxyzine  & Milk of Magnesium   (Any disturbances and when (visitation, over night)- none  (Concerns raised by the patient)- none  (SI/HI/AVH)- denies  COW = 3

## 2024-04-30 NOTE — Plan of Care (Signed)
   Problem: Education: Goal: Knowledge of [facility] Education information/materials will improve Outcome: Progressing Goal: Emotional status will improve Outcome: Progressing Goal: Mental status will improve Outcome: Progressing Goal: Verbalization of understanding the information provided will improve Outcome: Progressing

## 2024-04-30 NOTE — Progress Notes (Signed)
°   04/30/24 2040  Psych Admission Type (Psych Patients Only)  Admission Status Involuntary  Psychosocial Assessment  Patient Complaints Substance abuse  Eye Contact Fair  Facial Expression Flat  Affect Preoccupied  Speech Soft  Interaction Guarded  Motor Activity Other (Comment) (WDL)  Appearance/Hygiene In scrubs  Behavior Characteristics Cooperative  Mood Preoccupied;Suspicious  Thought Process  Coherency Disorganized  Content Preoccupation  Delusions None reported or observed  Perception WDL  Hallucination None reported or observed  Judgment Limited  Confusion Mild  Danger to Self  Current suicidal ideation? Denies  Danger to Others  Danger to Others None reported or observed

## 2024-04-30 NOTE — BHH Group Notes (Signed)
 Adult Psychoeducational Group Note  Date:  04/30/2024 Time:  9:51 AM  Group Topic/Focus:  Goals Group:   The focus of this group is to help patients establish daily goals to achieve during treatment and discuss how the patient can incorporate goal setting into their daily lives to aide in recovery. Orientation:   The focus of this group is to educate the patient on the purpose and policies of crisis stabilization and provide a format to answer questions about their admission.  The group details unit policies and expectations of patients while admitted.  Participation Level:  Did Not Attend  Participation Quality:    Affect:    Cognitive:    Insight:   Engagement in Group:    Modes of Intervention:    Additional Comments:    Jamelle Cassondra KIDD 04/30/2024, 9:51 AM

## 2024-04-30 NOTE — Plan of Care (Signed)
   Problem: Education: Goal: Emotional status will improve Outcome: Not Progressing Goal: Mental status will improve Outcome: Not Progressing

## 2024-04-30 NOTE — BHH Group Notes (Signed)
 Adult Psychoeducational Group Note  Date:  04/30/2024 Time:  4:55 PM  Group Topic/Focus: Occupational Therapy   Participation Level:  Active  Participation Quality:  Appropriate  Affect:  Appropriate  Cognitive:  Appropriate  Insight: Appropriate  Engagement in Group:  Engaged  Modes of Intervention:  Activity and Clarification  Additional Comments:    Ronnell Puller 04/30/2024, 4:55 PM

## 2024-04-30 NOTE — BHH Group Notes (Signed)
 Adult Psychoeducational Group Note  Date:  04/30/2024 Time:  11:45 AM  Group Topic/Focus: Nutrition group   Participation Level:  Not Attended    Joseph Nunez 04/30/2024, 11:45 AM

## 2024-04-30 NOTE — Group Note (Signed)
 Date:  04/30/2024 Time:  8:56 PM  Group Topic/Focus:  Wrap-Up Group:   The focus of this group is to help patients review their daily goal of treatment and discuss progress on daily workbooks.    Participation Level:  Did Not Attend   Joseph Nunez 04/30/2024, 8:56 PM

## 2024-05-01 MED ORDER — QUETIAPINE FUMARATE 400 MG PO TABS: 400.0000 mg | ORAL_TABLET | Freq: Every day | ORAL | 0 refills | Status: AC

## 2024-05-01 NOTE — Group Note (Signed)
 Date:  05/01/2024 Time:  10:24 AM  Group Topic/Focus: recreational therapy played finished the lyric  Guess the Lyrics can be a fun and effective tool for mental health and critical thinking by engaging individuals with emotional content, boosting memory, and fostering empathy. By guessing missing lyrics from songs that address themes like resilience, love, or struggle, participants can connect with their own emotions, reflect on their personal experiences, and practice cognitive flexibility. Discussing the meaning of the lyrics afterward encourages deeper self-awareness and perspective-taking, promoting mental well-being. This activity also offers a creative outlet for self-expression and can serve as a conversation starter on important topics like vulnerability, hope, and personal growth.    Participation Level:  Did Not Attend  Joseph Nunez 05/01/2024, 10:24 AM

## 2024-05-01 NOTE — Progress Notes (Signed)
 Pt discharged to lobby. Pt was stable and appreciative at that time. All papers and prescriptions were given and valuables returned. Verbal understanding expressed. Denies SI/HI and A/VH. Pt given opportunity to express concerns and ask questions.

## 2024-05-01 NOTE — TOC Transition Note (Signed)
 05/01/2024  Joseph Nunez DOB: 08/09/1995 MRN: [id number]   RIDER WAIVER AND RELEASE OF LIABILITY  For the purposes of helping with transportation needs, [facility] partners with outside transportation providers (taxi companies, [employer], catering manager.) to give [place] patients or other approved people the choice of on-demand rides Public Librarian) to our buildings for non-emergency visits.  By using [employer], I, the person signing this document, on behalf of myself and/or any legal minors (in my care using the [employer]), agree:  Science Writer given to me are supplied by independent, outside transportation providers who do not work for, or have any affiliation with, [employer]. [place] is not a transportation company. [facility] has no control over the quality or safety of the rides I get using [employer]. [place] has no control over whether any outside ride will happen on time or not. [facility] gives no guarantee on the reliability, quality, safety, or availability on any rides, or that no mistakes will happen. I know and accept that traveling by vehicle (car, truck, SVU, fleeta, bus, taxi, etc.) has risks of serious injuries such as disability, being paralyzed, and death. I know and agree the risk of using [employer] is mine alone, and not [employer]. [employer] are provided as is and as are available. The transportation providers are in charge for all inspections and care of the vehicles used to provide these rides. I agree not to take legal action against [facility], its agents, employees, officers, directors, representatives, insurers, attorneys, assigns, successors, subsidiaries, and affiliates at any time for any reasons related directly or indirectly to using [employer]. I also agree not to take legal action against [facility] or its affiliates for any injury, death, or damage to property caused by or  related to using [employer]. I have read this Waiver and Release of Liability, and I understand the terms used in it and their legal meaning. This Waiver is freely and voluntarily given with the understanding that my right (or any legal minors) to legal action against  relating to [employer] is knowingly given up to use these services.   I attest that I read the Ride Waiver and Release of Liability to [facility] Everitt Elder, gave Mr. Sander Remedios the opportunity to ask questions and answered the questions asked (if any). I affirm that [facility] Everitt Elder then provided consent for assistance with transportation.

## 2024-05-01 NOTE — Progress Notes (Signed)
°  [facility] Adult Case Management Discharge Plan :  Will you be returning to the same living situation after discharge:  Yes,  Pt will return to [facility] and be provided with shelter information. At discharge, do you have transportation home?: Yes,  Pt will be transported via [employer]. Do you have the ability to pay for your medications: No.  Pt does not have access to monies and does not have insurance.  Release of information consent forms completed and in the chart;  Patient's signature needed at discharge.  Patient to Follow up at:  Follow-up Information     [facility] [facility] Go on 05/05/2024.   Specialty: Professional Counselor Why: Please go to this provider on 05/05/24 at 9:00 am to register for services.  At this time, you will be scheduled for a clinical assessment, in order to obtain a therapy appointment.   You may also go Monday thru Friday, from 9 am to 1 pm to register for services. Contact information: [address]  [address] [place] [address] [phone]         [facility]. Go on 05/12/2024.   Specialty: Behavioral Health Why: Please go to this provider on 05/12/24 at 7:00 am for an initial assessment, to obtain medication management services.  You may also go Monday through Friday, arrive by 7:00 am for your assessment. Contact information: [address] [place]  [address] [phone]                Next level of care provider has access to [facility] Link:no  Safety Planning and Suicide Prevention discussed: Yes,  Completed with patient at discharge.  Pt does not endorse any SI/HI/DI/SIB at present and understood ways to address any future SI.     Has patient been referred to the Quitline?: Patient refused referral for treatment  Patient has been referred for addiction treatment: Patient refused referral for treatment.  Derick JONELLE Blanch, LCSW 05/01/2024, 9:00 AM

## 2024-05-01 NOTE — BHH Suicide Risk Assessment (Signed)
 [facility] Discharge Suicide Risk Assessment   Principal Problem: Bipolar 1 disorder [facility]) Discharge Diagnoses: Principal Problem:   Bipolar 1 disorder (HCC)  Demographic Factors:  Male, Caucasian, Low socioeconomic status, Living alone, and Unemployed  Loss Factors: NA  Historical Factors: Impulsivity  Risk Reduction Factors:   Positive social support  Continued Clinical Symptoms:  Bipolar disorder; ongoing drug use  Cognitive Features That Contribute To Risk:  Decreased executive functioning    Suicide Risk:  Mild:  Suicidal ideation of limited frequency, intensity, duration, and specificity.  There are no identifiable plans, no associated intent, mild dysphoria and related symptoms, good self-control (both objective and subjective assessment), few other risk factors, and identifiable protective factors, including available and accessible social support.   Follow-up Information     [facility] [facility] Go on 05/05/2024.   Specialty: Professional Counselor Why: Please go to this provider on 05/05/24 at 9:00 am to register for services.  At this time, you will be scheduled for a clinical assessment, in order to obtain a therapy appointment.   You may also go Monday thru Friday, from 9 am to 1 pm to register for services. Contact information: [address]  [address] [place] [address] [phone]         [facility]. Go on 05/12/2024.   Specialty: Behavioral Health Why: Please go to this provider on 05/12/24 at 7:00 am for an initial assessment, to obtain medication management services.  You may also go Monday through Friday, arrive by 7:00 am for your assessment. Contact information: [address] [place]  [address] [phone]                 Oliva DELENA Salmon, DO 05/01/2024, 10:33 AM
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
